# Patient Record
Sex: Male | Born: 1955 | Race: White | Hispanic: No | Marital: Married | Smoking: Former smoker
Health system: Southern US, Community
[De-identification: ages and names within clinical notes are randomized; demographics above are authoritative.]

## PROBLEM LIST (undated history)

## (undated) DIAGNOSIS — I10 Essential (primary) hypertension: Secondary | ICD-10-CM

## (undated) DIAGNOSIS — E785 Hyperlipidemia, unspecified: Secondary | ICD-10-CM

## (undated) DIAGNOSIS — E291 Testicular hypofunction: Secondary | ICD-10-CM

## (undated) DIAGNOSIS — Z87442 Personal history of urinary calculi: Secondary | ICD-10-CM

## (undated) DIAGNOSIS — R7303 Prediabetes: Secondary | ICD-10-CM

## (undated) DIAGNOSIS — C3490 Malignant neoplasm of unspecified part of unspecified bronchus or lung: Secondary | ICD-10-CM

## (undated) DIAGNOSIS — E559 Vitamin D deficiency, unspecified: Secondary | ICD-10-CM

## (undated) HISTORY — DX: Vitamin D deficiency, unspecified: E55.9

## (undated) HISTORY — DX: Prediabetes: R73.03

## (undated) HISTORY — DX: Hyperlipidemia, unspecified: E78.5

## (undated) HISTORY — PX: APPENDECTOMY: SHX54

## (undated) HISTORY — DX: Essential (primary) hypertension: I10

## (undated) HISTORY — DX: Testicular hypofunction: E29.1

---

## 1998-01-03 ENCOUNTER — Ambulatory Visit (HOSPITAL_COMMUNITY): Admission: RE | Admit: 1998-01-03 | Discharge: 1998-01-03 | Payer: Self-pay | Admitting: Internal Medicine

## 1999-10-10 ENCOUNTER — Ambulatory Visit (HOSPITAL_COMMUNITY): Admission: RE | Admit: 1999-10-10 | Discharge: 1999-10-10 | Payer: Self-pay | Admitting: Internal Medicine

## 1999-10-10 ENCOUNTER — Encounter: Payer: Self-pay | Admitting: Internal Medicine

## 2000-10-12 ENCOUNTER — Encounter: Payer: Self-pay | Admitting: Internal Medicine

## 2000-10-12 ENCOUNTER — Ambulatory Visit (HOSPITAL_COMMUNITY): Admission: RE | Admit: 2000-10-12 | Discharge: 2000-10-12 | Payer: Self-pay | Admitting: Family Medicine

## 2001-10-25 ENCOUNTER — Ambulatory Visit (HOSPITAL_COMMUNITY): Admission: RE | Admit: 2001-10-25 | Discharge: 2001-10-25 | Payer: Self-pay | Admitting: Internal Medicine

## 2001-10-25 ENCOUNTER — Encounter: Payer: Self-pay | Admitting: Internal Medicine

## 2002-03-28 ENCOUNTER — Encounter (INDEPENDENT_AMBULATORY_CARE_PROVIDER_SITE_OTHER): Payer: Self-pay | Admitting: *Deleted

## 2002-03-28 ENCOUNTER — Ambulatory Visit (HOSPITAL_COMMUNITY): Admission: RE | Admit: 2002-03-28 | Discharge: 2002-03-28 | Payer: Self-pay | Admitting: Gastroenterology

## 2002-12-20 ENCOUNTER — Encounter: Payer: Self-pay | Admitting: Internal Medicine

## 2002-12-20 ENCOUNTER — Ambulatory Visit (HOSPITAL_COMMUNITY): Admission: AD | Admit: 2002-12-20 | Discharge: 2002-12-20 | Payer: Self-pay | Admitting: Internal Medicine

## 2003-12-26 ENCOUNTER — Ambulatory Visit (HOSPITAL_COMMUNITY): Admission: RE | Admit: 2003-12-26 | Discharge: 2003-12-26 | Payer: Self-pay | Admitting: Internal Medicine

## 2004-12-29 ENCOUNTER — Ambulatory Visit (HOSPITAL_COMMUNITY): Admission: RE | Admit: 2004-12-29 | Discharge: 2004-12-29 | Payer: Self-pay | Admitting: Internal Medicine

## 2006-03-10 ENCOUNTER — Ambulatory Visit (HOSPITAL_COMMUNITY): Admission: RE | Admit: 2006-03-10 | Discharge: 2006-03-10 | Payer: Self-pay | Admitting: Internal Medicine

## 2007-05-31 ENCOUNTER — Ambulatory Visit: Admission: RE | Admit: 2007-05-31 | Discharge: 2007-05-31 | Payer: Self-pay | Admitting: Internal Medicine

## 2007-07-06 ENCOUNTER — Ambulatory Visit: Payer: Self-pay | Admitting: Gastroenterology

## 2007-07-11 LAB — HM COLONOSCOPY

## 2007-07-13 ENCOUNTER — Ambulatory Visit: Payer: Self-pay | Admitting: Gastroenterology

## 2007-08-02 ENCOUNTER — Ambulatory Visit: Payer: Self-pay | Admitting: Gastroenterology

## 2007-08-02 ENCOUNTER — Encounter: Payer: Self-pay | Admitting: Gastroenterology

## 2007-08-02 DIAGNOSIS — D122 Benign neoplasm of ascending colon: Secondary | ICD-10-CM | POA: Insufficient documentation

## 2007-08-10 ENCOUNTER — Ambulatory Visit: Payer: Self-pay | Admitting: Gastroenterology

## 2007-08-10 LAB — CONVERTED CEMR LAB
Fecal Occult Blood: NEGATIVE
OCCULT 4: NEGATIVE

## 2007-08-31 DIAGNOSIS — F17200 Nicotine dependence, unspecified, uncomplicated: Secondary | ICD-10-CM | POA: Insufficient documentation

## 2007-08-31 DIAGNOSIS — Z87898 Personal history of other specified conditions: Secondary | ICD-10-CM | POA: Insufficient documentation

## 2007-08-31 DIAGNOSIS — E782 Mixed hyperlipidemia: Secondary | ICD-10-CM | POA: Insufficient documentation

## 2007-08-31 DIAGNOSIS — N200 Calculus of kidney: Secondary | ICD-10-CM | POA: Insufficient documentation

## 2007-08-31 DIAGNOSIS — F1010 Alcohol abuse, uncomplicated: Secondary | ICD-10-CM | POA: Insufficient documentation

## 2007-08-31 DIAGNOSIS — Z87448 Personal history of other diseases of urinary system: Secondary | ICD-10-CM | POA: Insufficient documentation

## 2007-08-31 DIAGNOSIS — Z872 Personal history of diseases of the skin and subcutaneous tissue: Secondary | ICD-10-CM | POA: Insufficient documentation

## 2008-07-10 ENCOUNTER — Ambulatory Visit: Admission: RE | Admit: 2008-07-10 | Discharge: 2008-07-10 | Payer: Self-pay | Admitting: Internal Medicine

## 2009-09-09 ENCOUNTER — Ambulatory Visit (HOSPITAL_COMMUNITY): Admission: RE | Admit: 2009-09-09 | Discharge: 2009-09-09 | Payer: Self-pay | Admitting: Internal Medicine

## 2010-09-09 ENCOUNTER — Ambulatory Visit (HOSPITAL_COMMUNITY)
Admission: RE | Admit: 2010-09-09 | Discharge: 2010-09-09 | Payer: Self-pay | Source: Home / Self Care | Attending: Internal Medicine | Admitting: Internal Medicine

## 2010-12-23 NOTE — Assessment & Plan Note (Signed)
[facility]                         GASTROENTEROLOGY OFFICE NOTE   Ruben Mccormick, Ruben Mccormick                           MRN:          [id number]  DATE:07/06/2007                            DOB:          1956-06-02    REASON FOR CONSULTATION:  Heme-positive stool.   HISTORY:  Ruben Mccormick is a 55 year old white male who is referred through  the courtesy of Dr. Lucky Cowboy for evaluation.  Routine testing  demonstrated a heme-positive stool.  Ruben Mccormick has no GI complaints,  including change in bowel habits, abdominal pain, melena or  hematochezia.  He is on no gastric irritants including non-steroidals.  He does have a history of an adenomatous polyp which was removed in  2003.   PAST MEDICAL/SURGICAL HISTORY:  1. Pertinent for kidney stones.  2. He is status post appendectomy.  3. Status post herniorrhaphy.   FAMILY HISTORY:  Pertinent for his father with diabetes.   CURRENT MEDICATIONS:  His only medication is penicillin recently for a  toothache.   ALLERGIES:  No known drug allergies.   SOCIAL HISTORY:  He smokes one pack a day.  He drinks two or three times  a week.  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   REVIEW OF SYSTEMS:  Was reviewed and is negative.   PHYSICAL EXAMINATION:  HEENT: EOMI.  PERRLA.  Sclerae are anicteric.  Conjunctivae are pink.  NECK:  Supple without thyromegaly, adenopathy or carotid bruits.  CHEST:  Clear to auscultation and percussion without adventitious  sounds.  CARDIAC:  Regular rhythm; normal S1 S2.  There are no murmurs, gallops  or rubs.  ABDOMEN:  Bowel sounds are normoactive.  Abdomen is soft, nontender and  nondistended.  There are no abdominal masses, tenderness, splenic  enlargement or hepatomegaly.  EXTREMITIES:  Full range of motion.  No cyanosis, clubbing or edema.  RECTAL:  Deferred.   LABORATORY DATA:  On May 31, 2007, hemoglobin was 17, hematocrit  48.9.   IMPRESSION:  1. Heme-positive  stool.  Colonic sources including polyps and neoplasm      and hemorrhoids need to be ruled out.  A proximal gastrointestinal      bleeding source secondary to active peptic ulcer disease and      arteriovenous malformations are other considerations.  2. History of adenomatous colon polyp.   RECOMMENDATION:  Colonoscopy.  If negative, I will obtain follow-up  Hemoccults.     Barbette Hair. Arlyce Dice, MD,FACG  Electronically Signed    RDK/MedQ  DD: 07/06/2007  DT: 07/06/2007  Job #: [id number]   cc:   Lucky Cowboy, M.D.

## 2011-09-14 ENCOUNTER — Ambulatory Visit (HOSPITAL_COMMUNITY)
Admission: RE | Admit: 2011-09-14 | Discharge: 2011-09-14 | Disposition: A | Discharge status: Home / Self Care | Payer: Managed Care, Other (non HMO) | Source: Ambulatory Visit | Attending: Internal Medicine | Admitting: Internal Medicine

## 2011-09-14 ENCOUNTER — Other Ambulatory Visit (HOSPITAL_COMMUNITY): Payer: Self-pay | Admitting: Internal Medicine

## 2011-09-14 DIAGNOSIS — I10 Essential (primary) hypertension: Secondary | ICD-10-CM

## 2011-09-14 DIAGNOSIS — Z0000 Encounter for general adult medical examination without abnormal findings: Secondary | ICD-10-CM | POA: Insufficient documentation

## 2012-07-06 ENCOUNTER — Encounter: Payer: Self-pay | Admitting: Gastroenterology

## 2012-09-15 ENCOUNTER — Other Ambulatory Visit (HOSPITAL_COMMUNITY): Payer: Self-pay | Admitting: Internal Medicine

## 2012-09-15 ENCOUNTER — Ambulatory Visit (HOSPITAL_COMMUNITY)
Admission: RE | Admit: 2012-09-15 | Discharge: 2012-09-15 | Disposition: A | Discharge status: Home / Self Care | Payer: Medicare HMO | Source: Ambulatory Visit | Attending: Internal Medicine | Admitting: Internal Medicine

## 2012-09-15 DIAGNOSIS — I10 Essential (primary) hypertension: Secondary | ICD-10-CM | POA: Insufficient documentation

## 2012-09-15 DIAGNOSIS — J8410 Pulmonary fibrosis, unspecified: Secondary | ICD-10-CM | POA: Insufficient documentation

## 2013-03-07 ENCOUNTER — Encounter: Payer: Self-pay | Admitting: Gastroenterology

## 2013-07-20 ENCOUNTER — Encounter: Payer: Self-pay | Admitting: Internal Medicine

## 2013-07-24 ENCOUNTER — Encounter: Payer: Self-pay | Admitting: Emergency Medicine

## 2013-07-24 ENCOUNTER — Ambulatory Visit (INDEPENDENT_AMBULATORY_CARE_PROVIDER_SITE_OTHER): Payer: Managed Care, Other (non HMO) | Admitting: Emergency Medicine

## 2013-07-24 VITALS — BP 124/80 | HR 78 | Temp 98.6°F | Resp 18 | Wt 168.0 lb

## 2013-07-24 DIAGNOSIS — F17200 Nicotine dependence, unspecified, uncomplicated: Secondary | ICD-10-CM

## 2013-07-24 DIAGNOSIS — E782 Mixed hyperlipidemia: Secondary | ICD-10-CM

## 2013-07-24 DIAGNOSIS — M79609 Pain in unspecified limb: Secondary | ICD-10-CM

## 2013-07-24 DIAGNOSIS — E559 Vitamin D deficiency, unspecified: Secondary | ICD-10-CM

## 2013-07-24 DIAGNOSIS — E291 Testicular hypofunction: Secondary | ICD-10-CM

## 2013-07-24 DIAGNOSIS — Z79899 Other long term (current) drug therapy: Secondary | ICD-10-CM

## 2013-07-24 LAB — CBC WITH DIFFERENTIAL/PLATELET
Eosinophils Absolute: 0.3 10*3/uL (ref 0.0–0.7)
Eosinophils Relative: 3 % (ref 0–5)
Lymphocytes Relative: 24 % (ref 12–46)
Lymphs Abs: 2.5 10*3/uL (ref 0.7–4.0)
MCV: 101.3 fL — ABNORMAL HIGH (ref 78.0–100.0)
Monocytes Relative: 10 % (ref 3–12)
Platelets: 224 10*3/uL (ref 150–400)
RDW: 13.3 % (ref 11.5–15.5)

## 2013-07-24 NOTE — Patient Instructions (Addendum)
Myalgia, Adult Myalgia is the medical term for muscle pain. It is a symptom of many things. Nearly everyone at some time in their life has this. The most common cause for muscle pain is overuse or straining and more so when you are not in shape. Injuries and muscle bruises cause myalgias. Muscle pain without a history of injury can also be caused by a virus. It frequently comes along with the flu. Myalgia not caused by muscle strain can be present in a large number of infectious diseases. Some autoimmune diseases like lupus and fibromyalgia can cause muscle pain. Myalgia may be mild, or severe. SYMPTOMS  The symptoms of myalgia are simply muscle pain. Most of the time this is short lived and the pain goes away without treatment. DIAGNOSIS  Myalgia is diagnosed by your caregiver by taking your history. This means you tell him when the problems began, what they are, and what has been happening. If this has not been a long term problem, your caregiver may want to watch for a while to see what will happen. If it has been long term, they may want to do additional testing. TREATMENT  The treatment depends on what the underlying cause of the muscle pain is. Often anti-inflammatory medications will help. HOME CARE INSTRUCTIONS  If the pain in your muscles came from overuse, slow down your activities until the problems go away.  Myalgia from overuse of a muscle can be treated with alternating hot and cold packs on the muscle affected or with cold for the first couple days. If either heat or cold seems to make things worse, stop their use.  Apply ice to the sore area for 15-20 minutes, 03-04 times per day, while awake for the first 2 days of muscle soreness, or as directed. Put the ice in a plastic bag and place a towel between the bag of ice and your skin.  Only take over-the-counter or prescription medicines for pain, discomfort, or fever as directed by your caregiver.  Regular gentle exercise may help if  you are not active.  Stretching before strenuous exercise can help lower the risk of myalgia. It is normal when beginning an exercise regimen to feel some muscle pain after exercising. Muscles that have not been used frequently will be sore at first. If the pain is extreme, this may mean injury to a muscle. SEEK MEDICAL CARE IF:  You have an increase in muscle pain that is not relieved with medication.  You begin to run a temperature.  You develop nausea and vomiting.  You develop a stiff and painful neck.  You develop a rash.  You develop muscle pain after a tick bite.  You have continued muscle pain while working out even after you are in good condition. SEEK IMMEDIATE MEDICAL CARE IF: Any of your problems are getting worse and medications are not helping. MAKE SURE YOU:   Understand these instructions.  Will watch your condition.  Will get help right away if you are not doing well or get worse. Document Released: 06/18/2006 Document Revised: 10/19/2011 Document Reviewed: 09/07/2006 [facility] Patient Information 2014 [place], [place]. Smoking Cessation, Tips for Success YOU CAN QUIT SMOKING If you are ready to quit smoking, congratulations! You have chosen to help yourself be healthier. Cigarettes bring nicotine, tar, carbon monoxide, and other irritants into your body. Your lungs, heart, and blood vessels will be able to work better without these poisons. There are many different ways to quit smoking. Nicotine gum, nicotine patches, a  nicotine inhaler, or nicotine nasal spray can help with physical craving. Hypnosis, support groups, and medicines help break the habit of smoking. Here are some tips to help you quit for good.  Throw away all cigarettes.  Clean and remove all ashtrays from your home, work, and car.  On a card, write down your reasons for quitting. Carry the card with you and read it when you get the urge to smoke.  Cleanse your body of nicotine. Drink enough  water and fluids to keep your urine clear or pale yellow. Do this after quitting to flush the nicotine from your body.  Learn to predict your moods. Do not let a bad situation be your excuse to have a cigarette. Some situations in your life might tempt you into wanting a cigarette.  Never have "just one" cigarette. It leads to wanting another and another. Remind yourself of your decision to quit.  Change habits associated with smoking. If you smoked while driving or when feeling stressed, try other activities to replace smoking. Stand up when drinking your coffee. Brush your teeth after eating. Sit in a different chair when you read the paper. Avoid alcohol while trying to quit, and try to drink fewer caffeinated beverages. Alcohol and caffeine may urge you to smoke.  Avoid foods and drinks that can trigger a desire to smoke, such as sugary or spicy foods and alcohol.  Ask people who smoke not to smoke around you.  Have something planned to do right after eating or having a cup of coffee. Take a walk or exercise to perk you up. This will help to keep you from overeating.  Try a relaxation exercise to calm you down and decrease your stress. Remember, you may be tense and nervous for the first 2 weeks after you quit, but this will pass.  Find new activities to keep your hands busy. Play with a pen, coin, or rubber band. Doodle or draw things on paper.  Brush your teeth right after eating. This will help cut down on the craving for the taste of tobacco after meals. You can try mouthwash, too.  Use oral substitutes, such as lemon drops, carrots, a cinnamon stick, or chewing gum, in place of cigarettes. Keep them handy so they are available when you have the urge to smoke.  When you have the urge to smoke, try deep breathing.  Designate your home as a nonsmoking area.  If you are a heavy smoker, ask your caregiver about a prescription for nicotine chewing gum. It can ease your withdrawal from  nicotine.  Reward yourself. Set aside the cigarette money you save and buy yourself something nice.  Look for support from others. Join a support group or smoking cessation program. Ask someone at home or at work to help you with your plan to quit smoking.  Always ask yourself, "Do I need this cigarette or is this just a reflex?" Tell yourself, "Today, I choose not to smoke," or "I do not want to smoke." You are reminding yourself of your decision to quit, even if you do smoke a cigarette. HOW WILL I FEEL WHEN I QUIT SMOKING?  The benefits of not smoking start within days of quitting.  You may have symptoms of withdrawal because your body is used to nicotine (the addictive substance in cigarettes). You may crave cigarettes, be irritable, feel very hungry, cough often, get headaches, or have difficulty concentrating.  The withdrawal symptoms are only temporary. They are strongest when you first quit  but will go away within 10 to 14 days.  When withdrawal symptoms occur, stay in control. Think about your reasons for quitting. Remind yourself that these are signs that your body is healing and getting used to being without cigarettes.  Remember that withdrawal symptoms are easier to treat than the major diseases that smoking can cause.  Even after the withdrawal is over, expect periodic urges to smoke. However, these cravings are generally short-lived and will go away whether you smoke or not. Do not smoke!  If you relapse and smoke again, do not lose hope. Most smokers quit 3 times before they are successful.  If you relapse, do not give up! Plan ahead and think about what you will do the next time you get the urge to smoke. LIFE AS A NONSMOKER: MAKE IT FOR A MONTH, MAKE IT FOR LIFE Day 1: Hang this page where you will see it every day. Day 2: Get rid of all ashtrays, matches, and lighters. Day 3: Drink water. Breathe deeply between sips. Day 4: Avoid places with smoke-filled air, such as  bars, clubs, or the smoking section of restaurants. Day 5: Keep track of how much money you save by not smoking. Day 6: Avoid boredom. Keep a good book with you or go to the movies. Day 7: Reward yourself! One week without smoking! Day 8: Make a dental appointment to get your teeth cleaned. Day 9: Decide how you will turn down a cigarette before it is offered to you. Day 10: Review your reasons for quitting. Day 11: Distract yourself. Stay active to keep your mind off smoking and to relieve tension. Take a walk, exercise, read a book, do a crossword puzzle, or try a new hobby. Day 12: Exercise. Get off the bus before your stop or use stairs instead of escalators. Day 13: Call on friends for support and encouragement. Day 14: Reward yourself! Two weeks without smoking! Day 15: Practice deep breathing exercises. Day 16: Bet a friend that you can stay a nonsmoker. Day 17: Ask to sit in nonsmoking sections of restaurants. Day 18: Hang up "No Smoking" signs. Day 19: Think of yourself as a nonsmoker. Day 20: Each morning, tell yourself you will not smoke. Day 21: Reward yourself! Three weeks without smoking! Day 22: Think of smoking in negative ways. Remember how it stains your teeth, gives you bad breath, and leaves you short of breath. Day 23: Eat a nutritious breakfast. Day 24:Do not relive your days as a smoker. Day 25: Hold a pencil in your hand when talking on the telephone. Day 26: Tell all your friends you do not smoke. Day 27: Think about how much better food tastes. Day 28: Remember, one cigarette is one too many. Day 29: Take up a hobby that will keep your hands busy. Day 30: Congratulations! One month without smoking! Give yourself a big reward. Your caregiver can direct you to community resources or hospitals for support, which may include:  Group support.  Education.  Hypnosis.  Subliminal therapy. Document Released: 04/24/2004 Document Revised: 10/19/2011 Document  Reviewed: 01/12/2013 [facility] Patient Information 2014 [place], [place].

## 2013-07-25 LAB — IRON AND TIBC
%SAT: 25 % (ref 20–55)
Iron: 91 ug/dL (ref 42–165)
UIBC: 267 ug/dL (ref 125–400)

## 2013-07-25 LAB — HEPATIC FUNCTION PANEL
ALT: 40 U/L (ref 0–53)
AST: 65 U/L — ABNORMAL HIGH (ref 0–37)
Alkaline Phosphatase: 71 U/L (ref 39–117)
Total Bilirubin: 0.5 mg/dL (ref 0.3–1.2)
Total Protein: 5.9 g/dL — ABNORMAL LOW (ref 6.0–8.3)

## 2013-07-25 LAB — LIPID PANEL
HDL: 74 mg/dL (ref >39)
LDL Cholesterol: 34 mg/dL (ref 0–99)
VLDL: 32 mg/dL (ref 0–40)

## 2013-07-25 LAB — BASIC METABOLIC PANEL WITH GFR
Creat: 0.92 mg/dL (ref 0.50–1.35)
GFR, Est Non African American: >89 mL/min
Sodium: 140 mEq/L (ref 135–145)

## 2013-07-25 LAB — TESTOSTERONE: Testosterone: 1221 ng/dL — ABNORMAL HIGH (ref 300–890)

## 2013-07-25 NOTE — Progress Notes (Signed)
   Subjective:    Patient ID: Ruben Mccormick, male    DOB: 02/23/1956, 57 y.o.   MRN: [id number]  HPI Comments: 57 yo male for cholesterol/ hypogonadism f/u. He has been eating healthier and decreasing ETOH consumption. He would like to d/c Pravastatin if labs WNL. He notes he occasionally has anterior leg pain off thighs but usually shins. He denies any trauma. He notes pain resolves on its own with some stretching. He keeps active for exercise. ABN LAB TG 355 rest were WNL at last OV.  He is still smoking cigarettes, he has decreased from 3 to 1 ppd. He notes he did not try recommendations given in past.        Hyperlipidemia Associated symptoms include myalgias.     Current Outpatient Prescriptions on File Prior to Visit  Medication Sig Dispense Refill  . aspirin 325 MG tablet Take 325 mg by mouth daily.      . Cholecalciferol (VITAMIN D-3) 5000 UNITS TABS Take 8,000 Units by mouth daily.      . pravastatin (PRAVACHOL) 40 MG tablet Take 20 mg by mouth daily.      Marland Kitchen testosterone cypionate (DEPOTESTOTERONE CYPIONATE) 100 MG/ML injection Inject 200 mg into the muscle every 14 (fourteen) days. For IM use only       No current facility-administered medications on file prior to visit.   ALLERGIES Bee venom; Ppd; and Voltaren  Past Medical History  Diagnosis Date  . Hypertension   . Hyperlipidemia   . Pre-diabetes   . Hypogonadism male   . Vitamin D deficiency     Review of Systems  Musculoskeletal: Positive for myalgias.  All other systems reviewed and are negative.    BP 124/80  Pulse 78  Temp(Src) 98.6 F (37 C) (Temporal)  Resp 18  Wt 168 lb (76.204 kg)     Objective:   Physical Exam  Nursing note and vitals reviewed. Constitutional: He is oriented to person, place, and time. He appears well-developed and well-nourished.  HENT:  Head: Normocephalic and atraumatic.  Right Ear: External ear normal.  Left Ear: External ear normal.  Nose: Nose normal.  Eyes:  Conjunctivae and EOM are normal.  Neck: Normal range of motion. Neck supple. No JVD present. No thyromegaly present.  Cardiovascular: Normal rate, regular rhythm, normal heart sounds and intact distal pulses.   Pulmonary/Chest: Effort normal and breath sounds normal.  Abdominal: Soft. Bowel sounds are normal. He exhibits no distension and no mass. There is no tenderness. There is no rebound and no guarding.  Musculoskeletal: Normal range of motion. He exhibits no edema and no tenderness.  Lymphadenopathy:    He has no cervical adenopathy.  Neurological: He is alert and oriented to person, place, and time. He has normal reflexes. No cranial nerve deficit. Coordination normal.  Skin: Skin is warm and dry.  Psychiatric: He has a normal mood and affect. His behavior is normal. Judgment and thought content normal.          Assessment & Plan:  1. Cholesterol/ hypogonadism/ Vit D deficiency- Check labs 2. Myalgias with Cholesterol RX- check labs, will conside d/c RX if labs still WNL and try dietary control 3. Tobacco dependence- counseling given, w/c if wants rx

## 2013-08-03 ENCOUNTER — Other Ambulatory Visit: Payer: Self-pay | Admitting: Internal Medicine

## 2013-09-21 ENCOUNTER — Ambulatory Visit (INDEPENDENT_AMBULATORY_CARE_PROVIDER_SITE_OTHER): Payer: Medicare HMO | Admitting: Internal Medicine

## 2013-09-21 ENCOUNTER — Encounter: Payer: Self-pay | Admitting: Internal Medicine

## 2013-09-21 VITALS — BP 132/72 | HR 84 | Temp 98.1°F | Resp 18 | Ht 67.5 in | Wt 164.2 lb

## 2013-09-21 DIAGNOSIS — Z113 Encounter for screening for infections with a predominantly sexual mode of transmission: Secondary | ICD-10-CM

## 2013-09-21 DIAGNOSIS — Z0000 Encounter for general adult medical examination without abnormal findings: Secondary | ICD-10-CM

## 2013-09-21 DIAGNOSIS — E559 Vitamin D deficiency, unspecified: Secondary | ICD-10-CM

## 2013-09-21 DIAGNOSIS — E785 Hyperlipidemia, unspecified: Secondary | ICD-10-CM

## 2013-09-21 DIAGNOSIS — R0989 Other specified symptoms and signs involving the circulatory and respiratory systems: Secondary | ICD-10-CM | POA: Insufficient documentation

## 2013-09-21 DIAGNOSIS — E349 Endocrine disorder, unspecified: Secondary | ICD-10-CM | POA: Insufficient documentation

## 2013-09-21 DIAGNOSIS — Z1212 Encounter for screening for malignant neoplasm of rectum: Secondary | ICD-10-CM

## 2013-09-21 DIAGNOSIS — R7303 Prediabetes: Secondary | ICD-10-CM | POA: Insufficient documentation

## 2013-09-21 DIAGNOSIS — I10 Essential (primary) hypertension: Secondary | ICD-10-CM

## 2013-09-21 DIAGNOSIS — R7402 Elevation of levels of lactic acid dehydrogenase (LDH): Secondary | ICD-10-CM

## 2013-09-21 DIAGNOSIS — Z79899 Other long term (current) drug therapy: Secondary | ICD-10-CM

## 2013-09-21 DIAGNOSIS — R740 Nonspecific elevation of levels of transaminase and lactic acid dehydrogenase [LDH]: Secondary | ICD-10-CM

## 2013-09-21 DIAGNOSIS — Z125 Encounter for screening for malignant neoplasm of prostate: Secondary | ICD-10-CM

## 2013-09-21 LAB — CBC WITH DIFFERENTIAL/PLATELET
Basophils Absolute: 0 10*3/uL (ref 0.0–0.1)
Basophils Relative: 0 % (ref 0–1)
EOS ABS: 0.3 10*3/uL (ref 0.0–0.7)
Eosinophils Relative: 3 % (ref 0–5)
HEMATOCRIT: 47.4 % (ref 39.0–52.0)
Hemoglobin: 16 g/dL (ref 13.0–17.0)
LYMPHS PCT: 27 % (ref 12–46)
Lymphs Abs: 2.1 10*3/uL (ref 0.7–4.0)
MCH: 33.1 pg (ref 26.0–34.0)
MCHC: 33.8 g/dL (ref 30.0–36.0)
MCV: 97.9 fL (ref 78.0–100.0)
MONOS PCT: 8 % (ref 3–12)
Monocytes Absolute: 0.6 10*3/uL (ref 0.1–1.0)
NEUTROS ABS: 4.7 10*3/uL (ref 1.7–7.7)
NEUTROS PCT: 62 % (ref 43–77)
PLATELETS: 221 10*3/uL (ref 150–400)
RBC: 4.84 MIL/uL (ref 4.22–5.81)
RDW: 13.3 % (ref 11.5–15.5)
WBC: 7.7 10*3/uL (ref 4.0–10.5)

## 2013-09-21 MED ORDER — PREDNISONE 20 MG PO TABS: 20.0000 mg | ORAL_TABLET | ORAL | Status: DC

## 2013-09-21 NOTE — Progress Notes (Signed)
Patient ID: Ruben Mccormick, male   DOB: 02/14/1956, 58 y.o.   MRN: [id number]  Annual Screening Comprehensive Examination  This very nice 58 y.o.  MWM presents for complete physical.  Patient has been followed for labile HTN, Diabetes  Prediabetes, Hyperlipidemia, and Vitamin D Deficiency.   Labile HTN predates since 2005 and for which he has been monitored expectantly with BP's remaining in normal range. Patient's BP has been controlled at home.Today's BP: 132/72 mmHg. Patient denies any cardiac symptoms as chest pain, palpitations, shortness of breath, dizziness or ankle swelling.   Patient's hyperlipidemia is controlled with diet and in the last 6 months he d/c'd his Pravastatin due to severe fatigue , asthenia, and myalgias.  Last cholesterol last visit was 161, triglycerides 355, HDL 75 and LDL 15 when on treatment.     Patient has prediabetes with A1c of 5.9% in Aug 2011 with last A1c 5.6% in June 2014. Patient denies reactive hypoglycemic symptoms, visual blurring, diabetic polys, or paresthesias. He does have Testosterone Deficiency with Level of 188.33 in Aug 2012 and has been on replacement Therapy since.   Finally, patient has history of Vitamin D Deficiency of 27 in 2009 and with last vitamin D 63 in June 2014.    Medication List      He aspirin 325 MG tablet  Take 325 mg by mouth daily.     predniSONE 20 MG tablet  Commonly known as:  DELTASONE  Take 1 tablet (20 mg total) by mouth See admin instructions. 1 tab 3 x day for 3 days, then 1 tab 2 x day for 3 days, then 1 tab 1 x day for 5 days     testosterone cypionate 200 MG/ML injection  Commonly known as:  DEPOTESTOTERONE CYPIONATE  INJECT 2 ML IN THE MUSCLE EVERY 2 WEEKS     Vitamin D-3 5000 UNITS Tabs  Take 5,000 Units by mouth daily.        Allergies  Allergen Reactions  . Bee Venom Anaphylaxis  . 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 Voltaren [Diclofenac Sodium] Rash    Past Medical History  Diagnosis  Date  . Hypertension   . Hyperlipidemia   . Pre-diabetes   . Hypogonadism male   . Vitamin D deficiency     No past surgical history on file.  Family History  Problem Relation Age of Onset  . Alzheimer's disease Mother   . Parkinson's disease Mother   . Diabetes Father   . Cancer Paternal Grandfather     colon    History   Social History  . Marital Status: Married    Spouse Name: N/A    Number of Children: N/A  . Years of Education: N/A   Occupational History  . Not on file.   Social History Main Topics  . Smoking status: Current Every Day Smoker -- 1.50 packs/day  . Smokeless tobacco: Never Used  . Alcohol Use: 7.0 oz/week    14 drink(s) per week  . Drug Use: No  . Sexual Activity: Not on file   Other Topics Concern  . Not on file   Social History Narrative  . No narrative on file    ROS Constitutional: Denies fever, chills, weight loss/gain, headaches, insomnia, fatigue, night sweats, and change in appetite. Eyes: Denies redness, blurred vision, diplopia, discharge, itchy, watery eyes.  ENT: Denies discharge, congestion, post nasal drip, epistaxis, sore throat, earache, hearing loss, dental pain, Tinnitus, Vertigo, Sinus pain, snoring.  Cardio: Denies chest  pain, palpitations, irregular heartbeat, syncope, dyspnea, diaphoresis, orthopnea, PND, claudication, edema Respiratory: denies cough, dyspnea, DOE, pleurisy, hoarseness, laryngitis, wheezing.  Gastrointestinal: Denies dysphagia, heartburn, reflux, water brash, pain, cramps, nausea, vomiting, bloating, diarrhea, constipation, hematemesis, melena, hematochezia, jaundice, hemorrhoids Genitourinary: Denies dysuria, frequency, urgency, nocturia, hesitancy, discharge, hematuria, flank pain Musculoskeletal: Denies myalgia, stiffness, Jt. Swelling, and strain/sprain. Does c/o Bilat shoulder pains exacerbated by physical activity and not benefited by NSAID's in the past. Skin: Denies puritis, rash, hives, warts,  acne, eczema, changing in skin lesion Neuro: No weakness, tremor, incoordination, spasms, paresthesia, pain Psychiatric: Denies confusion, memory loss, sensory loss Endocrine: Denies change in weight, skin, hair change, nocturia, and paresthesia, diabetic polys, visual blurring, hyper / hypo glycemic episodes.  Heme/Lymph: No excessive bleeding, bruising, or elarged lymph nodes.  BP: 132/72  Pulse: 84  Temp: 98.1 F (36.7 C)  Resp: 18    Estimated body mass index is 25.32 kg/(m^2) as calculated from the following:   Height as of this encounter: 5' 7.5" (1.715 m).   Weight as of this encounter: 164 lb 3.2 oz (74.481 kg).  Physical Exam General Appearance: Well nourished, in no apparent distress. Eyes: PERRLA, EOMs, conjunctiva no swelling or erythema, normal fundi and vessels. Sinuses: No frontal/maxillary tenderness ENT/Mouth: EACs patent / TMs  nl. Nares clear without erythema, swelling, mucoid exudates. Oral hygiene is good. No erythema, swelling, or exudate. Tongue normal, non-obstructing. Tonsils not swollen or erythematous. Hearing normal.  Neck: Supple, thyroid normal. No bruits, nodes or JVD. Respiratory: Respiratory effort normal.  BS equal and clear bilateral without rales, rhonci, wheezing or stridor. Cardio: Heart sounds are normal with regular rate and rhythm and no murmurs, rubs or gallops. Peripheral pulses are normal and equal bilaterally without edema. No aortic or femoral bruits. Chest: symmetric with normal excursions and percussion.  Abdomen: Flat, soft, with bowl sounds. Nontender, no guarding, rebound, hernias, masses, or organomegaly.  Lymphatics: Non tender without lymphadenopathy.  Genitourinary: No hernias.Testes nl. DRE - prostate nl for age - smooth & firm w/o nodules. Musculoskeletal: Full ROM all peripheral extremities, joint stability, 5/5 strength, and normal gait. Skin: Warm and dry without rashes, lesions, cyanosis, clubbing or  ecchymosis.  Neuro:  Cranial nerves intact, reflexes equal bilaterally. Normal muscle tone, no cerebellar symptoms. Sensation intact.  Pysch: Awake and oriented X 3, normal affect, insight and judgment appropriate.   Assessment and Plan  1. Annual Screening Examination 2. Hypertension  3. Hyperlipidemia 4. Pre Diabetes 5. Vitamin D Deficiency 6. DJD, shoulders  Continue prudent diet as discussed, weight control, BP monitoring, regular exercise, and medications as discussed.  Discussed med effects and SE's. Routine screening labs and tests as requested with regular follow-up as recommended.

## 2013-09-21 NOTE — Patient Instructions (Signed)
Testosterone injection What is this medicine? TESTOSTERONE (tes TOS ter one) is the main male hormone. It supports normal male development such as muscle growth, facial hair, and deep voice. It is used in males to treat low testosterone levels. This medicine may be used for other purposes; ask your health care provider or pharmacist if you have questions. COMMON BRAND NAME(S): Andro-L.A., Aveed, Delatestryl, Depo-Testosterone, Virilon What should I tell my health care provider before I take this medicine? They need to know if you have any of these conditions: -breast cancer -diabetes -heart disease -kidney disease -liver disease -lung disease -prostate cancer, enlargement -an unusual or allergic reaction to testosterone, other medicines, foods, dyes, or preservatives -pregnant or trying to get pregnant -breast-feeding How should I use this medicine? This medicine is for injection into a muscle. It is usually given by a health care professional in a hospital or clinic setting. Contact your pediatrician regarding the use of this medicine in children. While this medicine may be prescribed for children as young as 29 years of age for selected conditions, precautions do apply. Overdosage: If you think you have taken too much of this medicine contact a poison control center or emergency room at once. NOTE: This medicine is only for you. Do not share this medicine with others. What if I miss a dose? Try not to miss a dose. Your doctor or health care professional will tell you when your next injection is due. Notify the office if you are unable to keep an appointment. What may interact with this medicine? -medicines for diabetes -medicines that treat or prevent blood clots like warfarin -oxyphenbutazone -propranolol -steroid medicines like prednisone or cortisone This list may not describe all possible interactions. Give your health care provider a list of all the medicines, herbs,  non-prescription drugs, or dietary supplements you use. Also tell them if you smoke, drink alcohol, or use illegal drugs. Some items may interact with your medicine. What should I watch for while using this medicine? Visit your doctor or health care professional for regular checks on your progress. They will need to check the level of testosterone in your blood. This medicine may affect blood sugar levels. If you have diabetes, check with your doctor or health care professional before you change your diet or the dose of your diabetic medicine. This drug is banned from use in athletes by most athletic organizations. What side effects may I notice from receiving this medicine? Side effects that you should report to your doctor or health care professional as soon as possible: -allergic reactions like skin rash, itching or hives, swelling of the face, lips, or tongue -breast enlargement -breathing problems -changes in mood, especially anger, depression, or rage -dark urine -general ill feeling or flu-like symptoms -light-colored stools -loss of appetite, nausea -nausea, vomiting -right upper belly pain -stomach pain -swelling of ankles -too frequent or persistent erections -trouble passing urine or change in the amount of urine -unusually weak or tired -yellowing of the eyes or skin Additional side effects that can occur in women include: -deep or hoarse voice -facial hair growth -irregular menstrual periods Side effects that usually do not require medical attention (report to your doctor or health care professional if they continue or are bothersome): -acne -change in sex drive or performance -hair loss -headache This list may not describe all possible side effects. Call your doctor for medical advice about side effects. You may report side effects to FDA at 1-800-FDA-1088. Where should I keep my medicine?  Keep out of the reach of children. This medicine can be abused. Keep your  medicine in a safe place to protect it from theft. Do not share this medicine with anyone. Selling or giving away this medicine is dangerous and against the law. Store at room temperature between 20 and 25 degrees C (68 and 77 degrees F). Do not freeze. Protect from light. Follow the directions for the product you are prescribed. Throw away any unused medicine after the expiration date. NOTE: This sheet is a summary. It may not cover all possible information. If you have questions about this medicine, talk to your doctor, pharmacist, or health care provider.  2014, Elsevier/Gold Standard. (2007-10-07 16:13:46)  Hypertension As your heart beats, it forces blood through your arteries. This force is your blood pressure. If the pressure is too high, it is called hypertension (HTN) or high blood pressure. HTN is dangerous because you may have it and not know it. High blood pressure may mean that your heart has to work harder to pump blood. Your arteries may be narrow or stiff. The extra work puts you at risk for heart disease, stroke, and other problems.  Blood pressure consists of two numbers, a higher number over a lower, 110/72, for example. It is stated as "110 over 72." The ideal is below 120 for the top number (systolic) and under 80 for the bottom (diastolic). Write down your blood pressure today. You should pay close attention to your blood pressure if you have certain conditions such as:  Heart failure.  Prior heart attack.  Diabetes  Chronic kidney disease.  Prior stroke.  Multiple risk factors for heart disease. To see if you have HTN, your blood pressure should be measured while you are seated with your arm held at the level of the heart. It should be measured at least twice. A one-time elevated blood pressure reading (especially in the Emergency Department) does not mean that you need treatment. There may be conditions in which the blood pressure is different between your right and left  arms. It is important to see your caregiver soon for a recheck. Most people have essential hypertension which means that there is not a specific cause. This type of high blood pressure may be lowered by changing lifestyle factors such as:  Stress.  Smoking.  Lack of exercise.  Excessive weight.  Drug/tobacco/alcohol use.  Eating less salt. Most people do not have symptoms from high blood pressure until it has caused damage to the body. Effective treatment can often prevent, delay or reduce that damage. TREATMENT  When a cause has been identified, treatment for high blood pressure is directed at the cause. There are a large number of medications to treat HTN. These fall into several categories, and your caregiver will help you select the medicines that are best for you. Medications may have side effects. You should review side effects with your caregiver. If your blood pressure stays high after you have made lifestyle changes or started on medicines,   Your medication(s) may need to be changed.  Other problems may need to be addressed.  Be certain you understand your prescriptions, and know how and when to take your medicine.  Be sure to follow up with your caregiver within the time frame advised (usually within two weeks) to have your blood pressure rechecked and to review your medications.  If you are taking more than one medicine to lower your blood pressure, make sure you know how and at what times  they should be taken. Taking two medicines at the same time can result in blood pressure that is too low. SEEK IMMEDIATE MEDICAL CARE IF:  You develop a severe headache, blurred or changing vision, or confusion.  You have unusual weakness or numbness, or a faint feeling.  You have severe chest or abdominal pain, vomiting, or breathing problems. MAKE SURE YOU:   Understand these instructions.  Will watch your condition.  Will get help right away if you are not doing well or get  worse.   Diabetes and Exercise Exercising regularly is important. It is not just about losing weight. It has many health benefits, such as:  Improving your overall fitness, flexibility, and endurance.  Increasing your bone density.  Helping with weight control.  Decreasing your body fat.  Increasing your muscle strength.  Reducing stress and tension.  Improving your overall health. People with diabetes who exercise gain additional benefits because exercise:  Reduces appetite.  Improves the body's use of blood sugar (glucose).  Helps lower or control blood glucose.  Decreases blood pressure.  Helps control blood lipids (such as cholesterol and triglycerides).  Improves the body's use of the hormone insulin by:  Increasing the body's insulin sensitivity.  Reducing the body's insulin needs.  Decreases the risk for heart disease because exercising:  Lowers cholesterol and triglycerides levels.  Increases the levels of good cholesterol (such as high-density lipoproteins [HDL]) in the body.  Lowers blood glucose levels. YOUR ACTIVITY PLAN  Choose an activity that you enjoy and set realistic goals. Your health care provider or diabetes educator can help you make an activity plan that works for you. You can break activities into 2 or 3 sessions throughout the day. Doing so is as good as one long session. Exercise ideas include:  Taking the dog for a walk.  Taking the stairs instead of the elevator.  Dancing to your favorite song.  Doing your favorite exercise with a friend. RECOMMENDATIONS FOR EXERCISING WITH TYPE 1 OR TYPE 2 DIABETES   Check your blood glucose before exercising. If blood glucose levels are greater than 240 mg/dL, check for urine ketones. Do not exercise if ketones are present.  Avoid injecting insulin into areas of the body that are going to be exercised. For example, avoid injecting insulin into:  The arms when playing tennis.  The legs when  jogging.  Keep a record of:  Food intake before and after you exercise.  Expected peak times of insulin action.  Blood glucose levels before and after you exercise.  The type and amount of exercise you have done.  Review your records with your health care provider. Your health care provider will help you to develop guidelines for adjusting food intake and insulin amounts before and after exercising.  If you take insulin or oral hypoglycemic agents, watch for signs and symptoms of hypoglycemia. They include:  Dizziness.  Shaking.  Sweating.  Chills.  Confusion.  Drink plenty of water while you exercise to prevent dehydration or heat stroke. Body water is lost during exercise and must be replaced.  Talk to your health care provider before starting an exercise program to make sure it is safe for you. Remember, almost any type of activity is better than none.    Cholesterol Cholesterol is a white, waxy, fat-like protein needed by your body in small amounts. The liver makes all the cholesterol you need. It is carried from the liver by the blood through the blood vessels. Deposits (plaque) may build  up on blood vessel walls. This makes the arteries narrower and stiffer. Plaque increases the risk for heart attack and stroke. You cannot feel your cholesterol level even if it is very high. The only way to know is by a blood test to check your lipid (fats) levels. Once you know your cholesterol levels, you should keep a record of the test results. Work with your caregiver to to keep your levels in the desired range. WHAT THE RESULTS MEAN:  Total cholesterol is a rough measure of all the cholesterol in your blood.  LDL is the so-called bad cholesterol. This is the type that deposits cholesterol in the walls of the arteries. You want this level to be low.  HDL is the good cholesterol because it cleans the arteries and carries the LDL away. You want this level to be high.  Triglycerides  are fat that the body can either burn for energy or store. High levels are closely linked to heart disease. DESIRED LEVELS:  Total cholesterol below 200.  LDL below 100 for people at risk, below 70 for very high risk.  HDL above 50 is good, above 60 is best.  Triglycerides below 150. HOW TO LOWER YOUR CHOLESTEROL:  Diet.  Choose fish or white meat chicken and [place], roasted or baked. Limit fatty cuts of red meat, fried foods, and processed meats, such as sausage and lunch meat.  Eat lots of fresh fruits and vegetables. Choose whole grains, beans, pasta, potatoes and cereals.  Use only small amounts of olive, corn or canola oils. Avoid butter, mayonnaise, shortening or palm kernel oils. Avoid foods with trans-fats.  Use skim/nonfat milk and low-fat/nonfat yogurt and cheeses. Avoid whole milk, cream, ice cream, egg yolks and cheeses. Healthy desserts include angel food cake, ginger snaps, animal crackers, hard candy, popsicles, and low-fat/nonfat frozen yogurt. Avoid pastries, cakes, pies and cookies.  Exercise.  A regular program helps decrease LDL and raises HDL.  Helps with weight control.  Do things that increase your activity level like gardening, walking, or taking the stairs.  Medication.  May be prescribed by your caregiver to help lowering cholesterol and the risk for heart disease.  You may need medicine even if your levels are normal if you have several risk factors. HOME CARE INSTRUCTIONS   Follow your diet and exercise programs as suggested by your caregiver.  Take medications as directed.  Have blood work done when your caregiver feels it is necessary. MAKE SURE YOU:   Understand these instructions.  Will watch your condition.  Will get help right away if you are not doing well or get worse.      Vitamin D Deficiency Vitamin D is an important vitamin that your body needs. Having too little of it in your body is called a deficiency. A very bad  deficiency can make your bones soft and can cause a condition called rickets.  Vitamin D is important to your body for different reasons, such as:   It helps your body absorb 2 minerals called calcium and phosphorus.  It helps make your bones healthy.  It may prevent some diseases, such as diabetes and multiple sclerosis.  It helps your muscles and heart. You can get vitamin D in several ways. It is a natural part of some foods. The vitamin is also added to some dairy products and cereals. Some people take vitamin D supplements. Also, your body makes vitamin D when you are in the sun. It changes the sun's rays into a  form of the vitamin that your body can use. CAUSES   Not eating enough foods that contain vitamin D.  Not getting enough sunlight.  Having certain digestive system diseases that make it hard to absorb vitamin D. These diseases include Crohn's disease, chronic pancreatitis, and cystic fibrosis.  Having a surgery in which part of the stomach or small intestine is removed.  Being obese. Fat cells pull vitamin D out of your blood. That means that obese people may not have enough vitamin D left in their blood and in other body tissues.  Having chronic kidney or liver disease. RISK FACTORS Risk factors are things that make you more likely to develop a vitamin D deficiency. They include:  Being older.  Not being able to get outside very much.  Living in a nursing home.  Having had broken bones.  Having weak or thin bones (osteoporosis).  Having a disease or condition that changes how your body absorbs vitamin D.  Having dark skin.  Some medicines such as seizure medicines or steroids.  Being overweight or obese. SYMPTOMS Mild cases of vitamin D deficiency may not have any symptoms. If you have a very bad case, symptoms may include:  Bone pain.  Muscle pain.  Falling often.  Broken bones caused by a minor injury, due to osteoporosis. DIAGNOSIS A blood test  is the best way to tell if you have a vitamin D deficiency. TREATMENT Vitamin D deficiency can be treated in different ways. Treatment for vitamin D deficiency depends on what is causing it. Options include:  Taking vitamin D supplements.  Taking a calcium supplement. Your caregiver will suggest what dose is best for you. HOME CARE INSTRUCTIONS  Take any supplements that your caregiver prescribes. Follow the directions carefully. Take only the suggested amount.  Have your blood tested 2 months after you start taking supplements.  Eat foods that contain vitamin D. Healthy choices include:  Fortified dairy products, cereals, or juices. Fortified means vitamin D has been added to the food. Check the label on the package to be sure.  Fatty fish like salmon or trout.  Eggs.  Oysters.  Do not use a tanning bed.  Keep your weight at a healthy level. Lose weight if you need to.  Keep all follow-up appointments. Your caregiver will need to perform blood tests to make sure your vitamin D deficiency is going away. SEEK MEDICAL CARE IF:  You have any questions about your treatment.  You continue to have symptoms of vitamin D deficiency.  You have nausea or vomiting.  You are constipated.  You feel confused.  You have severe abdominal or back pain. MAKE SURE YOU:  Understand these instructions.  Will watch your condition.  Will get help right away if you are not doing well or get worse.

## 2013-09-22 LAB — HEPATIC FUNCTION PANEL
ALT: 29 U/L (ref 0–53)
AST: 34 U/L (ref 0–37)
Albumin: 4.3 g/dL (ref 3.5–5.2)
Alkaline Phosphatase: 73 U/L (ref 39–117)
BILIRUBIN DIRECT: 0.1 mg/dL (ref 0.0–0.3)
Indirect Bilirubin: 0.2 mg/dL (ref 0.2–1.2)
Total Bilirubin: 0.3 mg/dL (ref 0.2–1.2)
Total Protein: 6.3 g/dL (ref 6.0–8.3)

## 2013-09-22 LAB — RPR

## 2013-09-22 LAB — MAGNESIUM: MAGNESIUM: 2 mg/dL (ref 1.5–2.5)

## 2013-09-22 LAB — BASIC METABOLIC PANEL WITH GFR
BUN: 15 mg/dL (ref 6–23)
CO2: 27 meq/L (ref 19–32)
CREATININE: 0.95 mg/dL (ref 0.50–1.35)
Calcium: 9.2 mg/dL (ref 8.4–10.5)
Chloride: 103 mEq/L (ref 96–112)
GFR, EST NON AFRICAN AMERICAN: 87 mL/min
Glucose, Bld: 95 mg/dL (ref 70–99)
POTASSIUM: 4.5 meq/L (ref 3.5–5.3)
SODIUM: 139 meq/L (ref 135–145)

## 2013-09-22 LAB — URINALYSIS, MICROSCOPIC ONLY
BACTERIA UA: NONE SEEN
CASTS: NONE SEEN
CRYSTALS: NONE SEEN
Squamous Epithelial / LPF: NONE SEEN

## 2013-09-22 LAB — VITAMIN B12: Vitamin B-12: 355 pg/mL (ref 211–911)

## 2013-09-22 LAB — HEPATITIS A ANTIBODY, TOTAL: Hep A Total Ab: NONREACTIVE

## 2013-09-22 LAB — TESTOSTERONE: Testosterone: 204 ng/dL — ABNORMAL LOW (ref 300–890)

## 2013-09-22 LAB — HEPATITIS B CORE ANTIBODY, TOTAL: HEP B C TOTAL AB: REACTIVE — AB

## 2013-09-22 LAB — VITAMIN D 25 HYDROXY (VIT D DEFICIENCY, FRACTURES): Vit D, 25-Hydroxy: 88 ng/mL (ref 30–89)

## 2013-09-22 LAB — TSH: TSH: 1.091 u[IU]/mL (ref 0.350–4.500)

## 2013-09-22 LAB — HIV ANTIBODY (ROUTINE TESTING W REFLEX): HIV: NONREACTIVE

## 2013-09-22 LAB — LIPID PANEL
Cholesterol: 183 mg/dL (ref 0–200)
HDL: 90 mg/dL (ref >39)
LDL CALC: 46 mg/dL (ref 0–99)
Total CHOL/HDL Ratio: 2 Ratio
Triglycerides: 233 mg/dL — ABNORMAL HIGH (ref <150)
VLDL: 47 mg/dL — AB (ref 0–40)

## 2013-09-22 LAB — HEPATITIS B SURFACE ANTIBODY,QUALITATIVE: HEP B S AB: POSITIVE — AB

## 2013-09-22 LAB — HEPATITIS C ANTIBODY: HCV Ab: NEGATIVE

## 2013-09-22 LAB — MICROALBUMIN / CREATININE URINE RATIO
CREATININE, URINE: 167.9 mg/dL
MICROALB UR: 2.23 mg/dL — AB (ref 0.00–1.89)
MICROALB/CREAT RATIO: 13.3 mg/g (ref 0.0–30.0)

## 2013-09-22 LAB — PSA: PSA: 0.73 ng/mL (ref <=4.00)

## 2013-09-22 LAB — HEMOGLOBIN A1C
Hgb A1c MFr Bld: 5.6 % (ref <5.7)
Mean Plasma Glucose: 114 mg/dL (ref <117)

## 2013-09-22 LAB — INSULIN, FASTING: INSULIN FASTING, SERUM: 12 u[IU]/mL (ref 3–28)

## 2013-09-28 LAB — HEPATITIS B E ANTIBODY: Hepatitis Be Antibody: NEGATIVE

## 2013-10-04 ENCOUNTER — Other Ambulatory Visit: Payer: Self-pay | Admitting: Internal Medicine

## 2013-10-04 ENCOUNTER — Ambulatory Visit (HOSPITAL_COMMUNITY)
Admission: RE | Admit: 2013-10-04 | Discharge: 2013-10-04 | Disposition: A | Discharge status: Home / Self Care | Payer: Managed Care, Other (non HMO) | Source: Ambulatory Visit | Attending: Internal Medicine | Admitting: Internal Medicine

## 2013-10-04 DIAGNOSIS — R0989 Other specified symptoms and signs involving the circulatory and respiratory systems: Secondary | ICD-10-CM

## 2013-10-04 DIAGNOSIS — I10 Essential (primary) hypertension: Secondary | ICD-10-CM | POA: Insufficient documentation

## 2013-10-04 DIAGNOSIS — F17200 Nicotine dependence, unspecified, uncomplicated: Secondary | ICD-10-CM | POA: Insufficient documentation

## 2013-10-06 ENCOUNTER — Other Ambulatory Visit (INDEPENDENT_AMBULATORY_CARE_PROVIDER_SITE_OTHER): Payer: Managed Care, Other (non HMO) | Admitting: *Deleted

## 2013-10-06 DIAGNOSIS — Z1212 Encounter for screening for malignant neoplasm of rectum: Secondary | ICD-10-CM

## 2013-10-06 LAB — POC HEMOCCULT BLD/STL (HOME/3-CARD/SCREEN)
FECAL OCCULT BLD: NEGATIVE
FECAL OCCULT BLD: NEGATIVE
FECAL OCCULT BLD: NEGATIVE

## 2013-11-06 ENCOUNTER — Other Ambulatory Visit: Payer: Self-pay | Admitting: Internal Medicine

## 2013-11-06 ENCOUNTER — Other Ambulatory Visit: Payer: Self-pay | Admitting: Emergency Medicine

## 2013-11-06 MED ORDER — TESTOSTERONE CYPIONATE 200 MG/ML IM SOLN: INTRAMUSCULAR | Status: DC

## 2013-12-25 ENCOUNTER — Ambulatory Visit (INDEPENDENT_AMBULATORY_CARE_PROVIDER_SITE_OTHER): Payer: Managed Care, Other (non HMO) | Admitting: Physician Assistant

## 2013-12-25 ENCOUNTER — Encounter: Payer: Self-pay | Admitting: Physician Assistant

## 2013-12-25 VITALS — BP 122/72 | HR 80 | Temp 98.4°F | Resp 16 | Wt 163.0 lb

## 2013-12-25 DIAGNOSIS — R0989 Other specified symptoms and signs involving the circulatory and respiratory systems: Secondary | ICD-10-CM

## 2013-12-25 DIAGNOSIS — Z79899 Other long term (current) drug therapy: Secondary | ICD-10-CM

## 2013-12-25 DIAGNOSIS — R7309 Other abnormal glucose: Secondary | ICD-10-CM

## 2013-12-25 DIAGNOSIS — E559 Vitamin D deficiency, unspecified: Secondary | ICD-10-CM

## 2013-12-25 DIAGNOSIS — I10 Essential (primary) hypertension: Secondary | ICD-10-CM

## 2013-12-25 DIAGNOSIS — E291 Testicular hypofunction: Secondary | ICD-10-CM

## 2013-12-25 DIAGNOSIS — E782 Mixed hyperlipidemia: Secondary | ICD-10-CM

## 2013-12-25 DIAGNOSIS — E785 Hyperlipidemia, unspecified: Secondary | ICD-10-CM

## 2013-12-25 DIAGNOSIS — R7303 Prediabetes: Secondary | ICD-10-CM

## 2013-12-25 LAB — CBC WITH DIFFERENTIAL/PLATELET
BASOS PCT: 1 % (ref 0–1)
Basophils Absolute: 0.1 10*3/uL (ref 0.0–0.1)
Eosinophils Absolute: 0.3 10*3/uL (ref 0.0–0.7)
Eosinophils Relative: 3 % (ref 0–5)
HEMATOCRIT: 46.8 % (ref 39.0–52.0)
Hemoglobin: 15.8 g/dL (ref 13.0–17.0)
Lymphocytes Relative: 22 % (ref 12–46)
Lymphs Abs: 1.9 10*3/uL (ref 0.7–4.0)
MCH: 33 pg (ref 26.0–34.0)
MCHC: 33.8 g/dL (ref 30.0–36.0)
MCV: 97.7 fL (ref 78.0–100.0)
MONO ABS: 0.8 10*3/uL (ref 0.1–1.0)
Monocytes Relative: 9 % (ref 3–12)
NEUTROS ABS: 5.7 10*3/uL (ref 1.7–7.7)
Neutrophils Relative %: 65 % (ref 43–77)
PLATELETS: 205 10*3/uL (ref 150–400)
RBC: 4.79 MIL/uL (ref 4.22–5.81)
RDW: 13.6 % (ref 11.5–15.5)
WBC: 8.8 10*3/uL (ref 4.0–10.5)

## 2013-12-25 MED ORDER — ERYTHROMYCIN 2 % EX GEL: Freq: Two times a day (BID) | CUTANEOUS | Status: DC

## 2013-12-25 NOTE — Progress Notes (Signed)
HPI 58 y.o. male  presents for 3 month follow up with hypertension, hyperlipidemia, prediabetes and vitamin D. His blood pressure has been controlled at home, today their BP is BP: 122/72 mmHg He does workout, very active outside with yard work, walks.  He denies chest pain, shortness of breath, dizziness.  He is not on cholesterol medication, stopped due to better cholesterol and states that it caused his "bones to ache". His cholesterol is at goal. The cholesterol last visit was:   Lab Results  Component Value Date   CHOL 183 09/21/2013   HDL 90 09/21/2013   LDLCALC 46 09/21/2013   TRIG 233* 09/21/2013   CHOLHDL 2.0 09/21/2013    Last A1C in the office was:  Lab Results  Component Value Date   HGBA1C 5.6 09/21/2013   Patient is on Vitamin D supplement.   He is has a history of testosterone deficiency and is on testosterone replacement, was 10 days ago and does it every 2 weeks. He states that the testosterone helps with his energy, libido, muscle mass.  Lab Results  Component Value Date   TESTOSTERONE 204* 09/21/2013  Continues to smoke but he is interested in quitting, he tried chantix years ago and states it gave him bad dreams.   Current Medications:  Current Outpatient Prescriptions on File Prior to Visit  Medication Sig Dispense Refill  . aspirin 325 MG tablet Take 325 mg by mouth daily.      . Cholecalciferol (VITAMIN D-3) 5000 UNITS TABS Take 5,000 Units by mouth daily.       Marland Kitchen testosterone cypionate (DEPOTESTOTERONE CYPIONATE) 200 MG/ML injection INJECT 2 ML IN THE MUSCLE EVERY 2 WEEKS  10 mL  0  . valACYclovir (VALTREX) 500 MG tablet TAKE HALF TABLET BY MOUTH TWICE DAILY AS NEEDED FOR FEVER BLISTER.  30 tablet  6   No current facility-administered medications on file prior to visit.   Medical History:  Past Medical History  Diagnosis Date  . Hypertension   . Hyperlipidemia   . Pre-diabetes   . Hypogonadism male   . Vitamin D deficiency    Allergies:  Allergies   Allergen Reactions  . Bee Venom Anaphylaxis  . 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 Voltaren [Diclofenac Sodium] Rash     Review of Systems: [X]  = complains of  [ ]  = denies  General: Fatigue [ ]  Fever [ ]  Chills [ ]  Weakness [ ]   Insomnia [ ]  Eyes: Redness [ ]  Blurred vision [ ]  Diplopia [ ]   ENT: Congestion [ ]  Sinus Pain [ ]  Post Nasal Drip [ ]  Sore Throat [ ]  Earache [ ]   Cardiac: Chest pain/pressure [ ]  SOB [ ]  Orthopnea [ ]   Palpitations [ ]   Paroxysmal nocturnal dyspnea[ ]  Claudication [ ]  Edema [ ]   Pulmonary: Cough [ ]  Wheezing[ ]   SOB [ ]   Snoring [ ]   GI: Nausea [ ]  Vomiting[ ]  Dysphagia[ ]  Heartburn[ ]  Abdominal pain [ ]  Constipation [ ] ; Diarrhea [ ] ; BRBPR [ ]  Melena[ ]  GU: Hematuria[ ]  Dysuria [ ]  Nocturia[ ]  Urgency [ ]   Hesitancy [ ]  Discharge [ ]  Neuro: Headaches[ ]  Vertigo[ ]  Paresthesias[ ]  Spasm [ ]  Speech changes [ ]  Incoordination [ ]   Ortho: Arthritis [ ]  Joint pain [ ]  Muscle pain [ ]  Joint swelling [ ]  Back Pain [ ]  Skin:  Rash [ ]   Pruritis [ ]  Change in skin lesion [ ]   Psych: Depression[ ]  Anxiety[ ]   Confusion [ ]  Memory loss [ ]   Heme/Lypmh: Bleeding [ ]  Bruising [ ]  Enlarged lymph nodes [ ]   Endocrine: Visual blurring [ ]  Paresthesia [ ]  Polyuria [ ]  Polydypsea [ ]    Heat/cold intolerance [ ]  Hypoglycemia [ ]   Family history- Review and unchanged Social history- Review and unchanged Physical Exam: BP 122/72  Pulse 80  Temp(Src) 98.4 F (36.9 C)  Resp 16  Wt 163 lb (73.936 kg) Wt Readings from Last 3 Encounters:  12/25/13 163 lb (73.936 kg)  09/21/13 164 lb 3.2 oz (74.481 kg)  07/24/13 168 lb (76.204 kg)   General Appearance: Well nourished, in no apparent distress. Eyes: PERRLA, EOMs, conjunctiva no swelling or erythema Sinuses: No Frontal/maxillary tenderness ENT/Mouth: Ext aud canals clear, TMs without erythema, bulging. No erythema, swelling, or exudate on post pharynx.  Tonsils not swollen or erythematous. Hearing normal.   Neck: Supple, thyroid normal.  Respiratory: Respiratory effort normal, BS equal bilaterally without rales, rhonchi, wheezing or stridor.  Cardio: RRR with no MRGs. Brisk peripheral pulses without edema.  Abdomen: Soft, + BS.  Non tender, no guarding, rebound, hernias, masses. Lymphatics: Non tender without lymphadenopathy.  Musculoskeletal: Full ROM, 5/5 strength, normal gait.  Skin: Warm, dry without rashes, lesions, ecchymosis. Erythematous with vascularized with bulbus nose.   Neuro: Cranial nerves intact. Normal muscle tone, no cerebellar symptoms. Sensation intact.  Psych: Awake and oriented X 3, normal affect, Insight and Judgment appropriate.   Assessment and Plan:  Hypertension: Continue medication, monitor blood pressure at home. Continue DASH diet. Cholesterol: Continue diet and exercise. Check cholesterol.  Pre-diabetes-Continue diet and exercise. Check A1C Vitamin D Def- check level and continue medications.  Hypogonadism- continue replacement therapy, check testosterone levels as needed.  Smoking cessation- patches given and info on changtix.  Rosacea- eryrtho gel sent in and decrease drinking Several skin tags and placed on arm/one on right AB that need to be frozen/removed- will schedule    Continue diet and meds as discussed. Further disposition pending results of labs.  Vicie Mutters 4:00 PM

## 2013-12-25 NOTE — Patient Instructions (Signed)
We are giving you chantix for smoking cessation. You can do it! And we are here to help! You may have heard some scary side effects about chantix, the three most common I hear about are nausea, crazy dreams and depression.  However, I like for my patients to try to stay on 1/2 a tablet twice a day rather than one tablet twice a day as normally prescribed. This helps decrease the chances of side effects and helps save money by making a one month prescription last two months  Please start the prescription this way:  Start 1/2 tablet by mouth once daily after food with a full glass of water for 3 days Then do 1/2 tablet by mouth twice daily for 4 days.  At this point we have several options: 1) continue on 1/2 tablet twice a day- which I encourage you to do. You can stay on this dose the rest of the time on the medication or if you still feel the need to smoke you can do one of the two options below. 2) do one tablet in the morning and 1/2 in the evening which helps decrease dreams. 3) do one tablet twice a day.   What if I miss a dose? If you miss a dose, take it as soon as you can. If it is almost time for your next dose, take only that dose. Do not take double or extra doses.  What should I watch for while using this medicine? Visit your doctor or health care professional for regular check ups. Ask for ongoing advice and encouragement from your doctor or healthcare professional, friends, and family to help you quit. If you smoke while on this medication, quit again  Your mouth may get dry. Chewing sugarless gum or hard candy, and drinking plenty of water may help. Contact your doctor if the problem does not go away or is severe.  You may get drowsy or dizzy. Do not drive, use machinery, or do anything that needs mental alertness until you know how this medicine affects you. Do not stand or sit up quickly, especially if you are an older patient.   The use of this medicine may increase the chance  of suicidal thoughts or actions. Pay special attention to how you are responding while on this medicine. Any worsening of mood, or thoughts of suicide or dying should be reported to your health care professional right away.  ADVANTAGES OF QUITTING SMOKING  Within 20 minutes, blood pressure decreases. Your pulse is at normal level.  After 8 hours, carbon monoxide levels in the blood return to normal. Your oxygen level increases.  After 24 hours, the chance of having a heart attack starts to decrease. Your breath, hair, and body stop smelling like smoke.  After 48 hours, damaged nerve endings begin to recover. Your sense of taste and smell improve.  After 72 hours, the body is virtually free of nicotine. Your bronchial tubes relax and breathing becomes easier.  After 2 to 12 weeks, lungs can hold more air. Exercise becomes easier and circulation improves.  After 1 year, the risk of coronary heart disease is cut in half.  After 5 years, the risk of stroke falls to the same as a nonsmoker.  After 10 years, the risk of lung cancer is cut in half and the risk of other cancers decreases significantly.  After 15 years, the risk of coronary heart disease drops, usually to the level of a nonsmoker.  You will have extra money   to spend on things other than cigarettes.  Nicotine skin patches What is this medicine? NICOTINE ([place]) helps people stop smoking. The patches replace the nicotine found in cigarettes and help to decrease withdrawal effects. They are most effective when used in combination with a stop-smoking program. This medicine may be used for other purposes; ask your health care provider or pharmacist if you have questions. COMMON BRAND NAME(S): Habitrol, Nicoderm CQ, Nicotrol What should I tell my health care provider before I take this medicine? They need to know if you have any of these conditions: -diabetes -heart disease, angina, irregular heartbeat or previous heart  attack -lung disease, including asthma -overactive thyroid -pheochromocytoma -skin problems -stomach problems or ulcers -an unusual or allergic reaction to nicotine, adhesives, other medicines, foods, dyes, or preservatives -pregnant or trying to get pregnant -breast-feeding How should I use this medicine? This medicine is for use on the skin. Follow the directions that come with the patches. Find an area of skin on your upper arm, chest, or back that is clean, dry, greaseless, undamaged and hairless. Wash hands with plain soap and water. Do not use anything that contains aloe, lanolin or glycerin as these may prevent the patch from sticking. Dry thoroughly. Remove the patch from the sealed pouch. Do not try to cut or trim the patch. Using your palm, press the patch firmly in place for 10 seconds to make sure that there is good contact with your skin. After applying the patch, wash your hands. Change the patch every day, keeping to a regular schedule. When you apply a new patch, use a new area of skin. Wait at least 1 week before using the same area again. Talk to your pediatrician regarding the use of this medicine in children. Special care may be needed. Overdosage: If you think you have taken too much of this medicine contact a poison control center or emergency room at once. NOTE: This medicine is only for you. Do not share this medicine with others. What if I miss a dose? If you forget to replace a patch, use it as soon as you can. Only use one patch at a time and do not leave on the skin for longer than directed. If a patch falls off, you can replace it, but keep to your schedule and remove the patch at the right time. What may interact with this medicine? -medicines for asthma -medicines for blood pressure -medicines for mental depression This list may not describe all possible interactions. Give your health care provider a list of all the medicines, herbs, non-prescription drugs, or  dietary supplements you use. Also tell them if you smoke, drink alcohol, or use illegal drugs. Some items may interact with your medicine. What should I watch for while using this medicine? Do not smoke, chew nicotine gum, or use snuff while you are using this medicine. This reduces the chance of a nicotine overdose. You can keep the patch in place during swimming, bathing, and showering. If your patch falls off during these activities, replace it. When you first apply the patch, your skin may itch or burn. This should soon go away. When you remove a patch, the skin may look red, but this should only last for a day. Call your doctor or health care professional if you get a permanent skin rash. If you are a diabetic and you quit smoking, the effects of insulin may be increased and you may need to reduce your insulin dose. Check with your  doctor or health care professional about how you should adjust your insulin dose. If you are going to have a magnetic resonance imaging (MRI) procedure, tell your MRI technician if you have this patch on your body. It must be removed before a MRI. What side effects may I notice from receiving this medicine? Side effects that you should report to your doctor or health care professional as soon as possible: -allergic reactions like skin rash, itching or hives, swelling of the face, lips, or tongue -breathing problems -changes in hearing -changes in vision -chest pain -cold sweats -confusion -fast, irregular heartbeat -feeling faint or lightheaded, falls -headache -increased saliva -nausea, vomiting -skin redness that lasts more than 4 days -stomach pain -weakness Side effects that usually do not require medical attention (report to your doctor or health care professional if they continue or are bothersome): -diarrhea -dry mouth -hiccups -irritability -nervousness or restlessness -trouble sleeping or vivid dreams This list may not describe all possible  side effects. Call your doctor for medical advice about side effects. You may report side effects to FDA at 1-800-FDA-1088. Where should I keep my medicine? Keep out of the reach of children. Store at room temperature between 20 and 25 degrees C (68 and 77 degrees F). Protect from heat and light. Store in International aid/development worker until ready to use. Throw away unused medicine after the expiration date. When you remove a patch, fold with sticky sides together; put in an empty opened pouch and throw away. NOTE: This sheet is a summary. It may not cover all possible information. If you have questions about this medicine, talk to your doctor, pharmacist, or health care provider.  2014, Elsevier/Gold Standard. (2010-09-30 13:06:00)  Rosacea Rosacea is a long-term (chronic) condition that affects the skin of the face (cheeks, nose, brow, and chin) and sometimes the eyes. Rosacea causes the blood vessels near the surface of the skin to enlarge, resulting in redness. This condition usually begins after age 81. It occurs most often in light-skinned women. Without treatment, rosacea tends to get worse over time. There is no cure for rosacea, but treatment can help control your symptoms. CAUSES  The cause is unknown. It is thought that some people may inherit a tendency to develop rosacea. Certain triggers can make your rosacea worse, including:  Hot baths.  Exercise.  Sunlight.  Very hot or cold temperatures.  Hot or spicy foods and drinks.  Drinking alcohol.  Stress.  Taking blood pressure medicine.  Long-term use of topical steroids on the face. SYMPTOMS   Redness of the face.  Red bumps or pimples on the face.  Red, enlarged nose (rhinophyma).  Blushing easily.  Red lines on the skin.  Irritated or burning feeling in the eyes.  Swollen eyelids. DIAGNOSIS  Your caregiver can usually tell what is wrong by asking about your symptoms and performing a physical exam. TREATMENT    Avoiding triggers is an important part of treatment. You will also need to see a skin specialist (dermatologist) who can develop a treatment plan for you. The goals of treatment are to control your condition and to improve the appearance of your skin. It may take several weeks or months of treatment before you notice an improvement in your skin. Even after your skin improves, you will likely need to continue treatment to prevent your rosacea from coming back. Treatment methods may include:  Using sunscreen or sunblock daily to protect the skin.  Antibiotic medicine, such as metronidazole, applied directly to the skin.  Antibiotics taken by mouth. This is usually prescribed if you have eye problems from your rosacea.  Laser surgery to improve the appearance of the skin. This surgery can reduce the appearance of red lines on the skin and can remove excess tissue from the nose to reduce its size. HOME CARE INSTRUCTIONS  Avoid things that seem to trigger your flare-ups.  If you are given antibiotics, take them as directed. Finish them even if you start to feel better.  Use a gentle facial cleanser that does not contain alcohol.  You may use a mild facial moisturizer.  Use a sunscreen or sunblock with SPF 30 or greater.  Wear a green-tinted foundation powder to conceal redness, if needed. Choose cosmetics that are noncomedogenic. This means they do not block your pores.  If your eyelids are affected, apply warm compresses to the eyelids. Do this up to 4 times a day or as directed by your caregiver. SEEK MEDICAL CARE IF:  Your skin problems get worse.  You feel depressed.  You lose your appetite.  You have trouble concentrating.  You have problems with your eyes, such as redness or itching. MAKE SURE YOU:  Understand these instructions.  Will watch your condition.  Will get help right away if you are not doing well or get worse. Document Released: 09/03/2004 Document Revised:  01/26/2012 Document Reviewed: 07/07/2011 [facility] Patient Information 2014 [place], [place].

## 2013-12-26 LAB — TSH: TSH: 1.659 u[IU]/mL (ref 0.350–4.500)

## 2013-12-26 LAB — BASIC METABOLIC PANEL WITH GFR
BUN: 8 mg/dL (ref 6–23)
CO2: 31 mEq/L (ref 19–32)
Calcium: 9.4 mg/dL (ref 8.4–10.5)
Chloride: 99 mEq/L (ref 96–112)
Creat: 0.97 mg/dL (ref 0.50–1.35)
GFR, Est African American: >89 mL/min
GFR, Est Non African American: 84 mL/min
Glucose, Bld: 77 mg/dL (ref 70–99)
Potassium: 4.2 mEq/L (ref 3.5–5.3)
Sodium: 139 mEq/L (ref 135–145)

## 2013-12-26 LAB — LIPID PANEL
Cholesterol: 159 mg/dL (ref 0–200)
HDL: 80 mg/dL (ref >39)
LDL Cholesterol: 46 mg/dL (ref 0–99)
Total CHOL/HDL Ratio: 2 Ratio
Triglycerides: 163 mg/dL — ABNORMAL HIGH (ref <150)
VLDL: 33 mg/dL (ref 0–40)

## 2013-12-26 LAB — HEPATIC FUNCTION PANEL
ALBUMIN: 3.9 g/dL (ref 3.5–5.2)
ALT: 40 U/L (ref 0–53)
AST: 88 U/L — ABNORMAL HIGH (ref 0–37)
Alkaline Phosphatase: 62 U/L (ref 39–117)
Bilirubin, Direct: 0.1 mg/dL (ref 0.0–0.3)
Indirect Bilirubin: 0.3 mg/dL (ref 0.2–1.2)
TOTAL PROTEIN: 6.1 g/dL (ref 6.0–8.3)
Total Bilirubin: 0.4 mg/dL (ref 0.2–1.2)

## 2013-12-26 LAB — MAGNESIUM: Magnesium: 1.9 mg/dL (ref 1.5–2.5)

## 2013-12-26 LAB — VITAMIN D 25 HYDROXY (VIT D DEFICIENCY, FRACTURES): Vit D, 25-Hydroxy: 80 ng/mL (ref 30–89)

## 2013-12-26 LAB — HEMOGLOBIN A1C
Hgb A1c MFr Bld: 5.8 % — ABNORMAL HIGH (ref <5.7)
Mean Plasma Glucose: 120 mg/dL — ABNORMAL HIGH (ref <117)

## 2014-04-02 ENCOUNTER — Encounter: Payer: Self-pay | Admitting: Internal Medicine

## 2014-04-02 ENCOUNTER — Ambulatory Visit (INDEPENDENT_AMBULATORY_CARE_PROVIDER_SITE_OTHER): Payer: Managed Care, Other (non HMO) | Admitting: Internal Medicine

## 2014-04-02 VITALS — BP 120/80 | HR 68 | Temp 98.8°F | Resp 16 | Ht 67.25 in | Wt 160.0 lb

## 2014-04-02 DIAGNOSIS — N200 Calculus of kidney: Secondary | ICD-10-CM

## 2014-04-02 DIAGNOSIS — E782 Mixed hyperlipidemia: Secondary | ICD-10-CM

## 2014-04-02 DIAGNOSIS — E559 Vitamin D deficiency, unspecified: Secondary | ICD-10-CM

## 2014-04-02 DIAGNOSIS — R7309 Other abnormal glucose: Secondary | ICD-10-CM

## 2014-04-02 DIAGNOSIS — R0989 Other specified symptoms and signs involving the circulatory and respiratory systems: Secondary | ICD-10-CM

## 2014-04-02 DIAGNOSIS — I10 Essential (primary) hypertension: Secondary | ICD-10-CM

## 2014-04-02 DIAGNOSIS — Z79899 Other long term (current) drug therapy: Secondary | ICD-10-CM

## 2014-04-02 DIAGNOSIS — E291 Testicular hypofunction: Secondary | ICD-10-CM

## 2014-04-02 LAB — CBC WITH DIFFERENTIAL/PLATELET
Basophils Absolute: 0.1 10*3/uL (ref 0.0–0.1)
Basophils Relative: 1 % (ref 0–1)
Eosinophils Absolute: 0.4 10*3/uL (ref 0.0–0.7)
Eosinophils Relative: 5 % (ref 0–5)
HCT: 45.3 % (ref 39.0–52.0)
HEMOGLOBIN: 15.6 g/dL (ref 13.0–17.0)
Lymphocytes Relative: 28 % (ref 12–46)
Lymphs Abs: 2.4 10*3/uL (ref 0.7–4.0)
MCH: 33.6 pg (ref 26.0–34.0)
MCHC: 34.4 g/dL (ref 30.0–36.0)
MCV: 97.6 fL (ref 78.0–100.0)
MONOS PCT: 12 % (ref 3–12)
Monocytes Absolute: 1 10*3/uL (ref 0.1–1.0)
NEUTROS ABS: 4.7 10*3/uL (ref 1.7–7.7)
Neutrophils Relative %: 54 % (ref 43–77)
Platelets: 244 10*3/uL (ref 150–400)
RBC: 4.64 MIL/uL (ref 4.22–5.81)
RDW: 14 % (ref 11.5–15.5)
WBC: 8.7 10*3/uL (ref 4.0–10.5)

## 2014-04-02 NOTE — Patient Instructions (Addendum)

## 2014-04-02 NOTE — Progress Notes (Signed)
Patient ID: Ruben Mccormick, male   DOB: 1956-01-31, 58 y.o.   MRN: [id number]   This very nice 58 y.o.MWM presents for 3 month follow up with Hypertension, Hyperlipidemia, Pre-Diabetes and Vitamin D Deficiency.    Patient is monitored expectantly for HTN since 2005 & BP has been controlled with today's BP: 120/80 mmHg. Patient denies any cardiac type chest pain, palpitations, dyspnea/orthopnea/PND, dizziness, claudication, or dependent edema.  Hyperlipidemia is controlled with diet & meds. Patient denies myalgias or other med SE's. Last Lipids were at goal with Total Chol 159; HDL Chol 80; LDL  46; Trig 163* on  12/25/2013.   Also, the patient has history of PreDiabetes (2011) and patient denies any symptoms of reactive hypoglycemia, diabetic polys, paresthesias or visual blurring.  Last A1c was  5.8% on  12/25/2013.   Further, Patient has history of Vitamin D Deficiency of 27 in 2009 and patient supplements vitamin D without any suspected side-effects. Last vitamin D was 80 on 12/25/2013.   Medication List   aspirin 325 MG tablet  Take 325 mg by mouth daily.     erythromycin with ethanol 2 % gel  Commonly known as:  EMGEL  Apply topically 2 (two) times daily.     testosterone cypionate 200 MG/ML injection  INJECT 2 ML IM EVERY 2 WEEKS     valACYclovir 500 MG tablet  TAKE HALF TABLET BY MOUTH TWICE DAILY AS NEEDED      Vitamin D-3 5000 UNITS Tabs  Take 5,000 Units by mouth daily.     Allergies  Allergen Reactions  . Bee Venom Anaphylaxis  . 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 Voltaren [Diclofenac Sodium] Rash   PMHx:   Past Medical History  Diagnosis Date  . Hypertension   . Hyperlipidemia   . Pre-diabetes   . Hypogonadism male   . Vitamin D deficiency    FHx:    Reviewed / unchanged SHx:    Reviewed / unchanged  Systems Review:  Constitutional: Denies fever, chills, wt changes, headaches, insomnia, fatigue, night sweats, change in appetite. Eyes: Denies redness,  blurred vision, diplopia, discharge, itchy, watery eyes.  ENT: Denies discharge, congestion, post nasal drip, epistaxis, sore throat, earache, hearing loss, dental pain, tinnitus, vertigo, sinus pain, snoring.  CV: Denies chest pain, palpitations, irregular heartbeat, syncope, dyspnea, diaphoresis, orthopnea, PND, claudication or edema. Respiratory: denies cough, dyspnea, DOE, pleurisy, hoarseness, laryngitis, wheezing.  Gastrointestinal: Denies dysphagia, odynophagia, heartburn, reflux, water brash, abdominal pain or cramps, nausea, vomiting, bloating, diarrhea, constipation, hematemesis, melena, hematochezia  or hemorrhoids. Genitourinary: Denies dysuria, frequency, urgency, nocturia, hesitancy, discharge, hematuria or flank pain. Musculoskeletal: Denies arthralgias, myalgias, stiffness, jt. swelling, pain, limping or strain/sprain.  Skin: Denies pruritus, rash, hives, warts, acne, eczema or change in skin lesion(s). Neuro: No weakness, tremor, incoordination, spasms, paresthesia or pain. Psychiatric: Denies confusion, memory loss or sensory loss. Endo: Denies change in weight, skin or hair change.  Heme/Lymph: No excessive bleeding, bruising or enlarged lymph nodes.  Exam:  BP 120/80  Pulse 68  Temp(Src) 98.8 F (37.1 C) (Temporal)  Resp 16  Ht 5' 7.25" (1.708 m)  Wt 160 lb (72.576 kg)  BMI 24.88 kg/m2  Appears well nourished and in no distress. Eyes: PERRLA, EOMs, conjunctiva no swelling or erythema. Sinuses: No frontal/maxillary tenderness ENT/Mouth: EAC's clear, TM's nl w/o erythema, bulging. Nares clear w/o erythema, swelling, exudates. Oropharynx clear without erythema or exudates. Oral hygiene is good. Tongue normal, non obstructing. Hearing intact.  Neck: Supple. Thyroid nl. Musician  2+/2+ without bruits, nodes or JVD. Chest: Respirations nl with BS clear & equal w/o rales, rhonchi, wheezing or stridor.  Cor: Heart sounds normal w/ regular rate and rhythm without sig. murmurs,  gallops, clicks, or rubs. Peripheral pulses normal and equal  without edema.  Abdomen: Soft & bowel sounds normal. Non-tender w/o guarding, rebound, hernias, masses, or organomegaly.  Lymphatics: Unremarkable.  Musculoskeletal: Full ROM all peripheral extremities, joint stability, 5/5 strength, and normal gait.  Skin: Warm, dry without exposed rashes, lesions or ecchymosis apparent.  Neuro: Cranial nerves intact, reflexes equal bilaterally. Sensory-motor testing grossly intact. Tendon reflexes grossly intact.  Pysch: Alert & oriented x 3.  Insight and judgement nl & appropriate. No ideations.   Assessment and Plan:  1. Hypertension, Hx - Continue monitor blood pressure at home. Continue diet/exercise same.  2. Hyperlipidemia - Continue diet, exercise,& lifestyle modifications. Continue monitor periodic cholesterol/liver & renal functions   3. Pre-Diabetes - Continue diet, exercise, lifestyle modifications. Monitor appropriate labs.  4. Vitamin D Deficiency - Continue supplementation.  5.Testosterone Deficiency - Continue Supplementation  Recommended regular exercise, BP monitoring, weight control, and discussed med and SE's. Recommended labs to assess and monitor clinical status. Further disposition pending results of labs.

## 2014-04-03 LAB — HEMOGLOBIN A1C
Hgb A1c MFr Bld: 5.7 % — ABNORMAL HIGH (ref <5.7)
MEAN PLASMA GLUCOSE: 117 mg/dL — AB (ref <117)

## 2014-04-03 LAB — INSULIN, FASTING: Insulin fasting, serum: <1 u[IU]/mL — ABNORMAL LOW (ref 2.0–19.6)

## 2014-04-03 LAB — MAGNESIUM: MAGNESIUM: 1.8 mg/dL (ref 1.5–2.5)

## 2014-04-03 LAB — BASIC METABOLIC PANEL WITH GFR
BUN: 10 mg/dL (ref 6–23)
CO2: 30 mEq/L (ref 19–32)
Calcium: 8.9 mg/dL (ref 8.4–10.5)
Chloride: 103 mEq/L (ref 96–112)
Creat: 0.97 mg/dL (ref 0.50–1.35)
GFR, Est Non African American: 84 mL/min
GLUCOSE: 80 mg/dL (ref 70–99)
POTASSIUM: 4.3 meq/L (ref 3.5–5.3)
SODIUM: 141 meq/L (ref 135–145)

## 2014-04-03 LAB — LIPID PANEL
CHOLESTEROL: 169 mg/dL (ref 0–200)
HDL: 86 mg/dL (ref >39)
LDL Cholesterol: 56 mg/dL (ref 0–99)
TRIGLYCERIDES: 135 mg/dL (ref <150)
Total CHOL/HDL Ratio: 2 Ratio
VLDL: 27 mg/dL (ref 0–40)

## 2014-04-03 LAB — TSH: TSH: 2.023 u[IU]/mL (ref 0.350–4.500)

## 2014-04-03 LAB — VITAMIN D 25 HYDROXY (VIT D DEFICIENCY, FRACTURES): VIT D 25 HYDROXY: 88 ng/mL (ref 30–89)

## 2014-04-03 LAB — TESTOSTERONE: TESTOSTERONE: 1214 ng/dL — AB (ref 300–890)

## 2014-06-03 ENCOUNTER — Other Ambulatory Visit: Payer: Self-pay | Admitting: Emergency Medicine

## 2014-07-16 ENCOUNTER — Other Ambulatory Visit: Payer: Self-pay | Admitting: *Deleted

## 2014-07-16 MED ORDER — TRIAMCINOLONE 0.1 % CREAM:EUCERIN CREAM 1:1: 1.0000 "application " | TOPICAL_CREAM | Freq: Two times a day (BID) | CUTANEOUS | Status: DC

## 2014-07-16 MED ORDER — TRIAMCINOLONE 0.1 % CREAM:EUCERIN CREAM 1:1: 1.0000 "application " | TOPICAL_CREAM | Freq: Two times a day (BID) | CUTANEOUS | Status: AC

## 2014-09-24 ENCOUNTER — Encounter: Payer: Self-pay | Admitting: Internal Medicine

## 2014-10-05 ENCOUNTER — Other Ambulatory Visit: Payer: Self-pay | Admitting: Physician Assistant

## 2014-11-12 ENCOUNTER — Ambulatory Visit (INDEPENDENT_AMBULATORY_CARE_PROVIDER_SITE_OTHER): Payer: Commercial Managed Care - PPO | Admitting: Internal Medicine

## 2014-11-12 ENCOUNTER — Encounter: Payer: Self-pay | Admitting: Internal Medicine

## 2014-11-12 VITALS — BP 160/90 | HR 84 | Temp 98.1°F | Resp 16 | Ht 67.5 in | Wt 156.0 lb

## 2014-11-12 DIAGNOSIS — Z79899 Other long term (current) drug therapy: Secondary | ICD-10-CM

## 2014-11-12 DIAGNOSIS — E559 Vitamin D deficiency, unspecified: Secondary | ICD-10-CM

## 2014-11-12 DIAGNOSIS — Z125 Encounter for screening for malignant neoplasm of prostate: Secondary | ICD-10-CM

## 2014-11-12 DIAGNOSIS — E291 Testicular hypofunction: Secondary | ICD-10-CM

## 2014-11-12 DIAGNOSIS — R7309 Other abnormal glucose: Secondary | ICD-10-CM

## 2014-11-12 DIAGNOSIS — E782 Mixed hyperlipidemia: Secondary | ICD-10-CM

## 2014-11-12 DIAGNOSIS — R5383 Other fatigue: Secondary | ICD-10-CM

## 2014-11-12 DIAGNOSIS — I10 Essential (primary) hypertension: Secondary | ICD-10-CM

## 2014-11-12 DIAGNOSIS — Z1212 Encounter for screening for malignant neoplasm of rectum: Secondary | ICD-10-CM

## 2014-11-12 DIAGNOSIS — R0989 Other specified symptoms and signs involving the circulatory and respiratory systems: Secondary | ICD-10-CM

## 2014-11-12 DIAGNOSIS — E349 Endocrine disorder, unspecified: Secondary | ICD-10-CM

## 2014-11-12 DIAGNOSIS — R7303 Prediabetes: Secondary | ICD-10-CM

## 2014-11-12 LAB — LIPID PANEL
CHOLESTEROL: 150 mg/dL (ref 0–200)
HDL: 75 mg/dL (ref >=40)
LDL Cholesterol: 52 mg/dL (ref 0–99)
TRIGLYCERIDES: 117 mg/dL (ref <150)
Total CHOL/HDL Ratio: 2 Ratio
VLDL: 23 mg/dL (ref 0–40)

## 2014-11-12 LAB — BASIC METABOLIC PANEL WITH GFR
BUN: 13 mg/dL (ref 6–23)
CO2: 28 meq/L (ref 19–32)
Calcium: 8.4 mg/dL (ref 8.4–10.5)
Chloride: 102 mEq/L (ref 96–112)
Creat: 0.78 mg/dL (ref 0.50–1.35)
Glucose, Bld: 171 mg/dL — ABNORMAL HIGH (ref 70–99)
Potassium: 4.1 mEq/L (ref 3.5–5.3)
SODIUM: 140 meq/L (ref 135–145)

## 2014-11-12 LAB — CBC WITH DIFFERENTIAL/PLATELET
Basophils Absolute: 0.1 10*3/uL (ref 0.0–0.1)
Basophils Relative: 1 % (ref 0–1)
EOS PCT: 6 % — AB (ref 0–5)
Eosinophils Absolute: 0.5 10*3/uL (ref 0.0–0.7)
HCT: 44 % (ref 39.0–52.0)
Hemoglobin: 14.6 g/dL (ref 13.0–17.0)
Lymphocytes Relative: 27 % (ref 12–46)
Lymphs Abs: 2.3 10*3/uL (ref 0.7–4.0)
MCH: 31.7 pg (ref 26.0–34.0)
MCHC: 33.2 g/dL (ref 30.0–36.0)
MCV: 95.7 fL (ref 78.0–100.0)
MPV: 11.4 fL (ref 8.6–12.4)
Monocytes Absolute: 0.7 10*3/uL (ref 0.1–1.0)
Monocytes Relative: 8 % (ref 3–12)
NEUTROS ABS: 5 10*3/uL (ref 1.7–7.7)
Neutrophils Relative %: 58 % (ref 43–77)
Platelets: 189 10*3/uL (ref 150–400)
RBC: 4.6 MIL/uL (ref 4.22–5.81)
RDW: 14.1 % (ref 11.5–15.5)
WBC: 8.7 10*3/uL (ref 4.0–10.5)

## 2014-11-12 LAB — HEPATIC FUNCTION PANEL
ALK PHOS: 62 U/L (ref 39–117)
ALT: 21 U/L (ref 0–53)
AST: 26 U/L (ref 0–37)
Albumin: 3.7 g/dL (ref 3.5–5.2)
BILIRUBIN TOTAL: 0.4 mg/dL (ref 0.2–1.2)
Bilirubin, Direct: <0.1 mg/dL (ref 0.0–0.3)
Total Protein: 5.6 g/dL — ABNORMAL LOW (ref 6.0–8.3)

## 2014-11-12 LAB — IRON AND TIBC
%SAT: 10 % — ABNORMAL LOW (ref 20–55)
Iron: 39 ug/dL — ABNORMAL LOW (ref 42–165)
TIBC: 372 ug/dL (ref 215–435)
UIBC: 333 ug/dL (ref 125–400)

## 2014-11-12 LAB — HEMOGLOBIN A1C
Hgb A1c MFr Bld: 5.8 % — ABNORMAL HIGH (ref <5.7)
Mean Plasma Glucose: 120 mg/dL — ABNORMAL HIGH (ref <117)

## 2014-11-12 LAB — MAGNESIUM: MAGNESIUM: 1.7 mg/dL (ref 1.5–2.5)

## 2014-11-12 NOTE — Patient Instructions (Signed)

## 2014-11-12 NOTE — Progress Notes (Signed)
Patient ID: Ruben Mccormick, male   DOB: February 25, 1956, 59 y.o.   MRN: [id number] Annual Comprehensive Examination  This very nice 59 y.o. MWM presents for complete physical.  Patient has been followed for HTN, Prediabetes, Hyperlipidemia, and Vitamin D Deficiency.   HTN predates since 2005 and is being monitored expectantly. Patient's BP has been controlled at home.Today's BP is elevated at 160/90. Patient denies any cardiac symptoms as chest pain, palpitations, shortness of breath, dizziness or ankle swelling.   Patient's hyperlipidemia is controlled with diet and medications. Patient denies myalgias or other medication SE's. Last lipids were at goal - Total Chol 169; HDL 86; LDL  56; Trig 135 on 04/02/2014.   Patient has prediabetes since  Aug 2011 with A1c 5.9% and 6.0% in Dec 2011 and patient denies reactive hypoglycemic symptoms, visual blurring, diabetic polys or paresthesias. Last A1c was  5.7% on 04/02/2014.   Finally, patient has history of Vitamin D Deficiency of 27 in 2009 and last vitamin D was 88 on 04/02/2014.   Medication Sig  . aspirin 325 MG tablet Take 325 mg by mouth daily.  Marland Kitchen VITAMIN D 5000 UNITS TABS Take 5,000 Units by mouth daily.   Marland Kitchen DEPO-TESTOTERONE 200 MG/ML inj INJECT 2 MLS INTO THE MUSCLE EVERY 2 WEEKS  . triamcinolone cream (KENALOG) 0.1 %   . valACYclovir  500 MG tablet TAKE HALF TABLET BY MOUTH TWICE DAILY AS NEEDED FOR FEVER BLISTER.   Allergies  Allergen Reactions  . Bee Venom Anaphylaxis  . 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 Voltaren [Diclofenac Sodium] Rash   Past Medical History  Diagnosis Date  . Hypertension   . Hyperlipidemia   . Pre-diabetes   . Hypogonadism male   . Vitamin D deficiency    Health Maintenance  Topic Date Due  . TETANUS/TDAP  08/22/1972  . ZOSTAVAX  08/22/2013  . INFLUENZA VACCINE  03/11/2015  . COLONOSCOPY  07/10/2017  . HIV Screening  Completed   Immunization History  Administered Date(s) Administered  . DTaP  08/11/2003  . Pneumococcal Polysaccharide-23 08/10/1997   History reviewed. No pertinent past surgical history. Family History  Problem Relation Age of Onset  . Alzheimer's disease Mother   . Parkinson's disease Mother   . Diabetes Father   . Cancer Paternal Grandfather     colon    History   Social History  . Marital Status: Married    Spouse Name: N/A  . Number of Children: 3 children & 2 currently in college  . Years of Education: N/A   Occupational History  . In Sales    Social History Main Topics  . Smoking status: Current Every Day Smoker - continues to smoke 1 PPD despite recommendations to quit    Types: Cigarettes  . Smokeless tobacco: Never Used  . Alcohol Use: Admits 4-6 oz /daily    "drinking Heavy  . Drug Use: No  . Sexual Activity: Active    ROS Constitutional: Denies fever, chills, weight loss/gain, headaches, insomnia, fatigue, night sweats or change in appetite. Eyes: Denies redness, blurred vision, diplopia, discharge, itchy or watery eyes.  ENT: Denies discharge, congestion, post nasal drip, epistaxis, sore throat, earache, hearing loss, dental pain, Tinnitus, Vertigo, Sinus pain or snoring.  Cardio: Denies chest pain, palpitations, irregular heartbeat, syncope, dyspnea, diaphoresis, orthopnea, PND, claudication or edema Respiratory: denies cough, dyspnea, DOE, pleurisy, hoarseness, laryngitis or wheezing.  Gastrointestinal: Denies dysphagia, heartburn, reflux, water brash, pain, cramps, nausea, vomiting, bloating, diarrhea, constipation, hematemesis, melena, hematochezia, jaundice  or hemorrhoids Genitourinary: Denies dysuria, frequency, urgency, nocturia, hesitancy, discharge, hematuria or flank pain Musculoskeletal: Denies arthralgia, myalgia, stiffness, Jt. Swelling, pain, limp or strain/sprain. Denies Falls. Skin: Denies puritis, rash, hives, warts, acne, eczema or change in skin lesion Neuro: No weakness, tremor, incoordination, spasms, paresthesia  or pain Psychiatric: Denies confusion, memory loss or sensory loss. Denies Depression. Endocrine: Denies change in weight, skin, hair change, nocturia, and paresthesia, diabetic polys, visual blurring or hyper / hypo glycemic episodes.  Heme/Lymph: No excessive bleeding, bruising or enlarged lymph nodes.  Physical Exam  BP 160/90   Pulse 84  Temp 98.1 F   Resp 16  Ht 5' 7.5"   Wt 156 lb     BMI 24.06   General Appearance: Well nourished, in no apparent distress. Eyes: PERRLA, EOMs, conjunctiva no swelling or erythema, normal fundi and vessels. Sinuses: No frontal/maxillary tenderness ENT/Mouth: EACs patent / TMs  nl. Nares clear without erythema, swelling, mucoid exudates. Oral hygiene is good. No erythema, swelling, or exudate. Tongue normal, non-obstructing. Tonsils not swollen or erythematous. Hearing normal.  Neck: Supple, thyroid normal. No bruits, nodes or JVD. Respiratory: Respiratory effort normal.  BS equal and clear bilateral without rales, rhonci, wheezing or stridor. Cardio: Heart sounds are normal with regular rate and rhythm and no murmurs, rubs or gallops. Peripheral pulses are normal and equal bilaterally without edema. No aortic or femoral bruits. Chest: symmetric with normal excursions and percussion.  Abdomen: Flat, soft, with bowl sounds. Nontender, no guarding, rebound, hernias, masses, or organomegaly.  Lymphatics: Non tender without lymphadenopathy.  Genitourinary: No hernias.Testes nl. DRE - prostate nl for age - smooth & firm w/o nodules. Musculoskeletal: Full ROM all peripheral extremities, joint stability, 5/5 strength, and normal gait. Skin: Warm and dry without rashes, lesions, cyanosis, clubbing or  ecchymosis. (+) plethoric facies.  Neuro: Cranial nerves intact, reflexes equal bilaterally. Normal muscle tone, no cerebellar symptoms. Sensation intact.  Pysch: Awake and oriented X 3 with normal affect, insight and judgment appropriate.   Assessment and  Plan  1. Labile hypertension  -advised to monitor BP's more frequently and call if elevated.  - Microalbumin / creatinine urine ratio - EKG 12-Lead - [place], RETROPERITNL ABD,  LTD  2. Mixed hyperlipidemia  - Lipid panel  3. Prediabetes  - Hemoglobin A1c - Insulin, random  4. Vitamin D deficiency  - Vit D  25 hydroxy   5. Testosterone deficiency  - Testosterone  6. Screening for rectal cancer  - POC Hemoccult Bld/Stl (3-Cd Home Screen); Future  7. Prostate cancer screening  - PSA  8. Other fatigue  - Vitamin B12 - Iron and TIBC - TSH  9. Suspect Alcohol habituation   10. Medication management  - Urine Microscopic - CBC with Differential/Platelet - BASIC METABOLIC PANEL WITH GFR - Hepatic function panel - Magnesium   Continue prudent diet as discussed, weight control, BP monitoring, regular exercise, and medications as discussed.  Patient was strongly advised to restrict and d/c alcohol. Discussed med effects and SE's. Routine screening labs and tests as requested with regular follow-up as recommended. Over 40 minutes of exam, counseling &  chart review was performed

## 2014-11-13 ENCOUNTER — Telehealth: Payer: Self-pay

## 2014-11-13 LAB — PSA: PSA: 1.49 ng/mL (ref <=4.00)

## 2014-11-13 LAB — URINALYSIS, MICROSCOPIC ONLY
Bacteria, UA: NONE SEEN
Casts: NONE SEEN
Crystals: NONE SEEN
Squamous Epithelial / LPF: NONE SEEN

## 2014-11-13 LAB — MICROALBUMIN / CREATININE URINE RATIO
Creatinine, Urine: 135.5 mg/dL
MICROALB UR: 3.6 mg/dL — AB (ref <2.0)
Microalb Creat Ratio: 26.6 mg/g (ref 0.0–30.0)

## 2014-11-13 LAB — VITAMIN B12: VITAMIN B 12: 479 pg/mL (ref 211–911)

## 2014-11-13 LAB — VITAMIN D 25 HYDROXY (VIT D DEFICIENCY, FRACTURES): VIT D 25 HYDROXY: 57 ng/mL (ref 30–100)

## 2014-11-13 LAB — INSULIN, RANDOM: INSULIN: 23.1 u[IU]/mL — AB (ref 2.0–19.6)

## 2014-11-13 LAB — TESTOSTERONE: TESTOSTERONE: 366 ng/dL (ref 300–890)

## 2014-11-13 LAB — TSH: TSH: 0.969 u[IU]/mL (ref 0.350–4.500)

## 2014-11-13 NOTE — Telephone Encounter (Signed)
Dr. Melford Aase put order in for patient to get chest xray at [facility]. Patient can walk in for CXR. Patient aware.

## 2014-11-19 ENCOUNTER — Other Ambulatory Visit: Payer: Self-pay | Admitting: Internal Medicine

## 2014-11-19 ENCOUNTER — Ambulatory Visit (HOSPITAL_COMMUNITY)
Admission: RE | Admit: 2014-11-19 | Discharge: 2014-11-19 | Disposition: A | Discharge status: Home / Self Care | Payer: Commercial Managed Care - PPO | Source: Ambulatory Visit | Attending: Internal Medicine | Admitting: Internal Medicine

## 2014-11-19 DIAGNOSIS — J439 Emphysema, unspecified: Secondary | ICD-10-CM | POA: Diagnosis present

## 2014-11-19 DIAGNOSIS — R0989 Other specified symptoms and signs involving the circulatory and respiratory systems: Secondary | ICD-10-CM

## 2014-11-19 DIAGNOSIS — Z87891 Personal history of nicotine dependence: Secondary | ICD-10-CM | POA: Diagnosis not present

## 2015-02-05 ENCOUNTER — Other Ambulatory Visit (INDEPENDENT_AMBULATORY_CARE_PROVIDER_SITE_OTHER): Payer: Commercial Managed Care - PPO

## 2015-02-05 DIAGNOSIS — Z1212 Encounter for screening for malignant neoplasm of rectum: Secondary | ICD-10-CM

## 2015-02-05 LAB — POC HEMOCCULT BLD/STL (HOME/3-CARD/SCREEN)
Card #3 Fecal Occult Blood, POC: NEGATIVE
FECAL OCCULT BLD: NEGATIVE
Fecal Occult Blood, POC: NEGATIVE

## 2015-04-19 ENCOUNTER — Other Ambulatory Visit: Payer: Self-pay | Admitting: Internal Medicine

## 2015-04-19 NOTE — Telephone Encounter (Signed)
Called Rx into [facility].

## 2015-05-17 ENCOUNTER — Ambulatory Visit (INDEPENDENT_AMBULATORY_CARE_PROVIDER_SITE_OTHER): Payer: Commercial Managed Care - PPO | Admitting: Internal Medicine

## 2015-05-17 ENCOUNTER — Encounter: Payer: Self-pay | Admitting: Internal Medicine

## 2015-05-17 VITALS — BP 140/88 | HR 76 | Temp 98.0°F | Resp 18 | Ht 67.5 in | Wt 159.0 lb

## 2015-05-17 DIAGNOSIS — Z23 Encounter for immunization: Secondary | ICD-10-CM | POA: Diagnosis not present

## 2015-05-17 DIAGNOSIS — E559 Vitamin D deficiency, unspecified: Secondary | ICD-10-CM | POA: Diagnosis not present

## 2015-05-17 DIAGNOSIS — R7303 Prediabetes: Secondary | ICD-10-CM | POA: Diagnosis not present

## 2015-05-17 DIAGNOSIS — E782 Mixed hyperlipidemia: Secondary | ICD-10-CM

## 2015-05-17 DIAGNOSIS — I10 Essential (primary) hypertension: Secondary | ICD-10-CM | POA: Diagnosis not present

## 2015-05-17 DIAGNOSIS — R0989 Other specified symptoms and signs involving the circulatory and respiratory systems: Secondary | ICD-10-CM

## 2015-05-17 DIAGNOSIS — Z79899 Other long term (current) drug therapy: Secondary | ICD-10-CM

## 2015-05-17 LAB — CBC WITH DIFFERENTIAL/PLATELET
Basophils Absolute: 0.1 10*3/uL (ref 0.0–0.1)
Basophils Relative: 1 % (ref 0–1)
EOS ABS: 0.6 10*3/uL (ref 0.0–0.7)
EOS PCT: 9 % — AB (ref 0–5)
HCT: 47.5 % (ref 39.0–52.0)
Hemoglobin: 16.1 g/dL (ref 13.0–17.0)
Lymphocytes Relative: 24 % (ref 12–46)
Lymphs Abs: 1.7 10*3/uL (ref 0.7–4.0)
MCH: 32.5 pg (ref 26.0–34.0)
MCHC: 33.9 g/dL (ref 30.0–36.0)
MCV: 96 fL (ref 78.0–100.0)
MPV: 11.1 fL (ref 8.6–12.4)
Monocytes Absolute: 0.8 10*3/uL (ref 0.1–1.0)
Monocytes Relative: 11 % (ref 3–12)
Neutro Abs: 3.9 10*3/uL (ref 1.7–7.7)
Neutrophils Relative %: 55 % (ref 43–77)
PLATELETS: 233 10*3/uL (ref 150–400)
RBC: 4.95 MIL/uL (ref 4.22–5.81)
RDW: 15.4 % (ref 11.5–15.5)
WBC: 7.1 10*3/uL (ref 4.0–10.5)

## 2015-05-17 LAB — LIPID PANEL
Cholesterol: 146 mg/dL (ref 125–200)
HDL: 79 mg/dL (ref >=40)
LDL Cholesterol: 45 mg/dL (ref <130)
Total CHOL/HDL Ratio: 1.8 Ratio (ref <=5.0)
Triglycerides: 109 mg/dL (ref <150)
VLDL: 22 mg/dL (ref <30)

## 2015-05-17 LAB — BASIC METABOLIC PANEL WITH GFR
BUN: 9 mg/dL (ref 7–25)
CHLORIDE: 101 mmol/L (ref 98–110)
CO2: 31 mmol/L (ref 20–31)
Calcium: 8.5 mg/dL — ABNORMAL LOW (ref 8.6–10.3)
Creat: 0.87 mg/dL (ref 0.70–1.25)
GFR, Est African American: >89 mL/min (ref >=60)
GFR, Est Non African American: >89 mL/min (ref >=60)
GLUCOSE: 80 mg/dL (ref 65–99)
Potassium: 4.4 mmol/L (ref 3.5–5.3)
Sodium: 141 mmol/L (ref 135–146)

## 2015-05-17 LAB — MAGNESIUM: MAGNESIUM: 1.8 mg/dL (ref 1.5–2.5)

## 2015-05-17 LAB — HEMOGLOBIN A1C
HEMOGLOBIN A1C: 5.9 % — AB (ref <5.7)
MEAN PLASMA GLUCOSE: 123 mg/dL — AB (ref <117)

## 2015-05-17 LAB — HEPATIC FUNCTION PANEL
ALBUMIN: 3.8 g/dL (ref 3.6–5.1)
ALT: 22 U/L (ref 9–46)
AST: 24 U/L (ref 10–35)
Alkaline Phosphatase: 67 U/L (ref 40–115)
BILIRUBIN TOTAL: 0.5 mg/dL (ref 0.2–1.2)
Bilirubin, Direct: 0.1 mg/dL (ref <=0.2)
Indirect Bilirubin: 0.4 mg/dL (ref 0.2–1.2)
TOTAL PROTEIN: 5.7 g/dL — AB (ref 6.1–8.1)

## 2015-05-17 MED ORDER — BUPROPION HCL ER (XL) 150 MG PO TB24: 150.0000 mg | ORAL_TABLET | ORAL | Status: DC

## 2015-05-17 NOTE — Progress Notes (Signed)
Patient ID: Ruben Mccormick, male   DOB: Mar 02, 1956, 59 y.o.   MRN: [id number]  Assessment and Plan:  Hypertension:  -Continue medication,  -monitor blood pressure at home.  -Continue DASH diet.   -Reminder to go to the ER if any CP, SOB, nausea, dizziness, severe HA, changes vision/speech, left arm numbness and tingling, and jaw pain.  Cholesterol: -Continue diet and exercise.  -Check cholesterol.   Pre-diabetes: -Continue diet and exercise.  -Check A1C  Vitamin D Def: -check level -continue medications.   Need for Td -given today  Continue diet and meds as discussed. Further disposition pending results of labs.  HPI 59 y.o. male  presents for 3 month follow up with hypertension, hyperlipidemia, prediabetes and vitamin D.   His blood pressure has been controlled at home, today their BP is BP: 140/88 mmHg.   He does not workout. He denies chest pain, shortness of breath, dizziness.  He does a lot of yard work and walking.     He is not on cholesterol medication and denies myalgias. His cholesterol is at goal. The cholesterol last visit was:   Lab Results  Component Value Date   CHOL 150 11/12/2014   HDL 75 11/12/2014   LDLCALC 52 11/12/2014   TRIG 117 11/12/2014   CHOLHDL 2.0 11/12/2014     He has been working on diet and exercise for prediabetes, and denies foot ulcerations, hyperglycemia, hypoglycemia , increased appetite, nausea, paresthesia of the feet, polydipsia, polyuria, visual disturbances, vomiting and weight loss. Last A1C in the office was:  Lab Results  Component Value Date   HGBA1C 5.8* 11/12/2014    Patient is on Vitamin D supplement.  Lab Results  Component Value Date   VD25OH 57 11/12/2014      Current Medications:  Current Outpatient Prescriptions on File Prior to Visit  Medication Sig Dispense Refill  . aspirin 325 MG tablet Take 325 mg by mouth daily.    . B-D 3CC LUER-LOK SYR 21GX1" 21G X 1" 3 ML MISC USE AS DIRECTED FOR TESTOSTERONE 10 each 0   . Cholecalciferol (VITAMIN D-3) 5000 UNITS TABS Take 5,000 Units by mouth daily.     Marland Kitchen testosterone cypionate (DEPOTESTOSTERONE CYPIONATE) 200 MG/ML injection INJECT 2 ML IN THE MUSCLE EVERY 2 WEEKS 10 mL 1  . Triamcinolone Acetonide (TRIAMCINOLONE 0.1 % CREAM : EUCERIN) CREA Apply 1 application topically 2 (two) times daily. 1 each 11  . triamcinolone cream (KENALOG) 0.1 %   11  . valACYclovir (VALTREX) 500 MG tablet TAKE HALF TABLET BY MOUTH TWICE DAILY AS NEEDED FOR FEVER BLISTER. 30 tablet 6   No current facility-administered medications on file prior to visit.    Medical History:  Past Medical History  Diagnosis Date  . Hypertension   . Hyperlipidemia   . Pre-diabetes   . Hypogonadism male   . Vitamin D deficiency     Allergies:  Allergies  Allergen Reactions  . Bee Venom Anaphylaxis  . 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 Voltaren [Diclofenac Sodium] Rash     Review of Systems:  Review of Systems  Constitutional: Negative for fever, chills and malaise/fatigue.  HENT: Negative for congestion, ear pain and sore throat.   Respiratory: Positive for shortness of breath. Negative for cough and wheezing.   Cardiovascular: Negative for chest pain, palpitations and leg swelling.  Gastrointestinal: Positive for blood in stool. Negative for heartburn, diarrhea, constipation and melena.  Genitourinary: Negative.   Neurological: Negative for dizziness, sensory change, loss  of consciousness and headaches.  Psychiatric/Behavioral: Negative for depression. The patient is not nervous/anxious and does not have insomnia.     Family history- Review and unchanged  Social history- Review and unchanged  Physical Exam: BP 140/88 mmHg  Pulse 76  Temp(Src) 98 F (36.7 C) (Temporal)  Resp 18  Ht 5' 7.5" (1.715 m)  Wt 159 lb (72.122 kg)  BMI 24.52 kg/m2 Wt Readings from Last 3 Encounters:  05/17/15 159 lb (72.122 kg)  11/12/14 156 lb (70.761 kg)  04/02/14 160 lb (72.576  kg)    General Appearance: Well nourished well developed, in no apparent distress. Eyes: PERRLA, EOMs, conjunctiva no swelling or erythema ENT/Mouth: Ear canals normal without obstruction, swelling, erythma, discharge.  TMs normal bilaterally.  Oropharynx moist, clear, without exudate, or postoropharyngeal swelling. Neck: Supple, thyroid normal,no cervical adenopathy  Respiratory: Respiratory effort normal, Breath sounds clear A&P without rhonchi, wheeze, or rale.  No retractions, no accessory usage. Cardio: RRR with no MRGs. Brisk peripheral pulses without edema.  Abdomen: Soft, + BS,  Non tender, no guarding, rebound, hernias, masses. Musculoskeletal: Full ROM, 5/5 strength, Normal gait Skin: Cheeks ruddy and red with mild rhinophyma,  Warm, dry without rashes, lesions, ecchymosis.  Neuro: Awake and oriented X 3, Cranial nerves intact. Normal muscle tone, no cerebellar symptoms. Psych: Normal affect, Insight and Judgment appropriate.    Starlyn Skeans, PA-C 9:06 AM [facility]

## 2015-05-17 NOTE — Addendum Note (Signed)
Addended by: Sang Blount A on: 05/17/2015 09:40 AM   Modules accepted: Orders

## 2015-05-18 LAB — INSULIN, RANDOM: INSULIN: 2.4 u[IU]/mL (ref 2.0–19.6)

## 2015-05-18 LAB — TSH: TSH: 1.451 u[IU]/mL (ref 0.350–4.500)

## 2015-06-21 ENCOUNTER — Other Ambulatory Visit: Payer: Self-pay | Admitting: Internal Medicine

## 2015-08-19 ENCOUNTER — Ambulatory Visit: Payer: Self-pay | Admitting: Internal Medicine

## 2015-09-08 ENCOUNTER — Other Ambulatory Visit: Payer: Self-pay | Admitting: Physician Assistant

## 2015-10-10 ENCOUNTER — Encounter: Payer: Self-pay | Admitting: Internal Medicine

## 2015-12-05 ENCOUNTER — Ambulatory Visit (INDEPENDENT_AMBULATORY_CARE_PROVIDER_SITE_OTHER): Payer: Commercial Managed Care - PPO | Admitting: Internal Medicine

## 2015-12-05 ENCOUNTER — Encounter: Payer: Self-pay | Admitting: Internal Medicine

## 2015-12-05 ENCOUNTER — Other Ambulatory Visit: Payer: Self-pay

## 2015-12-05 ENCOUNTER — Ambulatory Visit (HOSPITAL_COMMUNITY)
Admission: RE | Admit: 2015-12-05 | Discharge: 2015-12-05 | Disposition: A | Discharge status: Home / Self Care | Payer: Commercial Managed Care - PPO | Source: Ambulatory Visit | Attending: Internal Medicine | Admitting: Internal Medicine

## 2015-12-05 VITALS — BP 140/80 | HR 95 | Temp 97.6°F | Resp 16 | Ht 67.0 in | Wt 168.0 lb

## 2015-12-05 DIAGNOSIS — Z125 Encounter for screening for malignant neoplasm of prostate: Secondary | ICD-10-CM

## 2015-12-05 DIAGNOSIS — Z136 Encounter for screening for cardiovascular disorders: Secondary | ICD-10-CM

## 2015-12-05 DIAGNOSIS — I10 Essential (primary) hypertension: Secondary | ICD-10-CM | POA: Insufficient documentation

## 2015-12-05 DIAGNOSIS — R7303 Prediabetes: Secondary | ICD-10-CM

## 2015-12-05 DIAGNOSIS — Z0001 Encounter for general adult medical examination with abnormal findings: Secondary | ICD-10-CM

## 2015-12-05 DIAGNOSIS — F17200 Nicotine dependence, unspecified, uncomplicated: Secondary | ICD-10-CM | POA: Diagnosis not present

## 2015-12-05 DIAGNOSIS — Z79899 Other long term (current) drug therapy: Secondary | ICD-10-CM

## 2015-12-05 DIAGNOSIS — Z1212 Encounter for screening for malignant neoplasm of rectum: Secondary | ICD-10-CM

## 2015-12-05 DIAGNOSIS — R0600 Dyspnea, unspecified: Secondary | ICD-10-CM

## 2015-12-05 DIAGNOSIS — E559 Vitamin D deficiency, unspecified: Secondary | ICD-10-CM

## 2015-12-05 DIAGNOSIS — E349 Endocrine disorder, unspecified: Secondary | ICD-10-CM

## 2015-12-05 DIAGNOSIS — J449 Chronic obstructive pulmonary disease, unspecified: Secondary | ICD-10-CM

## 2015-12-05 DIAGNOSIS — Z0000 Encounter for general adult medical examination without abnormal findings: Secondary | ICD-10-CM | POA: Diagnosis not present

## 2015-12-05 DIAGNOSIS — R490 Dysphonia: Secondary | ICD-10-CM

## 2015-12-05 DIAGNOSIS — E782 Mixed hyperlipidemia: Secondary | ICD-10-CM

## 2015-12-05 DIAGNOSIS — R5383 Other fatigue: Secondary | ICD-10-CM

## 2015-12-05 DIAGNOSIS — R0989 Other specified symptoms and signs involving the circulatory and respiratory systems: Secondary | ICD-10-CM

## 2015-12-05 LAB — LIPID PANEL
Cholesterol: 148 mg/dL (ref 125–200)
HDL: 73 mg/dL (ref >=40)
LDL Cholesterol: 50 mg/dL (ref <130)
TRIGLYCERIDES: 126 mg/dL (ref <150)
Total CHOL/HDL Ratio: 2 Ratio (ref <=5.0)
VLDL: 25 mg/dL (ref <30)

## 2015-12-05 LAB — CBC WITH DIFFERENTIAL/PLATELET
BASOS PCT: 1 %
Basophils Absolute: 72 cells/uL (ref 0–200)
Eosinophils Absolute: 288 cells/uL (ref 15–500)
Eosinophils Relative: 4 %
HEMATOCRIT: 45.4 % (ref 38.5–50.0)
Hemoglobin: 14.7 g/dL (ref 13.2–17.1)
Lymphocytes Relative: 23 %
Lymphs Abs: 1656 cells/uL (ref 850–3900)
MCH: 30.9 pg (ref 27.0–33.0)
MCHC: 32.4 g/dL (ref 32.0–36.0)
MCV: 95.6 fL (ref 80.0–100.0)
MONO ABS: 720 {cells}/uL (ref 200–950)
MPV: 11.5 fL (ref 7.5–12.5)
Monocytes Relative: 10 %
NEUTROS PCT: 62 %
Neutro Abs: 4464 cells/uL (ref 1500–7800)
Platelets: 295 10*3/uL (ref 140–400)
RBC: 4.75 MIL/uL (ref 4.20–5.80)
RDW: 14.1 % (ref 11.0–15.0)
WBC: 7.2 10*3/uL (ref 3.8–10.8)

## 2015-12-05 LAB — BASIC METABOLIC PANEL WITH GFR
BUN: 12 mg/dL (ref 7–25)
CO2: 29 mmol/L (ref 20–31)
CREATININE: 1.03 mg/dL (ref 0.70–1.25)
Calcium: 9 mg/dL (ref 8.6–10.3)
Chloride: 100 mmol/L (ref 98–110)
GFR, Est Non African American: 77 mL/min (ref >=60)
GLUCOSE: 97 mg/dL (ref 65–99)
POTASSIUM: 4.5 mmol/L (ref 3.5–5.3)
Sodium: 140 mmol/L (ref 135–146)

## 2015-12-05 LAB — IRON AND TIBC
%SAT: 11 % — AB (ref 15–60)
Iron: 45 ug/dL — ABNORMAL LOW (ref 50–180)
TIBC: 420 ug/dL (ref 250–425)
UIBC: 375 ug/dL (ref 125–400)

## 2015-12-05 LAB — HEPATIC FUNCTION PANEL
ALBUMIN: 3.8 g/dL (ref 3.6–5.1)
ALK PHOS: 95 U/L (ref 40–115)
ALT: 34 U/L (ref 9–46)
AST: 37 U/L — AB (ref 10–35)
Bilirubin, Direct: 0.1 mg/dL (ref <=0.2)
Indirect Bilirubin: 0.4 mg/dL (ref 0.2–1.2)
TOTAL PROTEIN: 5.8 g/dL — AB (ref 6.1–8.1)
Total Bilirubin: 0.5 mg/dL (ref 0.2–1.2)

## 2015-12-05 LAB — MAGNESIUM: Magnesium: 2 mg/dL (ref 1.5–2.5)

## 2015-12-05 NOTE — Patient Instructions (Signed)

## 2015-12-05 NOTE — Progress Notes (Signed)
Patient ID: Ruben Mccormick, male   DOB: 1956/01/13, 60 y.o.   MRN: [id number]  Annual  Screening/Preventative Visit And Comprehensive Evaluation & Examination     This very nice 60 y.o. MWM presents for a Wellness/Preventative Visit & comprehensive evaluation and management of multiple medical co-morbidities.  Patient has been followed for HTN, Prediabetes, Hyperlipidemia, Testosterone and Vitamin D Deficiency. Patient also has COPD by CRX and has a 68 -49 yr smoking hx/o 1 & 1/2 ppd and relates having quit in Feb 2017. Further he reports vacillating hoarseness over several months time. He also endorses exertional dyspnea.      HTN predates since 2005. Patient's BP has been controlled at home.Today's BP: 140/80 mmHg. Patient denies any cardiac symptoms as chest pain, palpitations, shortness of breath, dizziness or ankle swelling.     Patient's hyperlipidemia is controlled with diet. Patient denies myalgias or other medication SE's. Last lipids were Cholesterol 146; HDL 79; LDL 45; Triglycerides 109 on 05/17/2015.      Patient has prediabetes since 2011 with A1c 6.0% and patient denies reactive hypoglycemic symptoms, visual blurring, diabetic polys or paresthesias. Last A1c was 5.9% on 05/17/2015.     Patient has Low T and has been on Depo-Testosterone injections with last injection over 2 weeks ago. He also relates difficulty with tumescence. Finally, patient has history of Vitamin D Deficiency of "14" in 2008 and last vitamin D was 57 in April 2016.   Medication Sig  . aspirin 325 MG tablet Take 325 mg by mouth daily.  Marland Kitchen buPROPion XL 150 MG  Take 1 tablet (150 mg total) by mouth every morning.  Marland Kitchen VITAMIN D 5000 UNITS Take 5,000 Units by mouth daily.   Marland Kitchen testosterone cypionate 200 MG/ML inj INJECT 2 ML INTO THE MUSCLE EVERY 2 WEEKS  . triamcinolone crm 0.1 % Apply 1 application topically 2 (two) times daily.  . valACYclovir 500 MG tablet TAKE 1 TABTWICE DAILY AS NEEDED FOR FEVER BLISTER   Allergies   Allergen Reactions  . Bee Venom Anaphylaxis  . 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 Voltaren [Diclofenac Sodium] Rash   Past Medical History  Diagnosis Date  . Hypertension   . Hyperlipidemia   . Pre-diabetes   . Hypogonadism male   . Vitamin D deficiency    Health Maintenance  Topic Date Due  . ZOSTAVAX  05/10/2016 (Originally 08/22/2013)  . INFLUENZA VACCINE  05/11/2016 (Originally 03/10/2016)  . COLONOSCOPY  07/10/2017  . TETANUS/TDAP  05/16/2025  . Hepatitis C Screening  Completed  . HIV Screening  Completed   Immunization History  Administered Date(s) Administered  . DT 05/17/2015  . DTaP 08/11/2003  . Pneumococcal Polysaccharide-23 08/10/1997   No past surgical history on file. Family History  Problem Relation Age of Onset  . Alzheimer's disease Mother   . Parkinson's disease Mother   . Diabetes Father   . Cancer Paternal Grandfather     colon   Social History   Social History  . Marital Status: Married    Spouse Name: N/A  . Number of Children: N/A  . Years of Education: N/A   Occupational History  . Sales for a telephone [employer]    Social History Main Topics  . Smoking status: Current Every Day Smoker -- 1.00 packs/day    Types: Cigarettes  . Smokeless tobacco: Never Used  . Alcohol Use: 7.0 oz/week    14 drink(s) per week  . Drug Use: No  . Sexual Activity: Active  ROS Constitutional: Denies fever, chills, weight loss/gain, headaches, insomnia,  night sweats or change in appetite. Does c/o fatigue. Eyes: Denies redness, blurred vision, diplopia, discharge, itchy or watery eyes.  ENT: Denies discharge, congestion, post nasal drip, epistaxis, sore throat, earache, hearing loss, dental pain, Tinnitus, Vertigo, Sinus pain or snoring.  Cardio: Denies chest pain, palpitations, irregular heartbeat, syncope, dyspnea, diaphoresis, orthopnea, PND, claudication or edema Respiratory: denies cough, dyspnea, DOE, pleurisy, hoarseness, laryngitis or  wheezing.  Gastrointestinal: Denies dysphagia, heartburn, reflux, water brash, pain, cramps, nausea, vomiting, bloating, diarrhea, constipation, hematemesis, melena, hematochezia, jaundice or hemorrhoids Genitourinary: Denies dysuria, frequency, urgency, nocturia, hesitancy, discharge, hematuria or flank pain Musculoskeletal: Denies arthralgia, myalgia, stiffness, Jt. Swelling, pain, limp or strain/sprain. Denies Falls. Skin: Denies puritis, rash, hives, warts, acne, eczema or change in skin lesion Neuro: No weakness, tremor, incoordination, spasms, paresthesia or pain Psychiatric: Denies confusion, memory loss or sensory loss. Denies Depression. Endocrine: Denies change in weight, skin, hair change, nocturia, and paresthesia, diabetic polys, visual blurring or hyper / hypo glycemic episodes.  Heme/Lymph: No excessive bleeding, bruising or enlarged lymph nodes.  Physical Exam  BP 140/80 mmHg  Pulse 95  Temp(Src) 97.6 F (36.4 C) (Temporal)  Resp 16  Ht '5\' 7"'$  (1.702 m)  Wt 168 lb (76.204 kg)  BMI 26.31 kg/m2  SpO2 96%  General Appearance: Very flushed facies & in no apparent distress.  Eyes: PERRLA, EOMs, conjunctiva no swelling or erythema, normal fundi and vessels. Sinuses: No frontal/maxillary tenderness ENT/Mouth: EACs patent / TMs  nl. Nares clear without erythema, swelling, mucoid exudates. Oral hygiene is good. No erythema, swelling, or exudate. Tongue normal, non-obstructing. Tonsils not swollen or erythematous. Hearing normal.  Neck: Supple, thyroid normal. No bruits, nodes or JVD. Respiratory: Respiratory effort normal.  BS equal and clear bilateral without rales, rhonci, wheezing or stridor. Cardio: Heart sounds are normal with regular rate and rhythm and no murmurs, rubs or gallops. Peripheral pulses are normal and equal bilaterally without edema. No aortic or femoral bruits. Chest: symmetric with normal excursions and percussion.  Abdomen: Soft, with Nl bowel sounds.  Nontender, no guarding, rebound, hernias, masses, or organomegaly.  Lymphatics: Non tender without lymphadenopathy.  Genitourinary: No hernias.Testes nl. DRE - prostate nl for age - smooth & firm w/o nodules. Musculoskeletal: Full ROM all peripheral extremities, joint stability, 5/5 strength, and normal gait. Skin: Warm and dry without rashes, lesions, cyanosis, clubbing or  ecchymosis.  Neuro: Cranial nerves intact, reflexes equal bilaterally. Normal muscle tone, no cerebellar symptoms. Sensation intact.  Pysch: Alert and oriented X 3 with normal affect, insight and judgment appropriate.   Assessment and Plan  1. Annual Preventative/Screening Exam   - Microalbumin / creatinine urine ratio - EKG 12-Lead - [place], RETROPERITNL ABD,  LTD - POC Hemoccult Bld/Stl ( - Urinalysis, Routine w reflex microscopic - Iron and TIBC - PSA - Testosterone - CBC with Differential/Platelet - BASIC METABOLIC PANEL WITH GFR - Hepatic function panel - Magnesium - Lipid panel - TSH - Hemoglobin A1c - Insulin, random - VITAMIN D 25 Hydroxy   2. Labile hypertension  - Microalbumin / creatinine urine ratio - EKG 12-Lead - [place], RETROPERITNL ABD,  LTD - TSH  3. Mixed hyperlipidemia  - Lipid panel - TSH  4. Prediabetes  - Hemoglobin A1c - Insulin, random  5. Vitamin D deficiency  - VITAMIN D 25 Hydroxy   6. Testosterone deficiency  - Testosterone  7. Screening for rectal cancer  - POC Hemoccult Bld/Stl   8. Prostate  cancer screening  - PSA  9. Other fatigue  - Iron and TIBC - Testosterone - CBC with Differential/Platelet - TSH - Vitamin B12  10. Dyspnea  - DG Chest 2 View; Future  11. COPD  with chronic bronchitis ([place])  - DG Chest 2 View; Future  12. Hoarseness  - Ambulatory referral to ENT  13. Medication management  - Urinalysis, Routine w reflex microscopic - CBC with Differential/Platelet - BASIC METABOLIC PANEL WITH GFR - Hepatic function panel -  Magnesium  14. Screening for AAA (aortic abdominal aneurysm)   15. Screening for ischemic heart disease   Continue prudent diet as discussed, weight control, BP monitoring, regular exercise, and medications as discussed.  Discussed med effects and SE's. Routine screening labs and tests as requested with regular follow-up as recommended. Over 40 minutes of exam, counseling, chart review and high complex critical decision making was performed

## 2015-12-06 LAB — HEMOGLOBIN A1C
Hgb A1c MFr Bld: 6.1 % — ABNORMAL HIGH (ref <5.7)
MEAN PLASMA GLUCOSE: 128 mg/dL

## 2015-12-06 LAB — URINALYSIS, ROUTINE W REFLEX MICROSCOPIC
Bilirubin Urine: NEGATIVE
Glucose, UA: NEGATIVE
Hgb urine dipstick: NEGATIVE
KETONES UR: NEGATIVE
Leukocytes, UA: NEGATIVE
Nitrite: NEGATIVE
Specific Gravity, Urine: 1.023 (ref 1.001–1.035)
pH: 6 (ref 5.0–8.0)

## 2015-12-06 LAB — MICROALBUMIN / CREATININE URINE RATIO
CREATININE, URINE: 185 mg/dL (ref 20–370)
Microalb Creat Ratio: 113 mcg/mg creat — ABNORMAL HIGH (ref <30)
Microalb, Ur: 20.9 mg/dL

## 2015-12-06 LAB — PSA: PSA: 2.87 ng/mL (ref <=4.00)

## 2015-12-06 LAB — URINALYSIS, MICROSCOPIC ONLY
Bacteria, UA: NONE SEEN [HPF]
CASTS: NONE SEEN [LPF]
Crystals: NONE SEEN [HPF]
RBC / HPF: NONE SEEN RBC/HPF (ref <=2)
Squamous Epithelial / LPF: NONE SEEN [HPF] (ref <=5)
WBC, UA: NONE SEEN WBC/HPF (ref <=5)
YEAST: NONE SEEN [HPF]

## 2015-12-06 LAB — VITAMIN D 25 HYDROXY (VIT D DEFICIENCY, FRACTURES): Vit D, 25-Hydroxy: 52 ng/mL (ref 30–100)

## 2015-12-06 LAB — TESTOSTERONE: Testosterone: 656 ng/dL (ref 250–827)

## 2015-12-06 LAB — TSH: TSH: 1.79 m[IU]/L (ref 0.40–4.50)

## 2015-12-06 LAB — VITAMIN B12: Vitamin B-12: 1755 pg/mL — ABNORMAL HIGH (ref 200–1100)

## 2015-12-06 LAB — INSULIN, RANDOM: INSULIN: 18 u[IU]/mL (ref 2.0–19.6)

## 2016-01-10 ENCOUNTER — Ambulatory Visit (INDEPENDENT_AMBULATORY_CARE_PROVIDER_SITE_OTHER): Payer: Commercial Managed Care - PPO | Admitting: Internal Medicine

## 2016-01-10 ENCOUNTER — Encounter: Payer: Self-pay | Admitting: Internal Medicine

## 2016-01-10 VITALS — BP 128/90 | HR 72 | Temp 97.7°F | Resp 16 | Ht 67.0 in | Wt 168.6 lb

## 2016-01-10 DIAGNOSIS — R609 Edema, unspecified: Secondary | ICD-10-CM | POA: Diagnosis not present

## 2016-01-10 DIAGNOSIS — J309 Allergic rhinitis, unspecified: Secondary | ICD-10-CM | POA: Diagnosis not present

## 2016-01-10 DIAGNOSIS — I10 Essential (primary) hypertension: Secondary | ICD-10-CM | POA: Diagnosis not present

## 2016-01-10 DIAGNOSIS — L723 Sebaceous cyst: Secondary | ICD-10-CM

## 2016-01-10 DIAGNOSIS — R0989 Other specified symptoms and signs involving the circulatory and respiratory systems: Secondary | ICD-10-CM

## 2016-01-10 DIAGNOSIS — Z7189 Other specified counseling: Secondary | ICD-10-CM

## 2016-01-10 MED ORDER — FUROSEMIDE 40 MG PO TABS: ORAL_TABLET | ORAL | Status: DC

## 2016-01-10 MED ORDER — HYDROCODONE-ACETAMINOPHEN 5-325 MG PO TABS: ORAL_TABLET | ORAL | Status: AC

## 2016-01-10 NOTE — Progress Notes (Signed)
Subjective:    Patient ID: Ruben Mccormick, male    DOB: 1956/07/25, 60 y.o.   MRN: [id number]  HPIThis very nice 60 yo MWM w/hx/o labile HTN presented with elevated BP & c/o ankle edema for about 1-2 weeks. Denies change in diet , salt intake - etc, dyspnea or respiratory sx's. Also has c/o of a lump of his L upper back which is sore and has increased in size.   Medication Sig  . aspirin 325 MG tablet Take 325 mg by mouth daily.  Marland Kitchen VITAMIN D 5000 UNITS  Take 5,000 Units by mouth daily.   Marland Kitchen ibuprofen  800 MG tablet TK 1 T PO Q 6-8 H PRF MILD PAIN  . PREVIDENT 5000 BOOSTER PLUS    . testosterone cypionate  200 MG/ML inj INJECT 2 ML INTO THE MUSCLE EVERY 2 WEEKS  . triamcinolone cream  0.1 %   . valACYclovir  500 MG tablet TAKE 1 TABLET BY MOUTH TWICE DAILY AS NEEDED FOR FEVER BLISTER  . buPROPion-XL 150 MG 24 hr tablet Take 1 tablet (150 mg total) by mouth every morning.   Allergies  Allergen Reactions  . Bee Venom Anaphylaxis  . 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 Voltaren [Diclofenac Sodium] Rash   Past Medical History  Diagnosis Date  . Hypertension   . Hyperlipidemia   . Pre-diabetes   . Hypogonadism male   . Vitamin D deficiency    No past surgical history on file.  Review of Systems  10 point systems review negative except as above.    Objective:   Physical Exam  BP 128/90 mmHg  Pulse 72  Temp(Src) 97.7 F (36.5 C)  Resp 16  Ht '5\' 7"'$  (1.702 m)  Wt 168 lb 9.6 oz (76.476 kg)  BMI 26.40 kg/m2   In no distress.   HEENT - Eac's patent. TM's Nl. EOM's full. PERRLA. NasoOroPharynx clear. Neck - supple. Nl Thyroid. Carotids 2+ & No bruits, nodes, JVD Chest - Clear equal BS w/o Rales, rhonchi, wheezes. Cor - Nl HS. RRR w/o sig MGR. PP 1(+). 1-2 (+) ankle edema. MS- FROM w/o deformities. Muscle power, tone and bulk Nl. Gait Nl. Neuro - No obvious Cr N abnormalities. Sensory, motor and Cerebellar functions appear Nl w/o focal abnormalities. Shin - firm mobile  subcut tender mass ~ 2.5 x 3 cm over L scapula.  Procedure (CPT: 34742) - After informed consent and aseptic prep and local anesthesia  with 3.5 ml Marcaine 0.5% w/epi, the area was sharply dissected free in toto with a #10 scalpel delivering a firm fibrotic 2.5 x 3.0 cm sebaceous cyst. The broad area was approximated with # 10 interrupted vertical mattress sutures of Nylon 3-0 and then opposing wound edges were everted and aligned with # 10 interrupted sutures of Nylon 3-0 with total wound length of 5 cm or 2".  Sterile dsg was applied and covered with 4 x 6 " Tegaderm. Patient was instructed in wound care.     Assessment & Plan:   1. Labile hypertension   2. Edema, unspecified type  - furosemide (LASIX) 40 MG tablet; Take 1 tablet 1 or 2 x / day if needed for ankle swelling  Dispense: 60 tablet; Refill: 0 - monitor daily weights  - ROV 1 week to recheck BP & edema  3. Sebaceous cyst  - Excised   - HYDROcodone-acetaminophen (NORCO) 5-325 MG tablet; Take 1 tablet every 3 to 4 hours as needed for pain.  Dispense: 30  tablet; Refill: 0  4. History of participation in smoking cessation counseling  - buPROPion (WELLBUTRIN XL) 150 MG 24 hr tablet; ; Refill: 1  5. Allergic rhinitis, unspecified allergic rhinitis type  - fluticasone (FLONASE) 50 MCG/ACT nasal spray; INSTILL 2 SPRAYS INTO THE NOSTRILS QD AT NIGHT; Refill: 5

## 2016-01-13 ENCOUNTER — Other Ambulatory Visit: Payer: Self-pay | Admitting: *Deleted

## 2016-01-13 DIAGNOSIS — Z1212 Encounter for screening for malignant neoplasm of rectum: Secondary | ICD-10-CM

## 2016-01-13 DIAGNOSIS — Z0001 Encounter for general adult medical examination with abnormal findings: Secondary | ICD-10-CM

## 2016-01-13 LAB — POC HEMOCCULT BLD/STL (HOME/3-CARD/SCREEN)
Card #2 Fecal Occult Blod, POC: NEGATIVE
Card #3 Fecal Occult Blood, POC: NEGATIVE
FECAL OCCULT BLD: NEGATIVE

## 2016-01-17 ENCOUNTER — Encounter: Payer: Self-pay | Admitting: Internal Medicine

## 2016-01-17 ENCOUNTER — Ambulatory Visit (INDEPENDENT_AMBULATORY_CARE_PROVIDER_SITE_OTHER): Payer: Self-pay | Admitting: Internal Medicine

## 2016-01-17 VITALS — BP 122/80 | HR 84 | Temp 97.9°F | Resp 16 | Ht 67.0 in | Wt 164.8 lb

## 2016-01-17 DIAGNOSIS — L723 Sebaceous cyst: Secondary | ICD-10-CM

## 2016-01-17 NOTE — Progress Notes (Signed)
  Subjective:    Patient ID: Ruben Mccormick, male    DOB: Sep 13, 1955, 60 y.o.   MRN: [id number]  HPI  Patient returns 1 week s/p excision of a large sebaceous cyst of the Left upper posterior chest for partial suture removal.   Medication Sig  . aspirin 325 MG tablet Take 325 mg by mouth daily.  . B-D 3CC LUER-LOK SYR 21GX1" 21G X 1" 3 ML MISC USE AS DIRECTED FOR TESTOSTERONE  . buPROPion (WELLBUTRIN XL) 150 MG 24 hr tablet   . Cholecalciferol (VITAMIN D-3) 5000 UNITS TABS Take 5,000 Units by mouth daily.   . fluticasone (FLONASE) 50 MCG/ACT nasal spray INSTILL 2 SPRAYS INTO THE NOSTRILS QD AT NIGHT  . furosemide (LASIX) 40 MG tablet Take 1 tablet 1 or 2 x / day if needed for ankle swelling  . ibuprofen (ADVIL,MOTRIN) 800 MG tablet TK 1 T PO Q 6-8 H PRF MILD PAIN  . PREVIDENT 5000 BOOSTER PLUS 1.1 % PSTE   . testosterone cypionate (DEPOTESTOSTERONE CYPIONATE) 200 MG/ML injection INJECT 2 ML INTO THE MUSCLE EVERY 2 WEEKS  . Triamcinolone Acetonide (TRIAMCINOLONE 0.1 % CREAM : EUCERIN) CREA Apply 1 application topically 2 (two) times daily.  Marland Kitchen triamcinolone cream (KENALOG) 0.1 %   . valACYclovir (VALTREX) 500 MG tablet TAKE 1 TABLET BY MOUTH TWICE DAILY AS NEEDED FOR FEVER BLISTER   Past Medical History  Diagnosis Date  . Hypertension   . Hyperlipidemia   . Pre-diabetes   . Hypogonadism male   . Vitamin D deficiency    Review of Systems  10 point systems review negative except as above.    Objective:   Physical Exam BP 122/80 mmHg  Pulse 84  Temp(Src) 97.9 F (36.6 C)  Resp 16  Ht '5\' 7"'$  (1.702 m)  Wt 164 lb 12.8 oz (74.753 kg)  BMI 25.81 kg/m2  Wound site appears clean & healing w/ good wound competance & 1/2 of sutures (#10 ) removed.     Assessment & Plan:   1) s/p excision of large sebaceous cyst of back  - ROV 1 week to allow maximal wound healing due to size of wound closed and tension on wound edges.

## 2016-01-27 ENCOUNTER — Ambulatory Visit: Payer: Commercial Managed Care - PPO | Admitting: Internal Medicine

## 2016-01-27 ENCOUNTER — Encounter: Payer: Self-pay | Admitting: Internal Medicine

## 2016-01-27 VITALS — BP 122/86 | HR 88 | Temp 97.3°F | Resp 16 | Ht 67.0 in | Wt 160.8 lb

## 2016-01-27 DIAGNOSIS — L723 Sebaceous cyst: Secondary | ICD-10-CM

## 2016-01-27 NOTE — Progress Notes (Signed)
Patient ID: Ruben Mccormick, male   DOB: [birth year], 59 y.o.   MRN: [id number]  No infection Sutures removed  ROV- prn

## 2016-02-20 ENCOUNTER — Other Ambulatory Visit: Payer: Self-pay | Admitting: Otolaryngology

## 2016-03-06 ENCOUNTER — Encounter: Payer: Self-pay | Admitting: Internal Medicine

## 2016-03-06 ENCOUNTER — Ambulatory Visit (INDEPENDENT_AMBULATORY_CARE_PROVIDER_SITE_OTHER): Payer: Commercial Managed Care - PPO | Admitting: Internal Medicine

## 2016-03-06 VITALS — BP 128/80 | HR 98 | Temp 98.2°F | Resp 18 | Ht 67.0 in | Wt 167.0 lb

## 2016-03-06 DIAGNOSIS — K529 Noninfective gastroenteritis and colitis, unspecified: Secondary | ICD-10-CM | POA: Diagnosis not present

## 2016-03-06 DIAGNOSIS — R609 Edema, unspecified: Secondary | ICD-10-CM

## 2016-03-06 DIAGNOSIS — Z79899 Other long term (current) drug therapy: Secondary | ICD-10-CM | POA: Diagnosis not present

## 2016-03-06 DIAGNOSIS — R7303 Prediabetes: Secondary | ICD-10-CM

## 2016-03-06 DIAGNOSIS — I10 Essential (primary) hypertension: Secondary | ICD-10-CM

## 2016-03-06 DIAGNOSIS — R002 Palpitations: Secondary | ICD-10-CM

## 2016-03-06 DIAGNOSIS — E782 Mixed hyperlipidemia: Secondary | ICD-10-CM

## 2016-03-06 DIAGNOSIS — R0989 Other specified symptoms and signs involving the circulatory and respiratory systems: Secondary | ICD-10-CM

## 2016-03-06 LAB — CBC WITH DIFFERENTIAL/PLATELET
BASOS ABS: 74 {cells}/uL (ref 0–200)
Basophils Relative: 1 %
EOS PCT: 4 %
Eosinophils Absolute: 296 cells/uL (ref 15–500)
HCT: 45.5 % (ref 38.5–50.0)
HEMOGLOBIN: 14.1 g/dL (ref 13.2–17.1)
LYMPHS ABS: 1258 {cells}/uL (ref 850–3900)
Lymphocytes Relative: 17 %
MCH: 29.6 pg (ref 27.0–33.0)
MCHC: 31 g/dL — AB (ref 32.0–36.0)
MCV: 95.6 fL (ref 80.0–100.0)
MONOS PCT: 7 %
MPV: 11.1 fL (ref 7.5–12.5)
Monocytes Absolute: 518 cells/uL (ref 200–950)
NEUTROS PCT: 71 %
Neutro Abs: 5254 cells/uL (ref 1500–7800)
PLATELETS: 337 10*3/uL (ref 140–400)
RBC: 4.76 MIL/uL (ref 4.20–5.80)
RDW: 15.5 % — AB (ref 11.0–15.0)
WBC: 7.4 10*3/uL (ref 3.8–10.8)

## 2016-03-06 LAB — BASIC METABOLIC PANEL WITH GFR
BUN: 10 mg/dL (ref 7–25)
CALCIUM: 8.9 mg/dL (ref 8.6–10.3)
CO2: 28 mmol/L (ref 20–31)
CREATININE: 1 mg/dL (ref 0.70–1.25)
Chloride: 99 mmol/L (ref 98–110)
GFR, EST NON AFRICAN AMERICAN: 80 mL/min (ref >=60)
Glucose, Bld: 169 mg/dL — ABNORMAL HIGH (ref 65–99)
Potassium: 4.5 mmol/L (ref 3.5–5.3)
SODIUM: 136 mmol/L (ref 135–146)

## 2016-03-06 LAB — HEMOGLOBIN A1C
HEMOGLOBIN A1C: 6.1 % — AB (ref <5.7)
MEAN PLASMA GLUCOSE: 128 mg/dL

## 2016-03-06 LAB — HEPATIC FUNCTION PANEL
ALT: 28 U/L (ref 9–46)
AST: 29 U/L (ref 10–35)
Albumin: 3.4 g/dL — ABNORMAL LOW (ref 3.6–5.1)
Alkaline Phosphatase: 227 U/L — ABNORMAL HIGH (ref 40–115)
BILIRUBIN DIRECT: 0.2 mg/dL (ref <=0.2)
BILIRUBIN INDIRECT: 0.5 mg/dL (ref 0.2–1.2)
BILIRUBIN TOTAL: 0.7 mg/dL (ref 0.2–1.2)
Total Protein: 6 g/dL — ABNORMAL LOW (ref 6.1–8.1)

## 2016-03-06 MED ORDER — ELUXADOLINE 100 MG PO TABS: 1.0000 | ORAL_TABLET | Freq: Two times a day (BID) | ORAL | 0 refills | Status: DC

## 2016-03-06 MED ORDER — HYDROCHLOROTHIAZIDE 25 MG PO TABS: 25.0000 mg | ORAL_TABLET | Freq: Every day | ORAL | 0 refills | Status: DC

## 2016-03-06 NOTE — Progress Notes (Signed)
Patient ID: Ruben Mccormick, male   DOB: June 03, 1956, 60 y.o.   MRN: [id number]  Assessment and Plan:  Hypertension:  -Continue medication,  -monitor blood pressure at home.  -Continue DASH diet.   -Reminder to go to the ER if any CP, SOB, nausea, dizziness, severe HA, changes vision/speech, left arm numbness and tingling, and jaw pain.  Cholesterol: -Continue diet and exercise.  -Check cholesterol.   Pre-diabetes: -Continue diet and exercise.  -Check A1C  Vitamin D Def: -check level -continue medications.   Chronic Diarrhea -failing on cholestryramine -try viberzi -fecal fat -stool culture  Palpitations -EKG at most recent visit okay -offered holter monitor and cardiology visit which patient declined  Peripheral edema -stop lasix -try HCTZ 25 mg daily.  Continue diet and meds as discussed. Further disposition pending results of labs.  HPI 60 y.o. male  presents for 3 month follow up with hypertension, hyperlipidemia, prediabetes and vitamin D.   His blood pressure has been controlled at home, today their BP is BP: 128/80.   He does workout. He denies chest pain, shortness of breath, dizziness.   He is on cholesterol medication and denies myalgias. His cholesterol is at goal. The cholesterol last visit was:   Lab Results  Component Value Date   CHOL 148 12/05/2015   HDL 73 12/05/2015   LDLCALC 50 12/05/2015   TRIG 126 12/05/2015   CHOLHDL 2.0 12/05/2015     He has been working on diet and exercise for prediabetes, and denies foot ulcerations, hyperglycemia, hypoglycemia , increased appetite, nausea, paresthesia of the feet, polydipsia, polyuria, visual disturbances, vomiting and weight loss. Last A1C in the office was:  Lab Results  Component Value Date   HGBA1C 6.1 (H) 12/05/2015    Patient is on Vitamin D supplement.  Lab Results  Component Value Date   VD25OH 71 12/05/2015     He reports that he has been having a lot of swelling lately in both his legs and  also around his eyes in the morning.  He reports that this is getting worse.  He reports that he cannot tolerate the lasix and work.    He reports that he is having some palpitations which makes his neck feel like it is pounding.  It is something that comes and goes. It is infrequent.  No CP.  NO shortness of breath.       Current Medications:  Current Outpatient Prescriptions on File Prior to Visit  Medication Sig Dispense Refill  . aspirin 325 MG tablet Take 325 mg by mouth daily.    . B-D 3CC LUER-LOK SYR 21GX1" 21G X 1" 3 ML MISC USE AS DIRECTED FOR TESTOSTERONE 10 each 0  . Cholecalciferol (VITAMIN D-3) 5000 UNITS TABS Take 5,000 Units by mouth daily.     . fluticasone (FLONASE) 50 MCG/ACT nasal spray INSTILL 2 SPRAYS INTO THE NOSTRILS QD AT NIGHT  5  . furosemide (LASIX) 40 MG tablet Take 1 tablet 1 or 2 x / day if needed for ankle swelling 60 tablet 0  . ibuprofen (ADVIL,MOTRIN) 800 MG tablet TK 1 T PO Q 6-8 H PRF MILD PAIN  0  . PREVIDENT 5000 BOOSTER PLUS 1.1 % PSTE   3  . testosterone cypionate (DEPOTESTOSTERONE CYPIONATE) 200 MG/ML injection INJECT 2 ML INTO THE MUSCLE EVERY 2 WEEKS 10 mL 5  . Triamcinolone Acetonide (TRIAMCINOLONE 0.1 % CREAM : EUCERIN) CREA Apply 1 application topically 2 (two) times daily. 1 each 11  . triamcinolone cream (KENALOG)  0.1 %   11  . valACYclovir (VALTREX) 500 MG tablet TAKE 1 TABLET BY MOUTH TWICE DAILY AS NEEDED FOR FEVER BLISTER 90 tablet 1   No current facility-administered medications on file prior to visit.     Medical History:  Past Medical History:  Diagnosis Date  . Hyperlipidemia   . Hypertension   . Hypogonadism male   . Pre-diabetes   . Vitamin D deficiency     Allergies:  Allergies  Allergen Reactions  . Bee Venom Anaphylaxis  . 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 Voltaren [Diclofenac Sodium] Rash     Review of Systems:  ROS  Family history- Review and unchanged  Social history- Review and  unchanged  Physical Exam: BP 128/80   Pulse 98   Temp 98.2 F (36.8 C) (Temporal)   Resp 18   Ht '5\' 7"'$  (1.702 m)   Wt 167 lb (75.8 kg)   BMI 26.16 kg/m  Wt Readings from Last 3 Encounters:  03/06/16 167 lb (75.8 kg)  01/27/16 160 lb 12.8 oz (72.9 kg)  01/17/16 164 lb 12.8 oz (74.8 kg)    General Appearance: Well nourished well developed, in no apparent distress. Eyes: PERRLA, EOMs, conjunctiva no swelling or erythema ENT/Mouth: Ear canals normal without obstruction, swelling, erythma, discharge.  TMs normal bilaterally.  Oropharynx moist, clear, without exudate, or postoropharyngeal swelling. Neck: Supple, thyroid normal,no cervical adenopathy  Respiratory: Respiratory effort normal, Breath sounds clear A&P without rhonchi, wheeze, or rale.  No retractions, no accessory usage. Cardio: RRR with no MRGs. Brisk peripheral pulses without edema.  Abdomen: Soft, + BS,  Non tender, no guarding, rebound, hernias, masses. Musculoskeletal: Full ROM, 5/5 strength, Normal gait Skin: Warm, dry without rashes, lesions, ecchymosis.  Neuro: Awake and oriented X 3, Cranial nerves intact. Normal muscle tone, no cerebellar symptoms. Psych: Normal affect, Insight and Judgment appropriate.    Starlyn Skeans, PA-C 9:18 AM [facility]

## 2016-03-06 NOTE — Patient Instructions (Signed)
Eluxadoline oral tablets What is this medicine? ELUXADOLINE (ee LUX ah dol ine) is an intestinal disorder drug. It is used to treat irritable bowel syndrome with diarrhea. This medicine may be used for other purposes; ask your health care provider or pharmacist if you have questions. What should I tell my health care provider before I take this medicine? They need to know if you have any of these conditions: -drink more than 3 alcohol-containing drinks per day -gallbladder disease -history of drug or alcohol abuse problems -history of pancreatitis or pancreatic disease -liver disease -stomach or intestine problems -an unusual or allergic reaction to eluxadoline, other medicines, foods, dyes, or preservatives -pregnant or trying to get pregnant -breast-feeding How should I use this medicine? Take this medicine by mouth with a glass of water. Follow the directions on the prescription label. Take this medicine with food. Take your medicine at regular intervals. Do not take it more often than directed. Do not stop taking except on your doctor's advice. Talk to your pediatrician regarding the use of this medicine in children. Special care may be needed. Overdosage: If you think you have taken too much of this medicine contact a poison control center or emergency room at once. NOTE: This medicine is only for you. Do not share this medicine with others. What if I miss a dose? If you miss a dose, take it as soon as you can. If it is almost time for your next dose, take only that dose. Do not take double or extra doses. What may interact with this medicine? This medicine may interact with the following medications: -alosetron -anticholinergics -bupropion -certain antibiotics like ciprofloxacin, clarithromycin, rifampin -cyclosporine -eltrombopag -ergot alkaloids like dihydroergotamine, ergonovine, ergotamine, methylergonovine -fluconazole -gemfibrozil -loperamide -opiate  agonist -paroxetine -pimozide -probenecid -quinidine -rosuvastatin -sirolimus -tacrolimus This list may not describe all possible interactions. Give your health care provider a list of all the medicines, herbs, non-prescription drugs, or dietary supplements you use. Also tell them if you smoke, drink alcohol, or use illegal drugs. Some items may interact with your medicine. What should I watch for while using this medicine? Tell your doctor or healthcare professional if your symptoms do not start to get better or if they get worse. This medicine may cause constipation. If you do not have a bowel movement, call your doctor or healthcare professional. You may get drowsy or dizzy. Do not drive, use machinery, or do anything that needs mental alertness until you know how this medicine affects you. Do not stand or sit up quickly, especially if you are an older patient. This reduces the risk of dizzy or fainting spells. Alcohol may interfere with the effect of this medicine. Avoid alcoholic drinks. What side effects may I notice from receiving this medicine? Side effects that you should report to your doctor or health care professional as soon as possible: -allergic reactions like skin rash, itching or hives, swelling of the face, lips, or tongue -breathing problems -constipation -stomach pain Side effects that usually do not require medical attention (Report these to your doctor or health care professional if they continue or are bothersome.): -dizziness -nausea, vomiting -tiredness This list may not describe all possible side effects. Call your doctor for medical advice about side effects. You may report side effects to FDA at 1-800-FDA-1088. Where should I keep my medicine? Keep out of the reach of children. Store at room temperature between 20 and 25 degrees C (68 and 77 degrees F). Throw away any unused medicine after the expiration  date. NOTE: This sheet is a summary. It may not cover all  possible information. If you have questions about this medicine, talk to your doctor, pharmacist, or health care provider.    2016, Elsevier/Gold Standard. (2014-01-10 14:54:26)

## 2016-03-13 LAB — STOOL CULTURE

## 2016-03-16 LAB — FECAL FAT, QUALITATIVE: FECAL FAT QUALITATIVE: NORMAL

## 2016-04-10 ENCOUNTER — Other Ambulatory Visit: Payer: Commercial Managed Care - PPO

## 2016-04-10 DIAGNOSIS — R945 Abnormal results of liver function studies: Principal | ICD-10-CM

## 2016-04-10 DIAGNOSIS — R7989 Other specified abnormal findings of blood chemistry: Secondary | ICD-10-CM

## 2016-04-10 LAB — HEPATIC FUNCTION PANEL
ALT: 25 U/L (ref 9–46)
AST: 36 U/L — AB (ref 10–35)
Albumin: 3.6 g/dL (ref 3.6–5.1)
Alkaline Phosphatase: 185 U/L — ABNORMAL HIGH (ref 40–115)
BILIRUBIN DIRECT: 0.3 mg/dL — AB (ref <=0.2)
BILIRUBIN TOTAL: 0.8 mg/dL (ref 0.2–1.2)
Indirect Bilirubin: 0.5 mg/dL (ref 0.2–1.2)
Total Protein: 5.8 g/dL — ABNORMAL LOW (ref 6.1–8.1)

## 2016-04-11 ENCOUNTER — Other Ambulatory Visit: Payer: Self-pay | Admitting: Internal Medicine

## 2016-04-11 DIAGNOSIS — R945 Abnormal results of liver function studies: Principal | ICD-10-CM

## 2016-04-11 DIAGNOSIS — R7989 Other specified abnormal findings of blood chemistry: Secondary | ICD-10-CM

## 2016-04-16 ENCOUNTER — Encounter: Payer: Self-pay | Admitting: Internal Medicine

## 2016-04-22 ENCOUNTER — Ambulatory Visit
Admission: RE | Admit: 2016-04-22 | Discharge: 2016-04-22 | Disposition: A | Discharge status: Home / Self Care | Payer: Commercial Managed Care - PPO | Source: Ambulatory Visit | Attending: Internal Medicine | Admitting: Internal Medicine

## 2016-04-22 DIAGNOSIS — R7989 Other specified abnormal findings of blood chemistry: Secondary | ICD-10-CM

## 2016-04-22 DIAGNOSIS — R945 Abnormal results of liver function studies: Principal | ICD-10-CM

## 2016-04-24 ENCOUNTER — Ambulatory Visit
Admission: RE | Admit: 2016-04-24 | Discharge: 2016-04-24 | Disposition: A | Discharge status: Home / Self Care | Payer: Commercial Managed Care - PPO | Source: Ambulatory Visit | Attending: Internal Medicine | Admitting: Internal Medicine

## 2016-04-26 ENCOUNTER — Other Ambulatory Visit: Payer: Self-pay | Admitting: Internal Medicine

## 2016-04-26 DIAGNOSIS — R160 Hepatomegaly, not elsewhere classified: Secondary | ICD-10-CM

## 2016-04-27 ENCOUNTER — Other Ambulatory Visit: Payer: Self-pay | Admitting: Internal Medicine

## 2016-04-29 ENCOUNTER — Other Ambulatory Visit: Payer: Commercial Managed Care - PPO

## 2016-05-01 ENCOUNTER — Ambulatory Visit
Admission: RE | Admit: 2016-05-01 | Discharge: 2016-05-01 | Disposition: A | Discharge status: Home / Self Care | Payer: Commercial Managed Care - PPO | Source: Ambulatory Visit | Attending: Internal Medicine | Admitting: Internal Medicine

## 2016-05-01 ENCOUNTER — Other Ambulatory Visit: Payer: Self-pay | Admitting: Internal Medicine

## 2016-05-01 DIAGNOSIS — R160 Hepatomegaly, not elsewhere classified: Secondary | ICD-10-CM

## 2016-05-01 MED ORDER — IOPAMIDOL (ISOVUE-300) INJECTION 61%
100.0000 mL | Freq: Once | INTRAVENOUS | Status: AC | PRN
  Administered 2016-05-01: 100 mL via INTRAVENOUS

## 2016-05-03 ENCOUNTER — Other Ambulatory Visit: Payer: Self-pay | Admitting: Internal Medicine

## 2016-05-03 DIAGNOSIS — R911 Solitary pulmonary nodule: Secondary | ICD-10-CM

## 2016-05-03 DIAGNOSIS — K769 Liver disease, unspecified: Secondary | ICD-10-CM

## 2016-05-05 ENCOUNTER — Encounter: Payer: Self-pay | Admitting: Internal Medicine

## 2016-05-05 ENCOUNTER — Other Ambulatory Visit: Payer: Self-pay | Admitting: Internal Medicine

## 2016-05-05 ENCOUNTER — Ambulatory Visit (INDEPENDENT_AMBULATORY_CARE_PROVIDER_SITE_OTHER): Payer: Commercial Managed Care - PPO | Admitting: Internal Medicine

## 2016-05-05 VITALS — BP 128/86 | HR 84 | Temp 98.2°F | Resp 16 | Ht 67.0 in | Wt 154.0 lb

## 2016-05-05 DIAGNOSIS — C799 Secondary malignant neoplasm of unspecified site: Secondary | ICD-10-CM

## 2016-05-05 DIAGNOSIS — C801 Malignant (primary) neoplasm, unspecified: Secondary | ICD-10-CM | POA: Diagnosis not present

## 2016-05-05 NOTE — Progress Notes (Signed)
Patient presented at the office today at might request to go over CT abdomen and pelvis which was recently done for suspicious liver lesions.  Patient was found to have liver masses and also a lung mass which was spiculated.  Likely metastatic cancer.   Patient and his wife were present and we discussed the results of the CT scan.  At this time we have ordered a PET scan from skull base to the femurs to determine all areas of involvement.  I will also place a referral to oncology.  Patient will likely need a biopsy and we will defer to oncology to determine appropriate place to biopsy whether it is liver vs. Lung.  Patient was encouraged to continue with lasix as he does have some ascites on imaging studies.  We will see back after PET scan completed.  Patient told to avoid alcohol use and tylenol use. He was also recommended that he quit smoking.

## 2016-05-08 ENCOUNTER — Encounter: Payer: Self-pay | Admitting: Hematology

## 2016-05-12 ENCOUNTER — Other Ambulatory Visit: Payer: Self-pay | Admitting: Internal Medicine

## 2016-05-12 DIAGNOSIS — C799 Secondary malignant neoplasm of unspecified site: Secondary | ICD-10-CM

## 2016-05-15 ENCOUNTER — Ambulatory Visit (HOSPITAL_COMMUNITY)
Admission: RE | Admit: 2016-05-15 | Discharge: 2016-05-15 | Disposition: A | Discharge status: Home / Self Care | Payer: Commercial Managed Care - PPO | Source: Ambulatory Visit | Attending: Internal Medicine | Admitting: Internal Medicine

## 2016-05-15 ENCOUNTER — Ambulatory Visit (HOSPITAL_BASED_OUTPATIENT_CLINIC_OR_DEPARTMENT_OTHER): Payer: Commercial Managed Care - PPO | Admitting: Hematology

## 2016-05-15 ENCOUNTER — Encounter: Payer: Self-pay | Admitting: Hematology

## 2016-05-15 ENCOUNTER — Telehealth: Payer: Self-pay

## 2016-05-15 DIAGNOSIS — K769 Liver disease, unspecified: Secondary | ICD-10-CM | POA: Diagnosis not present

## 2016-05-15 DIAGNOSIS — J984 Other disorders of lung: Secondary | ICD-10-CM | POA: Diagnosis not present

## 2016-05-15 DIAGNOSIS — Z800 Family history of malignant neoplasm of digestive organs: Secondary | ICD-10-CM

## 2016-05-15 DIAGNOSIS — R160 Hepatomegaly, not elsewhere classified: Secondary | ICD-10-CM

## 2016-05-15 DIAGNOSIS — C799 Secondary malignant neoplasm of unspecified site: Secondary | ICD-10-CM | POA: Diagnosis not present

## 2016-05-15 DIAGNOSIS — Z808 Family history of malignant neoplasm of other organs or systems: Secondary | ICD-10-CM

## 2016-05-15 DIAGNOSIS — R911 Solitary pulmonary nodule: Secondary | ICD-10-CM | POA: Diagnosis not present

## 2016-05-15 DIAGNOSIS — B181 Chronic viral hepatitis B without delta-agent: Secondary | ICD-10-CM

## 2016-05-15 DIAGNOSIS — I10 Essential (primary) hypertension: Secondary | ICD-10-CM

## 2016-05-15 DIAGNOSIS — R918 Other nonspecific abnormal finding of lung field: Secondary | ICD-10-CM | POA: Diagnosis not present

## 2016-05-15 DIAGNOSIS — Z87891 Personal history of nicotine dependence: Secondary | ICD-10-CM

## 2016-05-15 LAB — GLUCOSE, CAPILLARY: Glucose-Capillary: 108 mg/dL — ABNORMAL HIGH (ref 65–99)

## 2016-05-15 MED ORDER — FLUDEOXYGLUCOSE F - 18 (FDG) INJECTION: 7.4000 | Freq: Once | INTRAVENOUS | Status: DC | PRN

## 2016-05-15 NOTE — Progress Notes (Signed)
[place]  [phone] Fax:([phone]) [place] Note   Patient Care Team: Unk Pinto, MD as PCP - General (Internal Medicine) 05/15/2016  Referral physician: Alesia Richards, MD  CHIEF COMPLAINTS/PURPOSE OF CONSULTATION:  Liver lesions   HISTORY OF PRESENTING ILLNESS:     Ruben Mccormick 60 y.o. male with past medical history of hepatitis B, hypertension, heavy smoking, is here because of his recent abnormal CT scan, which showed multiple liver lesions concerning for malignancy. He was referred by his primary care physician Dr. Melford Aase.    He has had diarrhea for 1.5 years, it has been getting worse lately, up 20 times a day. He was seen by Dr. Melford Aase and started taking Viberzi in 02/2016 which helps a lot. He underwent abdominal ultrasound, which showed a large lesion in the right lobe of the liver. He subsequently underwent abdominal and pelvis CT with contrast, which showed a dominant 7.6 cm necrotic mass in the inferior right lobe, and multiple other small lesions in the liver.   His last colonoscopy was 5 years ago which was normal per patient. He denies significant abdominal pain or discomfort, no melano or hemachizia, but he has mild heart burn and gassy feeling from [employer]. No other symptoms. He has chronic mild dyspnia from smoking. He lost about 25 lbs in the past 4-5 months. 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   He lives with his wife, has two daughters and one son. His previous hepatitis B test showed positive S antigen and E antibody, hepatitis C negative. Patient is on not aware of his prior hepatitis B infection. He did use cocaine, but no IV drugs. No history of blood transfusion.  MEDICAL HISTORY:  Past Medical History:  Diagnosis Date  . Hyperlipidemia   . Hypertension   . Hypogonadism male   . Pre-diabetes   . Vitamin D deficiency      SURGICAL HISTORY: Past Surgical History:  Procedure Laterality Date  . APPENDECTOMY      SOCIAL HISTORY: Social History   Social History  . Marital status: Married    Spouse name: N/A  . Number of children: N/A  . Years of education: N/A   Occupational History  . Not on file.   Social History Main Topics  . Smoking status: Former Smoker    Packs/day: 1.00    Years: 40.00    Types: Cigarettes    Quit date: 09/11/2015  . Smokeless tobacco: Never Used  . Alcohol use 16.8 oz/week    14 Standard drinks or equivalent, 14 Shots of liquor per week     Comment: 2 drinks daily since young   . Drug use: No     Comment: cocaine in the past, no iv drugs   . Sexual activity: Not on file   Other Topics Concern  . Not on file   Social History Narrative  . No narrative on file    FAMILY HISTORY: Family History  Problem Relation Age of Onset  . Diabetes Father   . Cancer Father     mesothelioma   . Alzheimer's disease Mother   . Parkinson's disease Mother   . Cancer Paternal Grandfather     colon    ALLERGIES:  is allergic to bee venom; ppd [tuberculin purified protein derivative]; and voltaren [diclofenac sodium].  MEDICATIONS:  Current Outpatient Prescriptions  Medication Sig Dispense Refill  .  aspirin 325 MG tablet Take 325 mg by mouth daily.    . B-D 3CC LUER-LOK SYR 21GX1" 21G X 1" 3 ML MISC USE AS DIRECTED FOR TESTOSTERONE 10 each 0  . Cholecalciferol (VITAMIN D-3) 5000 UNITS TABS Take 5,000 Units by mouth daily.     . Eluxadoline (VIBERZI) 100 MG TABS Take 1 tablet by mouth 2 (two) times daily. 180 tablet 0  . fluticasone (FLONASE) 50 MCG/ACT nasal spray INSTILL 2 SPRAYS INTO THE NOSTRILS QD AT NIGHT  5  . PREVIDENT 5000 BOOSTER PLUS 1.1 % PSTE   3  . Triamcinolone Acetonide (TRIAMCINOLONE 0.1 % CREAM : EUCERIN) CREA Apply 1 application topically 2 (two) times daily. 1 each 11  . triamcinolone cream (KENALOG) 0.1 %   11  . hydrochlorothiazide (HYDRODIURIL) 25  MG tablet Take 1 tablet (25 mg total) by mouth daily. (Patient not taking: Reported on 05/15/2016) 90 tablet 0  . testosterone cypionate (DEPOTESTOSTERONE CYPIONATE) 200 MG/ML injection INJECT 2 ML INTO THE MUSCLE EVERY 2 WEEKS (Patient not taking: Reported on 05/15/2016) 10 mL 5  . valACYclovir (VALTREX) 500 MG tablet TAKE 1 TABLET BY MOUTH TWICE DAILY AS NEEDED FOR FEVER BLISTER (Patient not taking: Reported on 05/15/2016) 90 tablet 1   No current facility-administered medications for this visit.    Facility-Administered Medications Ordered in Other Visits  Medication Dose Route Frequency Provider Last Rate Last Dose  . fludeoxyglucose F - 18 (FDG) injection 7.4 millicurie  7.4 millicurie Intravenous Once PRN David A Martinique, MD        REVIEW OF SYSTEMS:   Constitutional: Denies fevers, chills or abnormal night sweats,  (+) weight loss  Eyes: Denies blurriness of vision, double vision or watery eyes Ears, nose, mouth, throat, and face: Denies mucositis or sore throat Respiratory: Denies cough, dyspnea or wheezes Cardiovascular: Denies palpitation, chest discomfort or lower extremity swelling Gastrointestinal:  Denies nausea, heartburn or change in bowel habits Skin: Denies abnormal skin rashes Lymphatics: Denies new lymphadenopathy or easy bruising Neurological:Denies numbness, tingling or new weaknesses Behavioral/Psych: Mood is stable, no new changes  All other systems were reviewed with the patient and are negative.  PHYSICAL EXAMINATION: ECOG PERFORMANCE STATUS: 1 - Symptomatic but completely ambulatory  Vitals:   05/15/16 1103  BP: (!) 154/96  Pulse: 80  Resp: 17  Temp: 97.9 F (36.6 C)   Filed Weights   05/15/16 1103  Weight: 149 lb 6.4 oz (67.8 kg)    GENERAL:alert, no distress and comfortable SKIN: skin color, texture, turgor are normal, no rashes or significant lesions EYES: normal, conjunctiva are pink and non-injected, sclera clear OROPHARYNX:no exudate, no  erythema and lips, buccal mucosa, and tongue normal  NECK: supple, thyroid normal size, non-tender, without nodularity LYMPH:  no palpable lymphadenopathy in the cervical, axillary or inguinal LUNGS: clear to auscultation and percussion with normal breathing effort HEART: regular rate & rhythm and no murmurs and no lower extremity edema ABDOMEN:abdomen soft, non-tender and normal bowel sounds, no organomegaly. Rectal exam was negative for palpable mass, no blood on the tip of gloves   Musculoskeletal:no cyanosis of digits and no clubbing  PSYCH: alert & oriented x 3 with fluent speech NEURO: no focal motor/sensory deficits  LABORATORY DATA:  I have reviewed the data as listed CBC Latest Ref Rng & Units 03/06/2016 12/05/2015 05/17/2015  WBC 3.8 - 10.8 K/uL 7.4 7.2 7.1  Hemoglobin 13.2 - 17.1 g/dL 14.1 14.7 16.1  Hematocrit 38.5 - 50.0 % 45.5 45.4 47.5  Platelets 140 -  400 K/uL 337 295 233   CMP Latest Ref Rng & Units 04/10/2016 03/06/2016 12/05/2015  Glucose 65 - 99 mg/dL - 169(H) 97  BUN 7 - 25 mg/dL - 10 12  Creatinine 0.70 - 1.25 mg/dL - 1.00 1.03  Sodium 135 - 146 mmol/L - 136 140  Potassium 3.5 - 5.3 mmol/L - 4.5 4.5  Chloride 98 - 110 mmol/L - 99 100  CO2 20 - 31 mmol/L - 28 29  Calcium 8.6 - 10.3 mg/dL - 8.9 9.0  Total Protein 6.1 - 8.1 g/dL 5.8(L) 6.0(L) 5.8(L)  Total Bilirubin 0.2 - 1.2 mg/dL 0.8 0.7 0.5  Alkaline Phos 40 - 115 U/L 185(H) 227(H) 95  AST 10 - 35 U/L 36(H) 29 37(H)  ALT 9 - 46 U/L 25 28 34     RADIOGRAPHIC STUDIES: I have personally reviewed the radiological images as listed and agreed with the findings in the report. Ct Abdomen Pelvis W Wo Contrast  Result Date: 05/01/2016 CLINICAL DATA:  Liver lesions seen on ultrasound. EXAM: CT ABDOMEN AND PELVIS WITHOUT AND WITH CONTRAST TECHNIQUE: Multidetector CT imaging of the abdomen and pelvis was performed following the standard protocol before and following the bolus administration of intravenous contrast. CONTRAST:   177m ISOVUE-300 IOPAMIDOL (ISOVUE-300) INJECTION 61% COMPARISON:  Ultrasound exam 04/24/2016 FINDINGS: Lower chest: 16 mm spiculated nodule in the right lower lobe has been incompletely visualized (image 1 series 9). Hepatobiliary: Arterial phase imaging shows marked reflux of contrast into the IVC and hepatic veins, suggesting right heart dysfunction. Hypervascular centrally necrotic 7.0 x 7.6 cm mass is identified in the inferior right liver (segment V). This lesion shows peripheral washout of enhancement to a greater degree than background liver parenchyma on delayed imaging. 10 mm hypervascular lesion identified lateral segment left liver. 18 mm irregularly enhancing lesion is identified in the subcapsular aspect of the posterior right liver. 3.5 cm lesion adjacent to the IVC shows peripheral nodular enhancement after IV contrast administration. 7 mm enhancing nodule identified towards the dome of the liver. Gallbladder is decompressed. No intrahepatic or extrahepatic biliary dilation. Pancreas: No focal mass lesion. No dilatation of the main duct. No intraparenchymal cyst. No peripancreatic edema. Spleen: No splenomegaly. No focal mass lesion. Adrenals/Urinary Tract: No enhancing lesion in either kidney. Adrenal glands are unremarkable. No evidence for hydroureter. The urinary bladder appears normal for the degree of distention. Stomach/Bowel: Stomach is nondistended. No gastric wall thickening. No evidence of outlet obstruction. Duodenum is normally positioned as is the ligament of Treitz. No small bowel wall thickening. No small bowel dilatation. Right colon is not well demonstrated on this study, but there appears to be a short segment of circumferential wall thickening in the terminal ileum and potentially in the hepatic flexure. Left colon is unremarkable. Vascular/Lymphatic: There is abdominal aortic atherosclerosis without aneurysm. Borderline lymph nodes are noted in the hepatoduodenal ligament. No  retroperitoneal lymphadenopathy. No pelvic sidewall lymphadenopathy although borderline enlarged right external iliac lymph node is identified. Reproductive: Prostate gland is enlarged. Other: Small to moderate volume ascites identified in the abdomen and pelvis. Musculoskeletal: Multiple lucent bone lesions are identified in the anatomic pelvis and lumbar spine. IMPRESSION: 1. Dominant 7.6 cm necrotic mass in the inferior right liver shows irregular peripheral enhancement, consistent with neoplasm. In the setting of cirrhosis or chronic hepatitis-B infection, hepatocellular carcinoma would be a distinct concern. However, liver is not frankly cirrhotic in appearance and the patient is noted to have a 16 mm spiculated right lower lobe nodule (incompletely  visualized), an area of possible wall thickening in the right colon, and multiple lucent bone lesions. Given these findings, metastatic involvement in the liver must also be considered in the patient has several other small hypervascular liver lesions evident. 2. CT chest recommended for more definitive characterization of the incompletely visualized right lower lobe lesion has primary lung cancer or metastatic disease to the lung is a concern. 3. Correlation with colorectal cancer screening history recommended. 4. Small to moderate volume ascites. 5. Reflux of intravenous contrast down the IVC and hepatic veins suggests right heart dysfunction. 6. PET-CT may prove helpful to further evaluate. Electronically Signed   By: Misty Stanley M.D.   On: 05/01/2016 16:48   Nm Pet Image Initial (pi) Skull Base To Thigh  Result Date: 05/15/2016 CLINICAL DATA:  Initial treatment strategy for liver mass. NUCLEAR MEDICINE PET SKULL BASE TO THIGH TECHNIQUE: 7.4 mCi F-18 FDG was injected intravenously. Full-ring PET imaging was performed from the skull base to thigh after the radiotracer. CT data was obtained and used for attenuation correction and anatomic localization.  FASTING BLOOD GLUCOSE:  Value: 108 mg/dl COMPARISON:  CT 05/01/2016, ultrasound 9157 FINDINGS: NECK No hypermetabolic lymph nodes in the neck CHEST Within the RIGHT lower lobe rounded nodule centrally measuring 15 mm (image 51, series 8) has intense metabolic activity SUV max equal 9.1. 11 mm ground-glass nodule RIGHT upper lobe (image 20 series 8). No hypermetabolic mediastinal lymph nodes. ABDOMEN/PELVIS Multiple enhancing lesion described on comparison exam did not have associated metabolic activity above background liver activity. In particular the dominant lesion in the RIGHT hepatic lobe. Adrenal glands are normal. Pancreas spleen and kidneys and no abnormal metabolic activity. No hypermetabolic lymph nodes in the abdomen pelvis. There is intense metabolic activity associated distal ileum which is likely physiologic. Uptake in the gastric body is also likely physiologic. SKELETON No focal hypermetabolic activity to suggest skeletal metastasis. IMPRESSION: 1. Intense metabolic activity associated with the RIGHT lower lobe pulmonary nodule is concerning for bronchogenic carcinoma. No evidence of mediastinal nodal metastasis or distant metastasis (potential stage IA). 2. Ground-glass nodule RIGHT upper lobe is indeterminate. This lesion will require follow-up. 3. No metabolic activity associated with the enhancing hepatic lesions in LEFT and RIGHT hepatic lobe demonstrated on comparison contrast CT exam. This favors a benign etiology for these lesions which likely represent hemangiomas. Due to high concern for the larger RIGHT hepatic lesion consider percutaneous biopsy if the pulmonary lesion is found to be malignant. Recommend correlation with appropriate tumor markers additionally. 4. Favored Physiologic activity within the distal ileum and stomach. Electronically Signed   By: Suzy Bouchard M.D.   On: 05/15/2016 16:18   US Abdomen Limited Ruq  Result Date: 04/24/2016 CLINICAL DATA:  Elevated LFTs. No  abdominal pain. Chronic diarrhea. EXAM: US ABDOMEN LIMITED - RIGHT UPPER QUADRANT COMPARISON:  None. FINDINGS: Gallbladder: Gallbladder is contracted as the wall 4.1 mm in thickness. No cholelithiasis/ sludge or pericholecystic fluid. Negative sonographic Murphy sign. Common bile duct: Diameter: 3.9 mm. Liver: Numerous homogeneous and heterogeneous hyperechoic masses with the largest over the right lobe measuring 8.9 cm in diameter. Some to and fro flow within the portal vein. Mild ascites/perihepatic fluid. IMPRESSION: Numerous homogeneous and heterogeneous hyperechoic liver masses with the largest measuring 8.9 cm over the right lobe. Associated ascites. This may be due to multiple benign hemangiomas/adenomas, although cannot exclude metastatic disease. Recommend CT with contrast for further evaluation. Contracted gallbladder with mild nonspecific wall thickening likely due to the associated ascites.  These results will be called to the ordering clinician or representative by the Radiologist Assistant, and communication documented in the PACS or zVision Dashboard. Electronically Signed   By: Marin Olp M.D.   On: 04/24/2016 18:37    ASSESSMENT & PLAN:  59 year old male with past medical history of hypertension, hepatitis B infection, presented with worsening diarrhea for one half years, fatigue, anorexia and weight loss.  1. Liver and lung lesions, suspicion for malignancy -I reviewed his recent CT abdomen and pelvis scan images as patient in person  -We discussed that his CT scan is highly suspicious for primary liver malignancy or metastasis. He did have hepatitis B infection, but has been having normal liver functions, likely hepatitis B carrier. No imaging evidence of liver cirrhosis, HCC is a possibility but less likely. The other differential including cholangiocarcinoma, metastatic carcinoma from lung, stomach or colon. -He has a heavy smoking history, and a CT scan showed a 1.6 cm mass in the  right lower lobe of lung, highly suspicious for malignancy. -His PET scan was done of this clinic visit later today. It showed hypermetabolic right lower lobe lung nodule, highly suspicious for primary lung cancer. No significant hilar or mediastinal lymphadenopathy. This additional small groundglass nodule in the right upper lobe, indeterminate. -However his multiple liver lesions are not hypermetabolic on the PET scan, could be a secondary primary, versus benign lesions. -I recommend a abdominal MRI with and without contrast to further in divided his liver lesions -I'll contact pulmonology to see if the right lobe lung nodule can be biopsied through a bronchoscopy -Given his chronic diarrhea, and the liver lesions, I also recommend him to have a EGD and colonoscopy. He was seen by Dr. Deatra Ina before, I'll refer him back to [facility] -I'll discuss prognosis and treatment options will we have definitive diagnosis.  2. HTN, hepatitis B carrier -He will continue follow-up with his primary care physician   Plan -pulmonary referral for bronchoscopy  -GI referral for EGD and colonoscopy -abd MRI w wo contrast, liver biopsy will be determined after the MRI  -I will see him back after the above testing    All questions were answered. The patient knows to call the clinic with any problems, questions or concerns. I spent 55 minutes counseling the patient face to face. The total time spent in the appointment was 60 minutes and more than 50% was on counseling.     Truitt Merle, MD 05/15/2016

## 2016-05-15 NOTE — Telephone Encounter (Signed)
Appointments made,avs and calendar printed for patient

## 2016-05-18 ENCOUNTER — Other Ambulatory Visit: Payer: Self-pay | Admitting: Hematology

## 2016-05-18 ENCOUNTER — Telehealth: Payer: Self-pay

## 2016-05-18 DIAGNOSIS — R918 Other nonspecific abnormal finding of lung field: Secondary | ICD-10-CM

## 2016-05-18 NOTE — Telephone Encounter (Signed)
Appt has been scheduled with Amy for 05/26/16.   230 pm  Pt has been notified and will keep appt as scheduled.

## 2016-05-18 NOTE — Telephone Encounter (Signed)
-----   Message from Milus Banister, MD sent at 05/17/2016  8:23 AM EDT ----- [name] get him in.   Ruben Mccormick, Former Financial planner patient; he needs NGI appt with first available provider (extender or MD) in next 7-10 days.  If no openings please double book with me on a day I"m not already double booked.  Thanks  For diarrhea, anorexia, weight loss.   ----- Message ----- From: Truitt Merle, MD Sent: 05/15/2016  10:32 PM To: Milus Banister, MD, [name], MD  [name],  This pt was referred to [place] by his PCP for liver lesions. PET scan showed a hypermetabolic 1.6OM RLL lung nodule, but liver lesions were not hypermetabolic at all. He has chronic diarrhea for 1.5 years, much worse lately, with anorexia and weight loss. He is a heavy smoker.   I don't know if he has two primaries. Could you help me to biopsy the lung lesion through bronchoscopy, and do colonoscopy with or without EGD to rule out GI primary?   I will get a liver MRI .   Thanks much  [employer]

## 2016-05-20 ENCOUNTER — Encounter: Payer: Self-pay | Admitting: Pulmonary Disease

## 2016-05-20 ENCOUNTER — Ambulatory Visit (INDEPENDENT_AMBULATORY_CARE_PROVIDER_SITE_OTHER): Payer: Commercial Managed Care - PPO | Admitting: Pulmonary Disease

## 2016-05-20 VITALS — BP 124/82 | HR 89 | Ht 67.0 in | Wt 148.8 lb

## 2016-05-20 DIAGNOSIS — R160 Hepatomegaly, not elsewhere classified: Secondary | ICD-10-CM | POA: Diagnosis not present

## 2016-05-20 DIAGNOSIS — R911 Solitary pulmonary nodule: Secondary | ICD-10-CM | POA: Diagnosis not present

## 2016-05-20 DIAGNOSIS — R188 Other ascites: Secondary | ICD-10-CM

## 2016-05-20 NOTE — Progress Notes (Signed)
Subjective:    Patient ID: Ruben Mccormick, male    DOB: 1956/06/02, 60 y.o.   MRN: [id number]  HPI  Chief Complaint  Patient presents with  . Pulmonary Consult    Referral by Dr. Burr Medico; PET scan showed lung nodules/ Poss Cancer?    60 year old smoker presents for evaluation of lung nodule noted incidentally on abdominal imaging.  He smokes about a pack per day for 40 years-about 40 pack years until he quit in February 2017. He also drinks 2-3 mixed drinks daily. He carries a diagnosis of hepatitis B and has not been treated for this. He reports chronic diarrhea for which she underwent an ultrasound abdomen by his PCP-this showed liver lesions.  CT abdomen 04/2016 clarified 7.6 cm necrotic mass in the right liver, with underlying cirrhosis and mild ascites, there were smaller hypervascular other lesions in the liver. 16 mm spiculated lesion was noted in the right lower lobe. There was some reflux of contrast down the IVC suggesting right heart dysfunction  PET scan 05/2016 showed hypermetabolism in the dominant liver nodule and in the right lower lobe lesion. Smaller nodules in the liver did not light up  He reports abdominal distention and leg swelling which resolved after taking diuretics. He has lost 17 pounds over the last 6 months. He distress over these diagnoses, he started smoking again 3 weeks ago and is back on a nicotine patch now. He continues to drink. 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 Hyperlipidemia   . Hypertension   . Hypogonadism male   . Pre-diabetes   . Vitamin D deficiency    Past Surgical History:  Procedure Laterality Date  . APPENDECTOMY       Allergies  Allergen Reactions  . Bee Venom Anaphylaxis  . 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 Voltaren [Diclofenac Sodium] Rash    Social History   Social History  . Marital status: Married      Spouse name: N/A  . Number of children: N/A  . Years of education: N/A   Occupational History  . Not on file.   Social History Main Topics  . Smoking status: Former Smoker    Packs/day: 1.00    Years: 40.00    Types: Cigarettes    Quit date: 09/11/2015  . Smokeless tobacco: Never Used  . Alcohol use 16.8 oz/week    14 Standard drinks or equivalent, 14 Shots of liquor per week     Comment: 2 drinks daily since young   . Drug use: No     Comment: cocaine in the past, no iv drugs   . Sexual activity: Not on file   Other Topics Concern  . Not on file   Social History Narrative  . No narrative on file     Family History  Problem Relation Age of Onset  . Diabetes Father   . Cancer Father     mesothelioma   . Alzheimer's disease Mother   . Parkinson's disease Mother   . Cancer Paternal Grandfather     colon    Review of Systems Constitutional: negative for anorexia, fevers and sweats  Eyes: negative for irritation, redness and visual disturbance  Ears, nose, mouth, throat, and face: negative for earaches, epistaxis, nasal congestion and sore throat  Respiratory: negative for cough, dyspnea on exertion, sputum and wheezing  Cardiovascular: negative  for chest pain, dyspnea, lower extremity edema, orthopnea, palpitations and syncope  Gastrointestinal: negative for abdominal pain, constipation, melena, nausea and vomiting  Genitourinary:negative for dysuria, frequency and hematuria  Hematologic/lymphatic: negative for bleeding, easy bruising and lymphadenopathy  Musculoskeletal:negative for arthralgias, muscle weakness and stiff joints  Neurological: negative for coordination problems, gait problems, headaches and weakness  Endocrine: negative for diabetic symptoms including polydipsia, polyuria and weight loss     Objective:   Physical Exam   Gen. Pleasant, well-nourished, in no distress,ruddy complexion,anxious affect ENT - no palor, icterus, no post nasal  drip Neck: No JVD, no thyromegaly, no carotid bruits Lungs: no use of accessory muscles, no dullness to percussion, clear without rales or rhonchi  Cardiovascular: Rhythm regular, heart sounds  normal, no murmurs or gallops, no peripheral edema Abdomen: soft and non-tender, no hepatosplenomegaly, BS normal. Musculoskeletal: No deformities, no cyanosis or clubbing Neuro:  alert, non focal        Assessment & Plan:

## 2016-05-20 NOTE — Patient Instructions (Signed)
We discussed finding of a lung nodule in right lower part of the lung, which is likely cancer-either primary or metastatic  You will need lung biopsy  Schedule-breathing test -Blood work today -CT chest no contrast -Echocardiogram

## 2016-05-20 NOTE — Progress Notes (Signed)
   Subjective:    Patient ID: Dajohn Ellender, male    DOB: June 29, 1956, 60 y.o.   MRN: [id number]  HPI    Review of Systems  Constitutional: Negative for activity change, appetite change, chills, fever and unexpected weight change.  HENT: Negative for congestion, dental problem, postnasal drip, rhinorrhea, sneezing, sore throat, trouble swallowing and voice change.   Eyes: Negative for visual disturbance.  Respiratory: Positive for shortness of breath. Negative for cough and choking.   Cardiovascular: Negative for chest pain and leg swelling.  Gastrointestinal: Negative for abdominal pain, nausea and vomiting.  Genitourinary: Negative for difficulty urinating.  Musculoskeletal: Negative for arthralgias.  Skin: Negative for rash.  Psychiatric/Behavioral: Negative for behavioral problems and confusion.       Objective:   Physical Exam        Assessment & Plan:

## 2016-05-20 NOTE — Assessment & Plan Note (Signed)
In general, would prefer for him to have colonoscopy and liver biopsy first establish diagnosis of liver lesion and then proceed with lung biopsy

## 2016-05-20 NOTE — Assessment & Plan Note (Addendum)
We discussed finding of a lung nodule in right lower part of the lung, which is likely cancer-either primary or metastatic  You will need lung biopsy - very likely bronchoscopy with navigation -this will need general anesthesia  Schedule-PFTs to evaluate for resectability -CBC, CMET, INR -CT chest no contrast-super D format -Echocardiogram - right heart dysfunction noted on CT

## 2016-05-22 ENCOUNTER — Ambulatory Visit (INDEPENDENT_AMBULATORY_CARE_PROVIDER_SITE_OTHER): Payer: Commercial Managed Care - PPO | Admitting: Pulmonary Disease

## 2016-05-22 DIAGNOSIS — R188 Other ascites: Secondary | ICD-10-CM | POA: Diagnosis not present

## 2016-05-22 LAB — PULMONARY FUNCTION TEST
DL/VA % PRED: 62 %
DL/VA: 2.78 ml/min/mmHg/L
DLCO cor % pred: 55 %
DLCO cor: 15.97 ml/min/mmHg
DLCO unc % pred: 54 %
DLCO unc: 15.79 ml/min/mmHg
FEF 25-75 Post: 0.8 L/sec
FEF 25-75 Pre: 0.41 L/sec
FEF2575-%CHANGE-POST: 95 %
FEF2575-%PRED-PRE: 15 %
FEF2575-%Pred-Post: 30 %
FEV1-%Change-Post: 40 %
FEV1-%PRED-PRE: 30 %
FEV1-%Pred-Post: 42 %
FEV1-POST: 1.38 L
FEV1-PRE: 0.98 L
FEV1FVC-%CHANGE-POST: 12 %
FEV1FVC-%Pred-Pre: 55 %
FEV6-%Change-Post: 25 %
FEV6-%PRED-PRE: 55 %
FEV6-%Pred-Post: 69 %
FEV6-POST: 2.8 L
FEV6-PRE: 2.23 L
FEV6FVC-%Change-Post: 0 %
FEV6FVC-%PRED-POST: 100 %
FEV6FVC-%PRED-PRE: 100 %
FVC-%CHANGE-POST: 25 %
FVC-%PRED-POST: 68 %
FVC-%PRED-PRE: 54 %
FVC-POST: 2.94 L
FVC-PRE: 2.34 L
POST FEV6/FVC RATIO: 95 %
PRE FEV1/FVC RATIO: 42 %
Post FEV1/FVC ratio: 47 %
Pre FEV6/FVC Ratio: 95 %
RV % PRED: 182 %
RV: 3.95 L
TLC % PRED: 100 %
TLC: 6.56 L

## 2016-05-24 ENCOUNTER — Ambulatory Visit (HOSPITAL_COMMUNITY)
Admission: RE | Admit: 2016-05-24 | Discharge: 2016-05-24 | Disposition: A | Discharge status: Home / Self Care | Payer: Commercial Managed Care - PPO | Source: Ambulatory Visit | Attending: Hematology | Admitting: Hematology

## 2016-05-24 DIAGNOSIS — K7290 Hepatic failure, unspecified without coma: Secondary | ICD-10-CM | POA: Diagnosis not present

## 2016-05-24 DIAGNOSIS — R160 Hepatomegaly, not elsewhere classified: Secondary | ICD-10-CM | POA: Insufficient documentation

## 2016-05-24 DIAGNOSIS — K769 Liver disease, unspecified: Secondary | ICD-10-CM | POA: Insufficient documentation

## 2016-05-24 LAB — POCT I-STAT CREATININE: CREATININE: 0.9 mg/dL (ref 0.61–1.24)

## 2016-05-24 MED ORDER — GADOBENATE DIMEGLUMINE 529 MG/ML IV SOLN
13.0000 mL | Freq: Once | INTRAVENOUS | Status: AC | PRN
  Administered 2016-05-24: 13 mL via INTRAVENOUS

## 2016-05-26 ENCOUNTER — Other Ambulatory Visit (INDEPENDENT_AMBULATORY_CARE_PROVIDER_SITE_OTHER): Payer: Commercial Managed Care - PPO

## 2016-05-26 ENCOUNTER — Ambulatory Visit (INDEPENDENT_AMBULATORY_CARE_PROVIDER_SITE_OTHER): Payer: Commercial Managed Care - PPO | Admitting: Physician Assistant

## 2016-05-26 ENCOUNTER — Encounter: Payer: Self-pay | Admitting: Physician Assistant

## 2016-05-26 VITALS — BP 126/76 | HR 88 | Ht 67.0 in | Wt 149.0 lb

## 2016-05-26 DIAGNOSIS — R634 Abnormal weight loss: Secondary | ICD-10-CM

## 2016-05-26 DIAGNOSIS — R932 Abnormal findings on diagnostic imaging of liver and biliary tract: Secondary | ICD-10-CM | POA: Diagnosis not present

## 2016-05-26 DIAGNOSIS — R6881 Early satiety: Secondary | ICD-10-CM | POA: Diagnosis not present

## 2016-05-26 LAB — COMPREHENSIVE METABOLIC PANEL
ALBUMIN: 3.8 g/dL (ref 3.5–5.2)
ALK PHOS: 259 U/L — AB (ref 39–117)
ALT: 23 U/L (ref 0–53)
AST: 29 U/L (ref 0–37)
BILIRUBIN TOTAL: 0.6 mg/dL (ref 0.2–1.2)
BUN: 13 mg/dL (ref 6–23)
CALCIUM: 9.3 mg/dL (ref 8.4–10.5)
CO2: 33 mEq/L — ABNORMAL HIGH (ref 19–32)
Chloride: 101 mEq/L (ref 96–112)
Creatinine, Ser: 0.89 mg/dL (ref 0.40–1.50)
GFR: 91.83 mL/min (ref >60.00)
GLUCOSE: 97 mg/dL (ref 70–99)
POTASSIUM: 4.3 meq/L (ref 3.5–5.1)
Sodium: 140 mEq/L (ref 135–145)
TOTAL PROTEIN: 6.6 g/dL (ref 6.0–8.3)

## 2016-05-26 LAB — CBC WITH DIFFERENTIAL/PLATELET
BASOS ABS: 0 10*3/uL (ref 0.0–0.1)
Basophils Relative: 0.5 % (ref 0.0–3.0)
EOS PCT: 5.4 % — AB (ref 0.0–5.0)
Eosinophils Absolute: 0.4 10*3/uL (ref 0.0–0.7)
HEMATOCRIT: 46.4 % (ref 39.0–52.0)
Hemoglobin: 15.1 g/dL (ref 13.0–17.0)
LYMPHS PCT: 24.8 % (ref 12.0–46.0)
Lymphs Abs: 1.7 10*3/uL (ref 0.7–4.0)
MCHC: 32.6 g/dL (ref 30.0–36.0)
MCV: 91.3 fl (ref 78.0–100.0)
MONOS PCT: 10.2 % (ref 3.0–12.0)
Monocytes Absolute: 0.7 10*3/uL (ref 0.1–1.0)
Neutro Abs: 3.9 10*3/uL (ref 1.4–7.7)
Neutrophils Relative %: 59.1 % (ref 43.0–77.0)
Platelets: 177 10*3/uL (ref 150.0–400.0)
RBC: 5.08 Mil/uL (ref 4.22–5.81)
RDW: 18.7 % — ABNORMAL HIGH (ref 11.5–15.5)
WBC: 6.7 10*3/uL (ref 4.0–10.5)

## 2016-05-26 LAB — PROTIME-INR
INR: 1.2 ratio — AB (ref 0.8–1.0)
PROTHROMBIN TIME: 12.8 s (ref 9.6–13.1)

## 2016-05-26 NOTE — Progress Notes (Signed)
Subjective:    Patient ID: Ruben Mccormick, male    DOB: May 05, 1956, 60 y.o.   MRN: [id number]  HPI Ruben Mccormick is a 60 year old white male, referred to Dr. Ardis Hughs per Dr.Feng for consideration of colonoscopy and EGD as part of a metastatic workup. Patient had presented with complaints of anorexia and a weight loss of about 20 pounds over the past 6 months.  To me he denies anorexia but says that he has early satiety and cannot eat nearly as much as he used to. He has not had any nausea or vomiting, denies any dysphagia or odynophagia. He is also had diarrhea over the past 2 years initially very mild and gradually progressive. He was placed on the [name] by his PCP Dr. Chapman Fitch which she says does help. If he takes this he may have one to 2 bowel movements per day, if not has a bowel movement every 1-2 hours. He also complains of significant fatigue. Patient has been a longtime drinker usually 2 shots per day. Recent imaging with CT of the abdomen and pelvis 05/01/2016 showed marked reflux of contrast into the IVC and hepatic vein suggesting right heart dysfunction, hypervascular centrally necrotic 7 x 7.6 cm mass in the inferior right liver with peripheral washout, there is also a 10 mm hypervascular lesion in the lateral segment of left liver and a 3.5 cm lesion adjacent to the IVC peripheral nodular enhancement after IV contrast, pancreas unremarkable,, no small bowel wall dilation but there appears to be a short segment of circumferential wall thickening in the terminal ileum and potentially in the hepatic flexure., Small amount of ascites, and a 16 mm spiculated nodule in the right lower lobe  Subsequent PET scan 05/15/2016 shows intense metabolic activity within the right lower lobe pulmonary nodule concerning for bronchogenic carcinoma and no metabolic activity associated with the enhancing hepatic lesions of the left and right hepatic lobes wavering benign etiology possible hemangiomas MRI of the abdomen  05/24/2016 showed multiple enhancing liver lesions the largest of which is in the inferior right liver showing central necrosis MRI features are not consistent with cavernous hemangiomas or cysts and tissue sampling may be indicated  Patient is also been referred to pulmonology and saw Dr. all the last week.Ruben Mccormick He was scheduled for pulmonary function tests, and CT of the chest/super D as well as echo to evaluate right heart dysfunction. Lung biopsy via bronchoscopy was discussed but not scheduled.   Review of Systems Pertinent positive and negative review of systems were noted in the above HPI section.  All other review of systems was otherwise negative.  Outpatient Encounter Prescriptions as of 05/26/2016  Medication Sig  . aspirin 325 MG tablet Take 325 mg by mouth daily.  . B-D 3CC LUER-LOK SYR 21GX1" 21G X 1" 3 ML MISC USE AS DIRECTED FOR TESTOSTERONE  . Cholecalciferol (VITAMIN D-3) 5000 UNITS TABS Take 5,000 Units by mouth daily.   . Eluxadoline (VIBERZI) 100 MG TABS Take 1 tablet by mouth 2 (two) times daily.  . fluticasone (FLONASE) 50 MCG/ACT nasal spray INSTILL 2 SPRAYS INTO THE NOSTRILS QD AT NIGHT  . hydrochlorothiazide (HYDRODIURIL) 25 MG tablet Take 1 tablet (25 mg total) by mouth daily.  Ruben Mccormick PREVIDENT 5000 BOOSTER PLUS 1.1 % PSTE   . testosterone cypionate (DEPOTESTOSTERONE CYPIONATE) 200 MG/ML injection INJECT 2 ML INTO THE MUSCLE EVERY 2 WEEKS  . Triamcinolone Acetonide (TRIAMCINOLONE 0.1 % CREAM : EUCERIN) CREA Apply 1 application topically 2 (two) times daily.  Ruben Mccormick  valACYclovir (VALTREX) 500 MG tablet TAKE 1 TABLET BY MOUTH TWICE DAILY AS NEEDED FOR FEVER BLISTER   No facility-administered encounter medications on file as of 05/26/2016.    Allergies  Allergen Reactions  . Bee Venom Anaphylaxis  . 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 Voltaren [Diclofenac Sodium] Rash   Patient Active Problem List   Diagnosis Date Noted  . Solitary pulmonary nodule 05/20/2016  .  Liver masses 05/15/2016  . Labile hypertension 09/21/2013  . Vitamin D deficiency 09/21/2013  . Medication management 09/21/2013  . Prediabetes 09/21/2013  . Testosterone deficiency 09/21/2013  . Mixed hyperlipidemia 08/31/2007  . ALCOHOL USE 08/31/2007  . SMOKER 08/31/2007  . 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 PROSTATITIS, HX OF 08/31/2007  . PSORIASIS, HX OF 08/31/2007  . GENITAL HERPES, HX OF 08/31/2007  . ADENOMATOUS COLONIC POLYP 08/02/2007   Social History   Social History  . Marital status: Married    Spouse name: N/A  . Number of children: N/A  . Years of education: N/A   Occupational History  . Not on file.   Social History Main Topics  . Smoking status: Former Smoker    Packs/day: 1.00    Years: 40.00    Types: Cigarettes    Quit date: 09/11/2015  . Smokeless tobacco: Never Used  . Alcohol use 16.8 oz/week    14 Standard drinks or equivalent, 14 Shots of liquor per week     Comment: 2 drinks daily since young   . Drug use: No     Comment: cocaine in the past, no iv drugs   . Sexual activity: Not on file   Other Topics Concern  . Not on file   Social History Narrative  . No narrative on file    Mr. Geoffroy's family history includes Alzheimer's disease in his mother; Cancer in his father and paternal grandfather; Diabetes in his father; Parkinson's disease in his mother.      Objective:    Vitals:   05/26/16 1429  BP: 126/76  Pulse: 88    Physical Exam  well-developed older white male in no acute distress, blood pressure 126/76 pulse 88, BMI 23.3, weight 149, O2 sat 94. HEENT; Patient has a very ruddy/bluish coloration to his face neck upper chest and head, with rhinophyma changes of the nose. Positive JVD to the jaw line, Cardiovascular; regular rate and rhythm with S1-S2 soft murmur, Pulmonary ;decreased breath sounds bilaterally, Abdomen ;soft, bowel sounds are present, no definite fluid wave, liver is enlarged and palpable at least 5 fingerbreadths below  the right costal margin somewhat nodular, prominent aortic pulsation, Rectal; exam not done, Extremities ;no clubbing cyanosis or edema skin warm and dry, Neuropsych ;mood and affect appropriate       Assessment & Plan:   #32 60 year old white male with history of chronic EtOH use with weight loss of 20 pounds over the past 6 months, early satiety, fatigue and more chronic symptoms of diarrhea 2 years. Multiple recent imaging studies as outlined above with finding of a 16 mm spiculated nodule in the right lower lobe concerning for a bronchogenic carcinoma. Workup in progress per Dr. Elsworth Soho with pulmonary function tests, 2-D echo, and CT of the chest/super D format. Bronch/lung biopsy discussed but not scheduled #2 multiple liver lesions, largest  a  7.6 centimeter mass in the right liver with central necrosis, no metabolic activity on PET scan but MRI not consistent with benign hemangiomas, cannot rule out malignancy  #3 possible underlying cirrhosis #4  ascites #5 concern for right heart dysfunction on CT of the abdomen and pelvis with marked reflux of contrast into the IVC and hepatic veins On physical exam patient has a bluish very deep ruddy discoloration from his chest up and JVD on exam.  Plan; Will need to follow up on pulmonary function tests, 2-D echo and CT of the chest Discussed with Dr. Ardis Hughs who also met the patient and will proceed with scheduling of EGD and colonoscopy to be done at the hospital per Dr. Ardis Hughs in 2-3 weeks. Procedures were both discussed in detail with the patient and he is agreeable to proceed. Concerned about underlying significant right heart dysfunction which may make sedation high risk. We'll discuss further with Dr. Ardis Hughs, regarding recommendation that he have a lung biopsy, and biopsy of the larger liver lesion, possibly prior to EGD and colonoscopy. We'll check CBC, CMET, INR today. He has not had tumor markers done.   Koralynn Greenspan S Bianney Rockwood  PA-C 05/26/2016   Cc: Unk Pinto, MD

## 2016-05-26 NOTE — Patient Instructions (Signed)
Please go to the basement level to have your labs drawn.  You have been scheduled for an endoscopy and colonoscopy. Please follow the written instructions given to you at your visit today. Please pick up your prep supplies at the pharmacy within the next 1-3 days.  If you use inhalers (even only as needed), please bring them with you on the day of your procedure. Your physician has requested that you go to www.startemmi.com and enter the access code given to you at your visit today. This web site gives a general overview about your procedure. However, you should still follow specific instructions given to you by our office regarding your preparation for the procedure.

## 2016-05-27 NOTE — Progress Notes (Signed)
I agree with the above note, plan.    We're putting him on for colonoscopy/egd for about 2 weeks from now.  If his clinical scenario is not more clear by the previously planned tests at that time (chest ct, echo, bronch, liver biopsy) then we'll proceed.

## 2016-05-29 ENCOUNTER — Ambulatory Visit (HOSPITAL_BASED_OUTPATIENT_CLINIC_OR_DEPARTMENT_OTHER): Payer: Commercial Managed Care - PPO | Admitting: Hematology

## 2016-05-29 ENCOUNTER — Other Ambulatory Visit (HOSPITAL_BASED_OUTPATIENT_CLINIC_OR_DEPARTMENT_OTHER): Payer: Commercial Managed Care - PPO

## 2016-05-29 VITALS — BP 147/95 | HR 79 | Temp 97.7°F | Resp 18 | Ht 67.0 in | Wt 149.1 lb

## 2016-05-29 DIAGNOSIS — J984 Other disorders of lung: Secondary | ICD-10-CM | POA: Diagnosis not present

## 2016-05-29 DIAGNOSIS — I10 Essential (primary) hypertension: Secondary | ICD-10-CM

## 2016-05-29 DIAGNOSIS — R160 Hepatomegaly, not elsewhere classified: Secondary | ICD-10-CM

## 2016-05-29 DIAGNOSIS — K769 Liver disease, unspecified: Secondary | ICD-10-CM

## 2016-05-29 DIAGNOSIS — R634 Abnormal weight loss: Secondary | ICD-10-CM

## 2016-05-29 DIAGNOSIS — K529 Noninfective gastroenteritis and colitis, unspecified: Secondary | ICD-10-CM | POA: Diagnosis not present

## 2016-05-29 DIAGNOSIS — B181 Chronic viral hepatitis B without delta-agent: Secondary | ICD-10-CM

## 2016-05-29 NOTE — Progress Notes (Signed)
[facility]  [phone] [phone]  Clinic Follow Up Note   Patient Care Team: Unk Pinto, MD as PCP - General (Internal Medicine) 05/29/2016  Referral physician: Alesia Richards, MD  CHIEF COMPLAINTS:  Follow up lung and liver lesions   HISTORY OF PRESENTING ILLNESS (05/15/2016):     Ruben Mccormick 60 y.o. male with past medical history of hepatitis B, hypertension, heavy smoking, is here because of his recent abnormal CT scan, which showed multiple liver lesions concerning for malignancy. He was referred by his primary care physician Dr. Melford Aase.    He has had diarrhea for 1.5 years, it has been getting worse lately, up 20 times a day. He was seen by Dr. Melford Aase and started taking Viberzi in 02/2016 which helps a lot. He underwent abdominal ultrasound, which showed a large lesion in the right lobe of the liver. He subsequently underwent abdominal and pelvis CT with contrast, which showed a dominant 7.6 cm necrotic mass in the inferior right lobe, and multiple other small lesions in the liver.   His last colonoscopy was 5 years ago which was normal per patient. He denies significant abdominal pain or discomfort, no melano or hemachizia, but he has mild heart burn and gassy feeling from [employer]. No other symptoms. He has chronic mild dyspnia from smoking. He lost about 25 lbs in the past 4-5 months. 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   He lives with his wife, has two daughters and one son. His previous hepatitis B test showed positive S antigen and E antibody, hepatitis C negative. Patient is on not aware of his prior hepatitis B infection. He did use cocaine, but no IV drugs. No history of blood transfusion.  CURRENT THERAPY: pending   INTERIM HISTORY  Ruben Mccormick returns for follow-up. He is doing well overall, denies any pain or other new complaints. He has good  appetite and energy level, continues working full time. He has seen Dr. Elsworth Soho and GI since his last visit with me.  MEDICAL HISTORY:  Past Medical History:  Diagnosis Date  . Hyperlipidemia   . Hypertension   . Hypogonadism male   . Pre-diabetes   . Vitamin D deficiency     SURGICAL HISTORY: Past Surgical History:  Procedure Laterality Date  . APPENDECTOMY      SOCIAL HISTORY: Social History   Social History  . Marital status: Married    Spouse name: N/A  . Number of children: N/A  . Years of education: N/A   Occupational History  . Not on file.   Social History Main Topics  . Smoking status: Former Smoker    Packs/day: 1.00    Years: 40.00    Types: Cigarettes    Quit date: 09/11/2015  . Smokeless tobacco: Never Used  . Alcohol use 16.8 oz/week    14 Standard drinks or equivalent, 14 Shots of liquor per week     Comment: 2 drinks daily since young   . Drug use: No     Comment: cocaine in the past, no iv drugs   . Sexual activity: Not on file   Other Topics Concern  . Not on file   Social History Narrative  . No narrative on file    FAMILY HISTORY: Family History  Problem Relation Age of Onset  . Diabetes Father   . Cancer Father     mesothelioma   .  Alzheimer's disease Mother   . Parkinson's disease Mother   . Cancer Paternal Grandfather     colon    ALLERGIES:  is allergic to bee venom; ppd [tuberculin purified protein derivative]; and voltaren [diclofenac sodium].  MEDICATIONS:  Current Outpatient Prescriptions  Medication Sig Dispense Refill  . aspirin 325 MG tablet Take 325 mg by mouth daily.    . B-D 3CC LUER-LOK SYR 21GX1" 21G X 1" 3 ML MISC USE AS DIRECTED FOR TESTOSTERONE 10 each 0  . Cholecalciferol (VITAMIN D-3) 5000 UNITS TABS Take 5,000 Units by mouth daily.     . Eluxadoline (VIBERZI) 100 MG TABS Take 1 tablet by mouth 2 (two) times daily. 180 tablet 0  . fluticasone (FLONASE) 50 MCG/ACT nasal spray INSTILL 2 SPRAYS INTO THE NOSTRILS  QD AT NIGHT  5  . hydrochlorothiazide (HYDRODIURIL) 25 MG tablet Take 1 tablet (25 mg total) by mouth daily. 90 tablet 0  . PREVIDENT 5000 BOOSTER PLUS 1.1 % PSTE   3  . testosterone cypionate (DEPOTESTOSTERONE CYPIONATE) 200 MG/ML injection INJECT 2 ML INTO THE MUSCLE EVERY 2 WEEKS 10 mL 5  . Triamcinolone Acetonide (TRIAMCINOLONE 0.1 % CREAM : EUCERIN) CREA Apply 1 application topically 2 (two) times daily. 1 each 11  . valACYclovir (VALTREX) 500 MG tablet TAKE 1 TABLET BY MOUTH TWICE DAILY AS NEEDED FOR FEVER BLISTER 90 tablet 1   No current facility-administered medications for this visit.     REVIEW OF SYSTEMS:   Constitutional: Denies fevers, chills or abnormal night sweats,  (+) weight loss  Eyes: Denies blurriness of vision, double vision or watery eyes Ears, nose, mouth, throat, and face: Denies mucositis or sore throat Respiratory: Denies cough, dyspnea or wheezes Cardiovascular: Denies palpitation, chest discomfort or lower extremity swelling Gastrointestinal:  Denies nausea, heartburn or change in bowel habits Skin: Denies abnormal skin rashes Lymphatics: Denies new lymphadenopathy or easy bruising Neurological:Denies numbness, tingling or new weaknesses Behavioral/Psych: Mood is stable, no new changes  All other systems were reviewed with the patient and are negative.  PHYSICAL EXAMINATION: ECOG PERFORMANCE STATUS: 1 - Symptomatic but completely ambulatory  Vitals:   05/29/16 1413  BP: (!) 147/95  Pulse: 79  Resp: 18  Temp: 97.7 F (36.5 C)   Filed Weights   05/29/16 1413  Weight: 149 lb 1.6 oz (67.6 kg)    GENERAL:alert, no distress and comfortable SKIN: skin color, texture, turgor are normal, no rashes or significant lesions EYES: normal, conjunctiva are pink and non-injected, sclera clear OROPHARYNX:no exudate, no erythema and lips, buccal mucosa, and tongue normal  NECK: supple, thyroid normal size, non-tender, without nodularity LYMPH:  no palpable  lymphadenopathy in the cervical, axillary or inguinal LUNGS: clear to auscultation and percussion with normal breathing effort HEART: regular rate & rhythm and no murmurs and no lower extremity edema ABDOMEN:abdomen soft, non-tender and normal bowel sounds, no organomegaly. Rectal exam was negative for palpable mass, no blood on the tip of gloves   Musculoskeletal:no cyanosis of digits and no clubbing  PSYCH: alert & oriented x 3 with fluent speech NEURO: no focal motor/sensory deficits  LABORATORY DATA:  I have reviewed the data as listed CBC Latest Ref Rng & Units 05/26/2016 03/06/2016 12/05/2015  WBC 4.0 - 10.5 K/uL 6.7 7.4 7.2  Hemoglobin 13.0 - 17.0 g/dL 15.1 14.1 14.7  Hematocrit 39.0 - 52.0 % 46.4 45.5 45.4  Platelets 150.0 - 400.0 K/uL 177.0 337 295   CMP Latest Ref Rng & Units 05/26/2016 05/24/2016 04/10/2016  Glucose 70 - 99 mg/dL 97 - -  BUN 6 - 23 mg/dL 13 - -  Creatinine 0.40 - 1.50 mg/dL 0.89 0.90 -  Sodium 135 - 145 mEq/L 140 - -  Potassium 3.5 - 5.1 mEq/L 4.3 - -  Chloride 96 - 112 mEq/L 101 - -  CO2 19 - 32 mEq/L 33(H) - -  Calcium 8.4 - 10.5 mg/dL 9.3 - -  Total Protein 6.0 - 8.3 g/dL 6.6 - 5.8(L)  Total Bilirubin 0.2 - 1.2 mg/dL 0.6 - 0.8  Alkaline Phos 39 - 117 U/L 259(H) - 185(H)  AST 0 - 37 U/L 29 - 36(H)  ALT 0 - 53 U/L 23 - 25     RADIOGRAPHIC STUDIES: I have personally reviewed the radiological images as listed and agreed with the findings in the report. Ct Abdomen Pelvis W Wo Contrast  Result Date: 05/01/2016 CLINICAL DATA:  Liver lesions seen on ultrasound. EXAM: CT ABDOMEN AND PELVIS WITHOUT AND WITH CONTRAST TECHNIQUE: Multidetector CT imaging of the abdomen and pelvis was performed following the standard protocol before and following the bolus administration of intravenous contrast. CONTRAST:  142m ISOVUE-300 IOPAMIDOL (ISOVUE-300) INJECTION 61% COMPARISON:  Ultrasound exam 04/24/2016 FINDINGS: Lower chest: 16 mm spiculated nodule in the right lower  lobe has been incompletely visualized (image 1 series 9). Hepatobiliary: Arterial phase imaging shows marked reflux of contrast into the IVC and hepatic veins, suggesting right heart dysfunction. Hypervascular centrally necrotic 7.0 x 7.6 cm mass is identified in the inferior right liver (segment V). This lesion shows peripheral washout of enhancement to a greater degree than background liver parenchyma on delayed imaging. 10 mm hypervascular lesion identified lateral segment left liver. 18 mm irregularly enhancing lesion is identified in the subcapsular aspect of the posterior right liver. 3.5 cm lesion adjacent to the IVC shows peripheral nodular enhancement after IV contrast administration. 7 mm enhancing nodule identified towards the dome of the liver. Gallbladder is decompressed. No intrahepatic or extrahepatic biliary dilation. Pancreas: No focal mass lesion. No dilatation of the main duct. No intraparenchymal cyst. No peripancreatic edema. Spleen: No splenomegaly. No focal mass lesion. Adrenals/Urinary Tract: No enhancing lesion in either kidney. Adrenal glands are unremarkable. No evidence for hydroureter. The urinary bladder appears normal for the degree of distention. Stomach/Bowel: Stomach is nondistended. No gastric wall thickening. No evidence of outlet obstruction. Duodenum is normally positioned as is the ligament of Treitz. No small bowel wall thickening. No small bowel dilatation. Right colon is not well demonstrated on this study, but there appears to be a short segment of circumferential wall thickening in the terminal ileum and potentially in the hepatic flexure. Left colon is unremarkable. Vascular/Lymphatic: There is abdominal aortic atherosclerosis without aneurysm. Borderline lymph nodes are noted in the hepatoduodenal ligament. No retroperitoneal lymphadenopathy. No pelvic sidewall lymphadenopathy although borderline enlarged right external iliac lymph node is identified. Reproductive:  Prostate gland is enlarged. Other: Small to moderate volume ascites identified in the abdomen and pelvis. Musculoskeletal: Multiple lucent bone lesions are identified in the anatomic pelvis and lumbar spine. IMPRESSION: 1. Dominant 7.6 cm necrotic mass in the inferior right liver shows irregular peripheral enhancement, consistent with neoplasm. In the setting of cirrhosis or chronic hepatitis-B infection, hepatocellular carcinoma would be a distinct concern. However, liver is not frankly cirrhotic in appearance and the patient is noted to have a 16 mm spiculated right lower lobe nodule (incompletely visualized), an area of possible wall thickening in the right colon, and multiple lucent bone lesions. Given  these findings, metastatic involvement in the liver must also be considered in the patient has several other small hypervascular liver lesions evident. 2. CT chest recommended for more definitive characterization of the incompletely visualized right lower lobe lesion has primary lung cancer or metastatic disease to the lung is a concern. 3. Correlation with colorectal cancer screening history recommended. 4. Small to moderate volume ascites. 5. Reflux of intravenous contrast down the IVC and hepatic veins suggests right heart dysfunction. 6. PET-CT may prove helpful to further evaluate. Electronically Signed   By: Misty Stanley M.D.   On: 05/01/2016 16:48   Mr Abdomen W Wo Contrast  Result Date: 05/24/2016 CLINICAL DATA:  Liver lesions on previous imaging. EXAM: MRI ABDOMEN WITHOUT AND WITH CONTRAST TECHNIQUE: Multiplanar multisequence MR imaging of the abdomen was performed both before and after the administration of intravenous contrast. CONTRAST:  37m MULTIHANCE GADOBENATE DIMEGLUMINE 529 MG/ML IV SOLN COMPARISON:  PET-CT 05/15/2016.  Abdomen and pelvis CT 05/01/2016. FINDINGS: Lower chest: Unremarkable. Hepatobiliary: As noted on previous studies, the patient has numerous liver lesions of varying sizes.  Dominant lesion is inferior right liver measuring 6.5 x 8.6 cm and showing thick irregular enhancing rim with central necrosis. Numerous other scattered lesions range in size from 10 mm up to 3.1 cm. These lesions do not image characteristically for cavernous hemangioma. Liver parenchyma enhances heterogeneously. Gallbladder is nondistended No intrahepatic or extrahepatic biliary dilation. Pancreas: No focal mass lesion. No dilatation of the main duct. No intraparenchymal cyst. No peripancreatic edema. Spleen:  No splenomegaly. No focal mass lesion. Adrenals/Urinary Tract: No adrenal nodule or mass. Kidneys are unremarkable. Stomach/Bowel: Stomach is nondistended. No gastric wall thickening. No evidence of outlet obstruction. Duodenum is normally positioned as is the ligament of Treitz. No small bowel or colonic dilatation within the visualized abdomen. Vascular/Lymphatic: No abdominal aortic aneurysm. No abdominal lymphadenopathy. Other:  Small volume ascites noted. Musculoskeletal: No abnormal marrow signal within the visualized bony anatomy. IMPRESSION: Multiple enhancing liver lesions, the largest of which in the inferior right liver shows central necrosis. These lesions did not appear hypermetabolic on PET-CT, but MR imaging features are not consistent with cavernous hemangioma or cysts. Tissue sampling may be indicated to exclude malignancy. Electronically Signed   By: EMisty StanleyM.D.   On: 05/24/2016 14:10   Nm Pet Image Initial (pi) Skull Base To Thigh  Result Date: 05/15/2016 CLINICAL DATA:  Initial treatment strategy for liver mass. NUCLEAR MEDICINE PET SKULL BASE TO THIGH TECHNIQUE: 7.4 mCi F-18 FDG was injected intravenously. Full-ring PET imaging was performed from the skull base to thigh after the radiotracer. CT data was obtained and used for attenuation correction and anatomic localization. FASTING BLOOD GLUCOSE:  Value: 108 mg/dl COMPARISON:  CT 05/01/2016, ultrasound 9157 FINDINGS: NECK No  hypermetabolic lymph nodes in the neck CHEST Within the RIGHT lower lobe rounded nodule centrally measuring 15 mm (image 51, series 8) has intense metabolic activity SUV max equal 9.1. 11 mm ground-glass nodule RIGHT upper lobe (image 20 series 8). No hypermetabolic mediastinal lymph nodes. ABDOMEN/PELVIS Multiple enhancing lesion described on comparison exam did not have associated metabolic activity above background liver activity. In particular the dominant lesion in the RIGHT hepatic lobe. Adrenal glands are normal. Pancreas spleen and kidneys and no abnormal metabolic activity. No hypermetabolic lymph nodes in the abdomen pelvis. There is intense metabolic activity associated distal ileum which is likely physiologic. Uptake in the gastric body is also likely physiologic. SKELETON No focal hypermetabolic activity to suggest skeletal  metastasis. IMPRESSION: 1. Intense metabolic activity associated with the RIGHT lower lobe pulmonary nodule is concerning for bronchogenic carcinoma. No evidence of mediastinal nodal metastasis or distant metastasis (potential stage IA). 2. Ground-glass nodule RIGHT upper lobe is indeterminate. This lesion will require follow-up. 3. No metabolic activity associated with the enhancing hepatic lesions in LEFT and RIGHT hepatic lobe demonstrated on comparison contrast CT exam. This favors a benign etiology for these lesions which likely represent hemangiomas. Due to high concern for the larger RIGHT hepatic lesion consider percutaneous biopsy if the pulmonary lesion is found to be malignant. Recommend correlation with appropriate tumor markers additionally. 4. Favored Physiologic activity within the distal ileum and stomach. Electronically Signed   By: Suzy Bouchard M.D.   On: 05/15/2016 16:18    ASSESSMENT & PLAN:  60 year old male with past medical history of hypertension, hepatitis B infection, presented with worsening diarrhea for one and half years, fatigue, anorexia and  weight loss.  1. Liver and lung lesions, suspicion for malignancy -I reviewed his recent CT abdomen and pelvis scan images as patient in person  -We discussed that his CT scan is highly suspicious for primary liver malignancy or metastasis. He did have hepatitis B infection, but has been having normal liver functions, likely hepatitis B carrier. No imaging evidence of liver cirrhosis, HCC is a possibility but less likely. The other differential including cholangiocarcinoma, metastatic carcinoma from lung, stomach or colon. -He has a heavy smoking history, and a CT scan showed a 1.6 cm mass in the right lower lobe of lung, highly suspicious for malignancy. -His PET scan showed hypermetabolic right lower lobe lung nodule, highly suspicious for primary lung cancer. No significant hilar or mediastinal lymphadenopathy. This additional small groundglass nodule in the right upper lobe, indeterminate. I reviewed the imaging findings with patient -However his multiple liver lesions are not hypermetabolic on the PET scan, could be a secondary primary, versus benign lesions. This was further evaluated by liver MRI, which showed multiple enhancing liver lesions, suspicious for malignancy. -I have reviewed his image findings with interventional radiologist Dr. Kathlene Cote, he will commence liver biopsy. I discussed with patient, he is agreeable. -He may still need bronchoscopy and biopsy of the lung lesion, unless the liver biopsy supports metastatic lung cancer. He has seen pulmonologist Dr. Elsworth Soho   2. Chronic diarrhea, low appetite and weight loss -He was seen by about GI, scheduled to have colonoscopy in 2-3 weeks  2. HTN, hepatitis B carrier -He will continue follow-up with his primary care physician   Plan -Liver mass biopsy by interventional radiologist, I encouraged patient to get it done as soon as possible -I'll see him back after his liver biopsy   All questions were answered. The patient knows to  call the clinic with any problems, questions or concerns. I spent 25 minutes counseling the patient face to face. The total time spent in the appointment was 30 minutes and more than 50% was on counseling.     Truitt Merle, MD 05/29/2016

## 2016-05-30 ENCOUNTER — Encounter: Payer: Self-pay | Admitting: Hematology

## 2016-06-01 ENCOUNTER — Telehealth: Payer: Self-pay | Admitting: Pulmonary Disease

## 2016-06-01 NOTE — Telephone Encounter (Signed)
Please reschedule his appointment from 10/25 for 2 weeks from now. He will undergo liver biopsy and colonoscopy prior to follow-up with me

## 2016-06-01 NOTE — Addendum Note (Signed)
Addended by: Truitt Merle on: 06/01/2016 10:19 AM   Modules accepted: Orders

## 2016-06-03 ENCOUNTER — Ambulatory Visit (INDEPENDENT_AMBULATORY_CARE_PROVIDER_SITE_OTHER): Payer: Commercial Managed Care - PPO | Admitting: Pulmonary Disease

## 2016-06-03 ENCOUNTER — Encounter: Payer: Self-pay | Admitting: Pulmonary Disease

## 2016-06-03 DIAGNOSIS — J449 Chronic obstructive pulmonary disease, unspecified: Secondary | ICD-10-CM | POA: Diagnosis not present

## 2016-06-03 DIAGNOSIS — R911 Solitary pulmonary nodule: Secondary | ICD-10-CM | POA: Diagnosis not present

## 2016-06-03 MED ORDER — TIOTROPIUM BROMIDE-OLODATEROL 2.5-2.5 MCG/ACT IN AERS: 2.0000 | INHALATION_SPRAY | Freq: Every day | RESPIRATORY_TRACT | 0 refills | Status: AC

## 2016-06-03 NOTE — Assessment & Plan Note (Signed)
Trial of STIOLTO once daily- call us for prescription if this helps her breathing If this does not work then we'll try breo-he does have some reversibility on PFTs Lung function is at 42%

## 2016-06-03 NOTE — Patient Instructions (Signed)
Trial of STIOLTO once daily- call us for prescription if this helps her breathing Lung function is at 42% We will await results of colonoscopy and liver biopsy before deciding about the lung biopsy

## 2016-06-03 NOTE — Progress Notes (Signed)
   Subjective:    Patient ID: Ruben Mccormick, male    DOB: 08-21-1955, 60 y.o.   MRN: [id number]  HPI  60 year old smoker for FU of lung nodule noted incidentally on abdominal imaging.  He smokes about a pack per day for 40 years-about 40 pack years until he quit in February 2017. He also drinks 2-3 mixed drinks daily. He carries a diagnosis of hepatitis B and has not been treated for this. He reports chronic diarrhea for which she underwent an ultrasound abdomen by his PCP-this showed liver lesions.   06/03/2016 Chief Complaint  Patient presents with  . Follow-up    2wk rov. breathingis baseline. pt c/o sob with exertion. denies any cough, wheezing or cp/tightness.   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 16 mm spiculated lesion was noted in the right lower lobe. There was some reflux of contrast down the IVC suggesting right heart dysfunction  PET scan 05/2016 showed hypermetabolism in the dominant liver nodule and in the right lower lobe lesion. Smaller nodules in the liver did not light up  PFTs 05/2016 show a ratio of 42, FEV1 of 30% that improved by 40% to 42% post Bronchodilator, FVC of 54% and DLCO of 54%  Review of Systems neg for any significant sore throat, dysphagia, itching, sneezing, nasal congestion or excess/ purulent secretions, fever, chills, sweats, unintended wt loss, pleuritic or exertional cp, hempoptysis, orthopnea pnd or change in chronic leg swelling. Also denies presyncope, palpitations, heartburn, abdominal pain, nausea, vomiting, diarrhea or change in bowel or urinary habits, dysuria,hematuria, rash, arthralgias, visual complaints, headache, numbness  weakness or ataxia.     Objective:   Physical Exam  Gen. Pleasant, well-nourished, in no distress ENT - no lesions, no post nasal , ruddy nose Neck: No JVD, no thyromegaly, no carotid bruits Lungs: no use of accessory muscles, no dullness to percussion, clear without rales or rhonchi  Cardiovascular: Rhythm regular, heart sounds  normal, no murmurs or gallops, no peripheral edema Musculoskeletal: No deformities, no cyanosis or clubbing        Assessment & Plan:

## 2016-06-03 NOTE — Progress Notes (Signed)
Patient ID: Ruben Mccormick, male   DOB: 07-19-1956, 60 y.o.   MRN: [id number] .Patient seen in the office today and instructed on use of stiolto respimat.  Patient expressed understanding and demonstrated technique.

## 2016-06-03 NOTE — Assessment & Plan Note (Signed)
We will await results of colonoscopy and liver biopsy before deciding about the lung biopsy will need navigation procedure to reach this nodule doubt that he is a candidate for surgery so we'll need fiducial placement at the same time- Super D CT scan has been scheduled

## 2016-06-04 ENCOUNTER — Other Ambulatory Visit: Payer: Self-pay | Admitting: Radiology

## 2016-06-05 ENCOUNTER — Ambulatory Visit (INDEPENDENT_AMBULATORY_CARE_PROVIDER_SITE_OTHER)
Admission: RE | Admit: 2016-06-05 | Discharge: 2016-06-05 | Disposition: A | Discharge status: Home / Self Care | Payer: Commercial Managed Care - PPO | Source: Ambulatory Visit | Attending: Pulmonary Disease | Admitting: Pulmonary Disease

## 2016-06-05 ENCOUNTER — Encounter (HOSPITAL_COMMUNITY): Payer: Self-pay

## 2016-06-05 ENCOUNTER — Ambulatory Visit (HOSPITAL_COMMUNITY): Payer: Commercial Managed Care - PPO | Attending: Cardiovascular Disease

## 2016-06-05 ENCOUNTER — Encounter (HOSPITAL_COMMUNITY): Payer: Self-pay | Admitting: *Deleted

## 2016-06-05 DIAGNOSIS — Z87891 Personal history of nicotine dependence: Secondary | ICD-10-CM | POA: Insufficient documentation

## 2016-06-05 DIAGNOSIS — I10 Essential (primary) hypertension: Secondary | ICD-10-CM | POA: Insufficient documentation

## 2016-06-05 DIAGNOSIS — R7303 Prediabetes: Secondary | ICD-10-CM | POA: Diagnosis not present

## 2016-06-05 DIAGNOSIS — E785 Hyperlipidemia, unspecified: Secondary | ICD-10-CM | POA: Diagnosis not present

## 2016-06-05 DIAGNOSIS — R0609 Other forms of dyspnea: Secondary | ICD-10-CM | POA: Diagnosis present

## 2016-06-05 DIAGNOSIS — R911 Solitary pulmonary nodule: Secondary | ICD-10-CM | POA: Diagnosis not present

## 2016-06-05 DIAGNOSIS — R188 Other ascites: Secondary | ICD-10-CM | POA: Insufficient documentation

## 2016-06-05 DIAGNOSIS — I071 Rheumatic tricuspid insufficiency: Secondary | ICD-10-CM | POA: Diagnosis not present

## 2016-06-05 NOTE — Progress Notes (Signed)
Ruben Mccormick presented for echocardiogram this morning. I have noticed a dilated right heart with wide open tricuspid regurgitation with PA ~25 mmHg. Dr. Radford Pax (DOD) was notified and she personally looked at the exam and said he is safe to be discharged home.  Wyatt Mage, [place] 06/05/2016

## 2016-06-08 ENCOUNTER — Ambulatory Visit (HOSPITAL_COMMUNITY)
Admission: RE | Admit: 2016-06-08 | Discharge: 2016-06-08 | Disposition: A | Discharge status: Home / Self Care | Payer: Commercial Managed Care - PPO | Source: Ambulatory Visit | Attending: Hematology | Admitting: Hematology

## 2016-06-08 ENCOUNTER — Encounter (HOSPITAL_COMMUNITY): Payer: Self-pay

## 2016-06-08 DIAGNOSIS — E559 Vitamin D deficiency, unspecified: Secondary | ICD-10-CM | POA: Insufficient documentation

## 2016-06-08 DIAGNOSIS — R7303 Prediabetes: Secondary | ICD-10-CM | POA: Diagnosis not present

## 2016-06-08 DIAGNOSIS — R188 Other ascites: Secondary | ICD-10-CM | POA: Insufficient documentation

## 2016-06-08 DIAGNOSIS — Z7982 Long term (current) use of aspirin: Secondary | ICD-10-CM | POA: Insufficient documentation

## 2016-06-08 DIAGNOSIS — R109 Unspecified abdominal pain: Secondary | ICD-10-CM | POA: Diagnosis not present

## 2016-06-08 DIAGNOSIS — R7989 Other specified abnormal findings of blood chemistry: Secondary | ICD-10-CM | POA: Diagnosis not present

## 2016-06-08 DIAGNOSIS — Z79899 Other long term (current) drug therapy: Secondary | ICD-10-CM | POA: Diagnosis not present

## 2016-06-08 DIAGNOSIS — Z888 Allergy status to other drugs, medicaments and biological substances status: Secondary | ICD-10-CM | POA: Diagnosis not present

## 2016-06-08 DIAGNOSIS — Z87891 Personal history of nicotine dependence: Secondary | ICD-10-CM | POA: Insufficient documentation

## 2016-06-08 DIAGNOSIS — C7B8 Other secondary neuroendocrine tumors: Secondary | ICD-10-CM | POA: Insufficient documentation

## 2016-06-08 DIAGNOSIS — C7A8 Other malignant neuroendocrine tumors: Secondary | ICD-10-CM | POA: Diagnosis not present

## 2016-06-08 DIAGNOSIS — R911 Solitary pulmonary nodule: Secondary | ICD-10-CM | POA: Insufficient documentation

## 2016-06-08 DIAGNOSIS — R634 Abnormal weight loss: Secondary | ICD-10-CM | POA: Diagnosis not present

## 2016-06-08 DIAGNOSIS — Z833 Family history of diabetes mellitus: Secondary | ICD-10-CM | POA: Insufficient documentation

## 2016-06-08 DIAGNOSIS — K769 Liver disease, unspecified: Secondary | ICD-10-CM | POA: Diagnosis present

## 2016-06-08 DIAGNOSIS — E785 Hyperlipidemia, unspecified: Secondary | ICD-10-CM | POA: Diagnosis not present

## 2016-06-08 DIAGNOSIS — E291 Testicular hypofunction: Secondary | ICD-10-CM | POA: Insufficient documentation

## 2016-06-08 DIAGNOSIS — R197 Diarrhea, unspecified: Secondary | ICD-10-CM | POA: Diagnosis not present

## 2016-06-08 DIAGNOSIS — I10 Essential (primary) hypertension: Secondary | ICD-10-CM | POA: Insufficient documentation

## 2016-06-08 DIAGNOSIS — Z7951 Long term (current) use of inhaled steroids: Secondary | ICD-10-CM | POA: Insufficient documentation

## 2016-06-08 DIAGNOSIS — R160 Hepatomegaly, not elsewhere classified: Secondary | ICD-10-CM

## 2016-06-08 LAB — COMPREHENSIVE METABOLIC PANEL
ALK PHOS: 222 U/L — AB (ref 38–126)
ALT: 32 U/L (ref 17–63)
AST: 47 U/L — AB (ref 15–41)
Albumin: 3.7 g/dL (ref 3.5–5.0)
Anion gap: 9 (ref 5–15)
BILIRUBIN TOTAL: 0.8 mg/dL (ref 0.3–1.2)
BUN: 9 mg/dL (ref 6–20)
CALCIUM: 9.3 mg/dL (ref 8.9–10.3)
CO2: 30 mmol/L (ref 22–32)
CREATININE: 1.02 mg/dL (ref 0.61–1.24)
Chloride: 101 mmol/L (ref 101–111)
Glucose, Bld: 88 mg/dL (ref 65–99)
Potassium: 4.3 mmol/L (ref 3.5–5.1)
Sodium: 140 mmol/L (ref 135–145)
Total Protein: 6.5 g/dL (ref 6.5–8.1)

## 2016-06-08 LAB — CBC WITH DIFFERENTIAL/PLATELET
BASOS ABS: 0.1 10*3/uL (ref 0.0–0.1)
Basophils Relative: 1 %
EOS PCT: 6 %
Eosinophils Absolute: 0.4 10*3/uL (ref 0.0–0.7)
HEMATOCRIT: 49.5 % (ref 39.0–52.0)
HEMOGLOBIN: 15.8 g/dL (ref 13.0–17.0)
LYMPHS ABS: 1.3 10*3/uL (ref 0.7–4.0)
LYMPHS PCT: 22 %
MCH: 29.8 pg (ref 26.0–34.0)
MCHC: 31.9 g/dL (ref 30.0–36.0)
MCV: 93.2 fL (ref 78.0–100.0)
Monocytes Absolute: 0.6 10*3/uL (ref 0.1–1.0)
Monocytes Relative: 10 %
NEUTROS ABS: 3.5 10*3/uL (ref 1.7–7.7)
Neutrophils Relative %: 61 %
PLATELETS: 188 10*3/uL (ref 150–400)
RBC: 5.31 MIL/uL (ref 4.22–5.81)
RDW: 17.5 % — ABNORMAL HIGH (ref 11.5–15.5)
WBC: 5.8 10*3/uL (ref 4.0–10.5)

## 2016-06-08 LAB — PROTIME-INR
INR: 1.08
PROTHROMBIN TIME: 14 s (ref 11.4–15.2)

## 2016-06-08 MED ORDER — FENTANYL CITRATE (PF) 100 MCG/2ML IJ SOLN
INTRAMUSCULAR | Status: AC
  Filled 2016-06-08: qty 2

## 2016-06-08 MED ORDER — FENTANYL CITRATE (PF) 100 MCG/2ML IJ SOLN
INTRAMUSCULAR | Status: AC | PRN
  Administered 2016-06-08 (×2): 50 ug via INTRAVENOUS

## 2016-06-08 MED ORDER — SODIUM CHLORIDE 0.9 % IV SOLN
INTRAVENOUS | Status: DC
  Administered 2016-06-08: 12:00:00 via INTRAVENOUS

## 2016-06-08 MED ORDER — MIDAZOLAM HCL 2 MG/2ML IJ SOLN
INTRAMUSCULAR | Status: AC
  Filled 2016-06-08: qty 2

## 2016-06-08 MED ORDER — GELATIN ABSORBABLE 12-7 MM EX MISC
CUTANEOUS | Status: AC
  Filled 2016-06-08: qty 1

## 2016-06-08 MED ORDER — MIDAZOLAM HCL 2 MG/2ML IJ SOLN
INTRAMUSCULAR | Status: AC | PRN
  Administered 2016-06-08 (×2): 1 mg via INTRAVENOUS

## 2016-06-08 MED ORDER — LIDOCAINE HCL 1 % IJ SOLN
INTRAMUSCULAR | Status: DC
Start: 2016-06-08 — End: 2016-06-09
  Filled 2016-06-08: qty 20

## 2016-06-08 NOTE — Sedation Documentation (Signed)
Patient is resting comfortably. 

## 2016-06-08 NOTE — Discharge Instructions (Signed)

## 2016-06-08 NOTE — H&P (Signed)
Chief Complaint: Patient was seen in consultation today for liver lesion biopsy and paracentesis at the request of Feng,Yan  Referring Physician(s): Feng,Yan  Supervising Physician: Markus Daft  Patient Status: [facility] - Out-pt  History of Present Illness: Ruben Mccormick is a 60 y.o. male   PMH: Hep B; HTN and alcohol use Wt loss +Smoker (quit Feb 2017) Worsening diarrhea and known elevated liver tests Work up included Abd [place]: 04/24/16: IMPRESSION: Numerous homogeneous and heterogeneous hyperechoic liver masses with the largest measuring 8.9 cm over the right lobe. Associated ascites. This may be due to multiple benign hemangiomas/adenomas, although cannot exclude metastatic disease. Recommend CT with contrast for further evaluation. Contracted gallbladder with mild nonspecific wall thickening likely due to the associated ascites   MRI 05/24/16: IMPRESSION: Multiple enhancing liver lesions, the largest of which in the inferior right liver shows central necrosis. These lesions did not appear hypermetabolic on PET-CT, but MR imaging features are not consistent with cavernous hemangioma or cysts. Tissue sampling may be indicated to exclude malignancy.  Also noted pulmonary nodules Referred to Dr Elsworth Soho Plan for evaluation and bronchoscopy soon---awaiting all results regarding liver lesions first     Past Medical History:  Diagnosis Date  . Hyperlipidemia   . Hypertension   . Hypogonadism male   . Pre-diabetes   . Vitamin D deficiency     Past Surgical History:  Procedure Laterality Date  . APPENDECTOMY      Allergies: Bee venom; Ppd [tuberculin purified protein derivative]; and Voltaren [diclofenac sodium]  Medications: Prior to Admission medications   Medication Sig Start Date End Date Taking? Authorizing Provider  aspirin 325 MG tablet Take 325 mg by mouth daily.    Historical Provider, MD  B-D 3CC LUER-LOK SYR 21GX1" 21G X 1" 3 ML MISC USE AS DIRECTED FOR  TESTOSTERONE 06/04/14   Vicie Mutters, PA-C  Cholecalciferol (VITAMIN D-3) 5000 UNITS TABS Take 5,000 Units by mouth daily.     Historical Provider, MD  Eluxadoline (VIBERZI) 100 MG TABS Take 1 tablet by mouth 2 (two) times daily. Patient taking differently: Take 1 tablet by mouth daily.  03/06/16   Courtney Forcucci, PA-C  fluticasone (FLONASE) 50 MCG/ACT nasal spray INSTILL 2 SPRAYS INTO THE NOSTRILS QD AT NIGHT 12/19/15   Historical Provider, MD  hydrochlorothiazide (HYDRODIURIL) 25 MG tablet Take 1 tablet (25 mg total) by mouth daily. 03/06/16   Courtney Forcucci, PA-C  PREVIDENT 5000 BOOSTER PLUS 1.1 % PSTE  11/11/15   Historical Provider, MD  testosterone cypionate (DEPOTESTOSTERONE CYPIONATE) 200 MG/ML injection INJECT 2 ML INTO THE MUSCLE EVERY 2 WEEKS 09/08/15   Unk Pinto, MD  Triamcinolone Acetonide (TRIAMCINOLONE 0.1 % CREAM : EUCERIN) CREA Apply 1 application topically 2 (two) times daily. 07/16/14   Unk Pinto, MD  valACYclovir (VALTREX) 500 MG tablet TAKE 1 TABLET BY MOUTH TWICE DAILY AS NEEDED FOR FEVER BLISTER 06/21/15   Unk Pinto, MD     Family History  Problem Relation Age of Onset  . Diabetes Father   . Cancer Father     mesothelioma   . Alzheimer's disease Mother   . Parkinson's disease Mother   . Cancer Paternal Grandfather     colon    Social History   Social History  . Marital status: Married    Spouse name: N/A  . Number of children: N/A  . Years of education: N/A   Social History Main Topics  . Smoking status: Former Smoker    Packs/day: 1.00  Years: 40.00    Types: Cigarettes    Quit date: 09/11/2015  . Smokeless tobacco: Never Used  . Alcohol use 16.8 oz/week    14 Shots of liquor, 14 Standard drinks or equivalent per week     Comment: 2 drinks daily since young   . Drug use: No     Comment: cocaine in the past, no iv drugs   . Sexual activity: Not Asked   Other Topics Concern  . None   Social History Narrative  . None      Review of Systems: A 12 point ROS discussed and pertinent positives are indicated in the HPI above.  All other systems are negative.  Review of Systems  Constitutional: Positive for activity change, appetite change, fatigue and unexpected weight change. Negative for fever.  Respiratory: Negative for cough and shortness of breath.   Cardiovascular: Negative for chest pain.  Gastrointestinal: Positive for abdominal pain, diarrhea and nausea.  Musculoskeletal: Negative for gait problem.  Neurological: Positive for weakness.  Psychiatric/Behavioral: Negative for behavioral problems and confusion.    Vital Signs: There were no vitals taken for this visit.  Physical Exam  Constitutional: He is oriented to person, place, and time.  Eyes: EOM are normal.  Cardiovascular: Normal rate, regular rhythm and normal heart sounds.   Pulmonary/Chest: Effort normal and breath sounds normal.  Abdominal: Soft. Bowel sounds are normal. There is no tenderness.  Musculoskeletal: Normal range of motion.  Neurological: He is alert and oriented to person, place, and time.  Skin: Skin is warm.  Reddish complection  Psychiatric: He has a normal mood and affect. His behavior is normal. Judgment and thought content normal.  Nursing note and vitals reviewed.   Mallampati Score:  MD Evaluation Airway: WNL Heart: WNL Abdomen: WNL Chest/ Lungs: WNL ASA  Classification: 3 Mallampati/Airway Score: Two  Imaging: Mr Abdomen W Wo Contrast  Result Date: 05/24/2016 CLINICAL DATA:  Liver lesions on previous imaging. EXAM: MRI ABDOMEN WITHOUT AND WITH CONTRAST TECHNIQUE: Multiplanar multisequence MR imaging of the abdomen was performed both before and after the administration of intravenous contrast. CONTRAST:  51m MULTIHANCE GADOBENATE DIMEGLUMINE 529 MG/ML IV SOLN COMPARISON:  PET-CT 05/15/2016.  Abdomen and pelvis CT 05/01/2016. FINDINGS: Lower chest: Unremarkable. Hepatobiliary: As noted on previous  studies, the patient has numerous liver lesions of varying sizes. Dominant lesion is inferior right liver measuring 6.5 x 8.6 cm and showing thick irregular enhancing rim with central necrosis. Numerous other scattered lesions range in size from 10 mm up to 3.1 cm. These lesions do not image characteristically for cavernous hemangioma. Liver parenchyma enhances heterogeneously. Gallbladder is nondistended No intrahepatic or extrahepatic biliary dilation. Pancreas: No focal mass lesion. No dilatation of the main duct. No intraparenchymal cyst. No peripancreatic edema. Spleen:  No splenomegaly. No focal mass lesion. Adrenals/Urinary Tract: No adrenal nodule or mass. Kidneys are unremarkable. Stomach/Bowel: Stomach is nondistended. No gastric wall thickening. No evidence of outlet obstruction. Duodenum is normally positioned as is the ligament of Treitz. No small bowel or colonic dilatation within the visualized abdomen. Vascular/Lymphatic: No abdominal aortic aneurysm. No abdominal lymphadenopathy. Other:  Small volume ascites noted. Musculoskeletal: No abnormal marrow signal within the visualized bony anatomy. IMPRESSION: Multiple enhancing liver lesions, the largest of which in the inferior right liver shows central necrosis. These lesions did not appear hypermetabolic on PET-CT, but MR imaging features are not consistent with cavernous hemangioma or cysts. Tissue sampling may be indicated to exclude malignancy. Electronically Signed   By: ERandall Hiss  Tery Sanfilippo M.D.   On: 05/24/2016 14:10   Nm Pet Image Initial (pi) Skull Base To Thigh  Result Date: 05/15/2016 CLINICAL DATA:  Initial treatment strategy for liver mass. NUCLEAR MEDICINE PET SKULL BASE TO THIGH TECHNIQUE: 7.4 mCi F-18 FDG was injected intravenously. Full-ring PET imaging was performed from the skull base to thigh after the radiotracer. CT data was obtained and used for attenuation correction and anatomic localization. FASTING BLOOD GLUCOSE:  Value: 108  mg/dl COMPARISON:  CT 05/01/2016, ultrasound 9157 FINDINGS: NECK No hypermetabolic lymph nodes in the neck CHEST Within the RIGHT lower lobe rounded nodule centrally measuring 15 mm (image 51, series 8) has intense metabolic activity SUV max equal 9.1. 11 mm ground-glass nodule RIGHT upper lobe (image 20 series 8). No hypermetabolic mediastinal lymph nodes. ABDOMEN/PELVIS Multiple enhancing lesion described on comparison exam did not have associated metabolic activity above background liver activity. In particular the dominant lesion in the RIGHT hepatic lobe. Adrenal glands are normal. Pancreas spleen and kidneys and no abnormal metabolic activity. No hypermetabolic lymph nodes in the abdomen pelvis. There is intense metabolic activity associated distal ileum which is likely physiologic. Uptake in the gastric body is also likely physiologic. SKELETON No focal hypermetabolic activity to suggest skeletal metastasis. IMPRESSION: 1. Intense metabolic activity associated with the RIGHT lower lobe pulmonary nodule is concerning for bronchogenic carcinoma. No evidence of mediastinal nodal metastasis or distant metastasis (potential stage IA). 2. Ground-glass nodule RIGHT upper lobe is indeterminate. This lesion will require follow-up. 3. No metabolic activity associated with the enhancing hepatic lesions in LEFT and RIGHT hepatic lobe demonstrated on comparison contrast CT exam. This favors a benign etiology for these lesions which likely represent hemangiomas. Due to high concern for the larger RIGHT hepatic lesion consider percutaneous biopsy if the pulmonary lesion is found to be malignant. Recommend correlation with appropriate tumor markers additionally. 4. Favored Physiologic activity within the distal ileum and stomach. Electronically Signed   By: Suzy Bouchard M.D.   On: 05/15/2016 16:18   Ct Super D Chest Wo Contrast  Result Date: 06/05/2016 CLINICAL DATA:  Right lower lobe pulmonary nodule. Planning for  bronchoscopy. Super D protocol. EXAM: CT CHEST WITHOUT CONTRAST TECHNIQUE: Multidetector CT imaging of the chest was performed using thin slice collimation for electromagnetic bronchoscopy planning purposes, without intravenous contrast. COMPARISON:  PET-CT 05/15/2016.  Abdominal CT 05/01/2016. FINDINGS: Cardiovascular: There is atherosclerosis of the aorta, great vessels and coronary arteries. No acute vascular findings are seen on noncontrast imaging. The heart size is normal. There is no pericardial effusion. Mediastinum/Nodes: There are no enlarged mediastinal, hilar or axillary lymph nodes.Small axillary lymph nodes are present, not hypermetabolic on PET-CT. The thyroid gland, trachea and esophagus demonstrate no significant findings. Lungs/Pleura: There is no pleural effusion. Moderate centrilobular emphysema. The dominant spiculated right lower lobe nodule measures 11 x 11 mm on image 43. This is unchanged from the recent studies as remeasured. There are additional scattered small pulmonary nodules, mostly subpleural in location. There are also ground-glass densities in both upper lobes, stable from recent PET-CT. Upper abdomen: There is contour irregularity of the liver and ascites. The liver lesions seen on recent abdominal CT are not well visualized on this noncontrast study. No evidence of adrenal mass. Musculoskeletal/Chest wall: There is no chest wall mass or suspicious osseous finding. There are degenerative changes within the thoracic spine. There is fatty atrophy of the right supraspinatus and infraspinatus muscles. There is subcutaneous lipoma superiorly in the upper left chest, measuring up to  3.7 cm on image 9. IMPRESSION: 1. Imaging for electro magnetic bronchoscopy planning purposes. The dominant spiculated right lower lobe lesion is unchanged, hypermetabolic on recent PET-CT and remaining worrisome for bronchogenic carcinoma. 2. No other focally suspicious nodules. There is emphysema with  scattered scarring and upper lobe ground-glass densities. Follow-up will depend on the etiology of the dominant right lower lobe lesion, but CT follow-up in at least 6 months suggested. This recommendation follows the consensus statement: Guidelines for Management of Incidental Pulmonary Nodules Detected on CT Images: From the Fleischner Society 2017; Radiology 2017; 284:228-243. 3. No adenopathy. 4. Atherosclerosis. Electronically Signed   By: Richardean Sale M.D.   On: 06/05/2016 10:47    Labs:  CBC:  Recent Labs  12/05/15 1555 03/06/16 1012 05/26/16 1540 06/08/16 1209  WBC 7.2 7.4 6.7 5.8  HGB 14.7 14.1 15.1 15.8  HCT 45.4 45.5 46.4 49.5  PLT 295 337 177.0 188    COAGS:  Recent Labs  05/26/16 1540  INR 1.2*    BMP:  Recent Labs  12/05/15 1555 03/06/16 1012 05/24/16 1036 05/26/16 1540  NA 140 136  --  140  K 4.5 4.5  --  4.3  CL 100 99  --  101  CO2 29 28  --  33*  GLUCOSE 97 169*  --  97  BUN 12 10  --  13  CALCIUM 9.0 8.9  --  9.3  CREATININE 1.03 1.00 0.90 0.89  GFRNONAA 77 80  --   --   GFRAA >89 >89  --   --     LIVER FUNCTION TESTS:  Recent Labs  12/05/15 1555 03/06/16 1012 04/10/16 0949 05/26/16 1540  BILITOT 0.5 0.7 0.8 0.6  AST 37* 29 36* 29  ALT 34 '28 25 23  '$ ALKPHOS 95 227* 185* 259*  PROT 5.8* 6.0* 5.8* 6.6  ALBUMIN 3.8 3.4* 3.6 3.8    TUMOR MARKERS: No results for input(s): AFPTM, CEA, CA199, CHROMGRNA in the last 8760 hours.  Assessment and Plan:  Liver lesions; pulmonary lesion Elevated liver functions; Hep B Worsening diarrhea Wt loss abd pain Now scheduled for liver lesion biopsy and paracentesis Risks and Benefits discussed with the patient including, but not limited to bleeding, infection, damage to adjacent structures or low yield requiring additional tests. All of the patient's questions were answered, patient is agreeable to proceed. Consent signed and in chart.   Thank you for this interesting consult.  I greatly  enjoyed meeting Ruben Mccormick and look forward to participating in their care.  A copy of this report was sent to the requesting provider on this date.  Electronically Signed: Monia Sabal A 06/08/2016, 12:22 PM   I spent a total of  30 Minutes   in face to face in clinical consultation, greater than 50% of which was counseling/coordinating care for liver lesion biopsy and paracentesis

## 2016-06-08 NOTE — Sedation Documentation (Signed)
Performing liver bx now

## 2016-06-08 NOTE — Procedures (Signed)
US guided paracentesis, 550 ml of yellow fluid. US guided core biopsies of right hepatic lesion, 3 cores. Minimal blood loss No immediate complication. See full report in PACS.

## 2016-06-08 NOTE — Sedation Documentation (Signed)
md did paracentesis 1st- 550cc returned

## 2016-06-08 NOTE — Sedation Documentation (Signed)
Bandaid R flank area intact

## 2016-06-08 NOTE — Telephone Encounter (Signed)
PT seen in office on 06/03/16. Will close message

## 2016-06-11 ENCOUNTER — Encounter (HOSPITAL_COMMUNITY): Payer: Self-pay

## 2016-06-11 ENCOUNTER — Ambulatory Visit (HOSPITAL_COMMUNITY): Payer: Commercial Managed Care - PPO | Admitting: Certified Registered Nurse Anesthetist

## 2016-06-11 ENCOUNTER — Encounter (HOSPITAL_COMMUNITY)
Admission: RE | Disposition: A | Discharge status: Home / Self Care | Payer: Self-pay | Source: Ambulatory Visit | Attending: Gastroenterology

## 2016-06-11 ENCOUNTER — Ambulatory Visit (HOSPITAL_COMMUNITY)
Admission: RE | Admit: 2016-06-11 | Discharge: 2016-06-11 | Disposition: A | Discharge status: Home / Self Care | Payer: Commercial Managed Care - PPO | Source: Ambulatory Visit | Attending: Gastroenterology | Admitting: Gastroenterology

## 2016-06-11 DIAGNOSIS — K259 Gastric ulcer, unspecified as acute or chronic, without hemorrhage or perforation: Secondary | ICD-10-CM | POA: Diagnosis not present

## 2016-06-11 DIAGNOSIS — D123 Benign neoplasm of transverse colon: Secondary | ICD-10-CM | POA: Insufficient documentation

## 2016-06-11 DIAGNOSIS — R188 Other ascites: Secondary | ICD-10-CM | POA: Diagnosis not present

## 2016-06-11 DIAGNOSIS — D1339 Benign neoplasm of other parts of small intestine: Secondary | ICD-10-CM | POA: Diagnosis not present

## 2016-06-11 DIAGNOSIS — R634 Abnormal weight loss: Secondary | ICD-10-CM

## 2016-06-11 DIAGNOSIS — Z87891 Personal history of nicotine dependence: Secondary | ICD-10-CM | POA: Diagnosis not present

## 2016-06-11 DIAGNOSIS — Z7982 Long term (current) use of aspirin: Secondary | ICD-10-CM | POA: Insufficient documentation

## 2016-06-11 DIAGNOSIS — K5730 Diverticulosis of large intestine without perforation or abscess without bleeding: Secondary | ICD-10-CM

## 2016-06-11 DIAGNOSIS — K529 Noninfective gastroenteritis and colitis, unspecified: Secondary | ICD-10-CM | POA: Diagnosis present

## 2016-06-11 DIAGNOSIS — R6881 Early satiety: Secondary | ICD-10-CM | POA: Diagnosis not present

## 2016-06-11 DIAGNOSIS — I10 Essential (primary) hypertension: Secondary | ICD-10-CM | POA: Insufficient documentation

## 2016-06-11 DIAGNOSIS — K6389 Other specified diseases of intestine: Secondary | ICD-10-CM

## 2016-06-11 DIAGNOSIS — K2970 Gastritis, unspecified, without bleeding: Secondary | ICD-10-CM | POA: Diagnosis not present

## 2016-06-11 DIAGNOSIS — E782 Mixed hyperlipidemia: Secondary | ICD-10-CM | POA: Insufficient documentation

## 2016-06-11 DIAGNOSIS — D122 Benign neoplasm of ascending colon: Secondary | ICD-10-CM | POA: Diagnosis not present

## 2016-06-11 DIAGNOSIS — K2990 Gastroduodenitis, unspecified, without bleeding: Secondary | ICD-10-CM

## 2016-06-11 DIAGNOSIS — R932 Abnormal findings on diagnostic imaging of liver and biliary tract: Secondary | ICD-10-CM

## 2016-06-11 HISTORY — PX: ESOPHAGOGASTRODUODENOSCOPY (EGD) WITH PROPOFOL: SHX5813

## 2016-06-11 HISTORY — PX: COLONOSCOPY WITH PROPOFOL: SHX5780

## 2016-06-11 SURGERY — COLONOSCOPY WITH PROPOFOL
Anesthesia: Monitor Anesthesia Care

## 2016-06-11 MED ORDER — ONDANSETRON HCL 4 MG/2ML IJ SOLN
INTRAMUSCULAR | Status: DC | PRN
Start: 2016-06-11 — End: 2016-06-11
  Administered 2016-06-11: 4 mg via INTRAVENOUS

## 2016-06-11 MED ORDER — PROPOFOL 10 MG/ML IV BOLUS
INTRAVENOUS | Status: AC
  Filled 2016-06-11: qty 20

## 2016-06-11 MED ORDER — SODIUM CHLORIDE 0.9 % IV SOLN: INTRAVENOUS | Status: DC

## 2016-06-11 MED ORDER — LIDOCAINE 2% (20 MG/ML) 5 ML SYRINGE
INTRAMUSCULAR | Status: DC | PRN
  Administered 2016-06-11: 60 mg via INTRAVENOUS

## 2016-06-11 MED ORDER — PHENYLEPHRINE 40 MCG/ML (10ML) SYRINGE FOR IV PUSH (FOR BLOOD PRESSURE SUPPORT)
PREFILLED_SYRINGE | INTRAVENOUS | Status: DC | PRN
  Administered 2016-06-11: 120 ug via INTRAVENOUS
  Administered 2016-06-11: 40 ug via INTRAVENOUS
  Administered 2016-06-11: 80 ug via INTRAVENOUS
  Administered 2016-06-11 (×2): 40 ug via INTRAVENOUS

## 2016-06-11 MED ORDER — PROPOFOL 10 MG/ML IV BOLUS
INTRAVENOUS | Status: AC
  Filled 2016-06-11: qty 60

## 2016-06-11 MED ORDER — LACTATED RINGERS IV SOLN
INTRAVENOUS | Status: DC
  Administered 2016-06-11: 10:00:00 via INTRAVENOUS
  Administered 2016-06-11: 1000 mL via INTRAVENOUS
  Administered 2016-06-11: 10:00:00 via INTRAVENOUS

## 2016-06-11 MED ORDER — VASOPRESSIN 20 UNIT/ML IV SOLN
INTRAVENOUS | Status: AC
  Filled 2016-06-11: qty 1

## 2016-06-11 MED ORDER — FENTANYL CITRATE (PF) 100 MCG/2ML IJ SOLN
INTRAMUSCULAR | Status: DC | PRN
  Administered 2016-06-11: 100 ug via INTRAVENOUS

## 2016-06-11 MED ORDER — FENTANYL CITRATE (PF) 100 MCG/2ML IJ SOLN
INTRAMUSCULAR | Status: AC
  Filled 2016-06-11: qty 2

## 2016-06-11 MED ORDER — PROPOFOL 10 MG/ML IV BOLUS
INTRAVENOUS | Status: DC | PRN
  Administered 2016-06-11: 120 mg via INTRAVENOUS

## 2016-06-11 MED ORDER — OCTREOTIDE ACETATE 500 MCG/ML IJ SOLN
50.0000 ug/h | INTRAMUSCULAR | Status: DC
  Filled 2016-06-11 (×5): qty 1

## 2016-06-11 SURGICAL SUPPLY — 25 items

## 2016-06-11 NOTE — Anesthesia Procedure Notes (Signed)
Procedure Name: Intubation Performed by: Ashlyne Olenick J Pre-anesthesia Checklist: Patient identified, Emergency Drugs available, Suction available, Patient being monitored and Timeout performed Patient Re-evaluated:Patient Re-evaluated prior to inductionOxygen Delivery Method: Circle system utilized Preoxygenation: Pre-oxygenation with 100% oxygen Intubation Type: IV induction Ventilation: Mask ventilation without difficulty Laryngoscope Size: Mac and 4 Grade View: Grade II Tube type: Oral Tube size: 7.5 mm Number of attempts: 1 Airway Equipment and Method: Stylet Placement Confirmation: ETT inserted through vocal cords under direct vision,  positive ETCO2,  CO2 detector and breath sounds checked- equal and bilateral Secured at: 23 cm Tube secured with: Tape Dental Injury: Teeth and Oropharynx as per pre-operative assessment        

## 2016-06-11 NOTE — Anesthesia Preprocedure Evaluation (Addendum)
Anesthesia Evaluation  Patient identified by MRN, date of birth, ID band Patient awake    Reviewed: Allergy & Precautions, NPO status , Patient's Chart, lab work & pertinent test results  Airway Mallampati: II  TM Distance: >3 FB Neck ROM: Full    Dental no notable dental hx.    Pulmonary asthma , former smoker,    Pulmonary exam normal breath sounds clear to auscultation       Cardiovascular hypertension, negative cardio ROS Normal cardiovascular exam Rhythm:Regular Rate:Normal     Neuro/Psych negative neurological ROS  negative psych ROS   GI/Hepatic negative GI ROS, Neg liver ROS,   Endo/Other  negative endocrine ROS  Renal/GU Renal diseasenegative Renal ROS     Musculoskeletal negative musculoskeletal ROS (+)   Abdominal   Peds  Hematology negative hematology ROS (+)   Anesthesia Other Findings   Reproductive/Obstetrics negative OB ROS                                                             Anesthesia Evaluation    Airway Mallampati: II  TM Distance: >3 FB Neck ROM: Full    Dental no notable dental hx.    Pulmonary former smoker,    Pulmonary exam normal breath sounds clear to auscultation       Cardiovascular hypertension, Normal cardiovascular exam Rhythm:Regular Rate:Normal     Neuro/Psych    GI/Hepatic   Endo/Other    Renal/GU      Musculoskeletal   Abdominal   Peds  Hematology   Anesthesia Other Findings   Reproductive/Obstetrics                             Anesthesia Physical Anesthesia Plan  ASA: II  Anesthesia Plan: MAC   Post-op Pain Management:    Induction: Intravenous  Airway Management Planned:   Additional Equipment:   Intra-op Plan:   Post-operative Plan:   Informed Consent: I have reviewed the patients History and Physical, chart, labs and discussed the procedure including the risks,  benefits and alternatives for the proposed anesthesia with the patient or authorized representative who has indicated his/her understanding and acceptance.   Dental advisory given  Plan Discussed with: CRNA  Anesthesia Plan Comments:         Anesthesia Quick Evaluation  Anesthesia Physical Anesthesia Plan  ASA: IV  Anesthesia Plan: General   Post-op Pain Management:    Induction: Intravenous  Airway Management Planned: Oral ETT  Additional Equipment:   Intra-op Plan:   Post-operative Plan: Extubation in OR  Informed Consent: I have reviewed the patients History and Physical, chart, labs and discussed the procedure including the risks, benefits and alternatives for the proposed anesthesia with the patient or authorized representative who has indicated his/her understanding and acceptance.   Dental advisory given  Plan Discussed with: CRNA  Anesthesia Plan Comments: (Pt with carcinoid syndrome. Discussed with Dr. Edison Nasuti. Octreotide infusion ordered. Plan GETA to control airway and provide adequate depth of anesthesia.)       Anesthesia Quick Evaluation

## 2016-06-11 NOTE — H&P (View-Only) (Signed)
Subjective:    Patient ID: Ruben Mccormick, male    DOB: [birth year], 60 y.o.   MRN: [id number]  HPI Ruben Mccormick is a 60 year old white male, referred to Dr. Ardis Mccormick per Ruben[facility]eng for consideration of colonoscopy and EGD as part of a metastatic workup. Patient had presented with complaints of anorexia and a weight loss of about 20 pounds over the past 6 months.  To me he denies anorexia but says that he has early satiety and cannot eat nearly as much as he used to. He has not had any nausea or vomiting, denies any dysphagia or odynophagia. He is also had diarrhea over the past 2 years initially very mild and gradually progressive. He was placed on the Ruben Mccormick by his PCP Dr. Chapman Mccormick which she says does help. If he takes this he may have one to 2 bowel movements per day, if not has a bowel movement every 1-2 hours. He also complains of significant fatigue. Patient has been a longtime drinker usually 2 shots per day. Recent imaging with CT of the abdomen and pelvis 05/01/2016 showed marked reflux of contrast into the IVC and hepatic vein suggesting right heart dysfunction, hypervascular centrally necrotic 7 x 7.6 cm mass in the inferior right liver with peripheral washout, there is also a 10 mm hypervascular lesion in the lateral segment of left liver and a 3.5 cm lesion adjacent to the IVC peripheral nodular enhancement after IV contrast, pancreas unremarkable,, no small bowel wall dilation but there appears to be a short segment of circumferential wall thickening in the terminal ileum and potentially in the hepatic flexure., Small amount of ascites, and a 16 mm spiculated nodule in the right lower lobe  Subsequent PET scan 05/15/2016 shows intense metabolic activity within the right lower lobe pulmonary nodule concerning for bronchogenic carcinoma and no metabolic activity associated with the enhancing hepatic lesions of the left and right hepatic lobes wavering benign etiology possible hemangiomas MRI of the abdomen  05/24/2016 showed multiple enhancing liver lesions the largest of which is in the inferior right liver showing central necrosis MRI features are not consistent with cavernous hemangiomas or cysts and tissue sampling may be indicated  Patient is also been referred to pulmonology and saw Ruben Mccormick the last week.Marland Kitchen He was scheduled for pulmonary function tests, and CT of the chest/super D as well as echo to evaluate right heart dysfunction. Lung biopsy via bronchoscopy was discussed but not scheduled.   Review of Systems Pertinent positive and negative review of systems were noted in the above HPI section.  Mccormick other review of systems was otherwise negative.  Outpatient Encounter Prescriptions as of 05/26/2016  Medication Sig  . aspirin 325 MG tablet Take 325 mg by mouth daily.  . B-D 3CC LUER-LOK SYR 21GX1" 21G X 1" 3 ML MISC USE AS DIRECTED FOR TESTOSTERONE  . Cholecalciferol (VITAMIN D-3) 5000 UNITS TABS Take 5,000 Units by mouth daily.   . Eluxadoline (VIBERZI) 100 MG TABS Take 1 tablet by mouth 2 (two) times daily.  . fluticasone (FLONASE) 50 MCG/ACT nasal spray INSTILL 2 SPRAYS INTO THE NOSTRILS QD AT NIGHT  . hydrochlorothiazide (HYDRODIURIL) 25 MG tablet Take 1 tablet (25 mg total) by mouth daily.  Marland Kitchen PREVIDENT 5000 BOOSTER PLUS 1.1 % PSTE   . testosterone cypionate (DEPOTESTOSTERONE CYPIONATE) 200 MG/ML injection INJECT 2 ML INTO THE MUSCLE EVERY 2 WEEKS  . Triamcinolone Acetonide (TRIAMCINOLONE 0.1 % CREAM : EUCERIN) CREA Apply 1 application topically 2 (two) times daily.  Marland Kitchen  valACYclovir (VALTREX) 500 MG tablet TAKE 1 TABLET BY MOUTH TWICE DAILY AS NEEDED FOR FEVER BLISTER   No facility-administered encounter medications on file as of 05/26/2016.    Allergies  Allergen Reactions  . Bee Venom Anaphylaxis  . 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 Voltaren [Diclofenac Sodium] Rash   Patient Active Problem List   Diagnosis Date Noted  . Solitary pulmonary nodule 05/20/2016  .  Liver masses 05/15/2016  . Labile hypertension 09/21/2013  . Vitamin D deficiency 09/21/2013  . Medication management 09/21/2013  . Prediabetes 09/21/2013  . Testosterone deficiency 09/21/2013  . Mixed hyperlipidemia 08/31/2007  . ALCOHOL USE 08/31/2007  . SMOKER 08/31/2007  . 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 PROSTATITIS, HX OF 08/31/2007  . PSORIASIS, HX OF 08/31/2007  . GENITAL HERPES, HX OF 08/31/2007  . ADENOMATOUS COLONIC POLYP 08/02/2007   Social History   Social History  . Marital status: Married    Spouse name: N/A  . Number of children: N/A  . Years of education: N/A   Occupational History  . Not on file.   Social History Main Topics  . Smoking status: Former Smoker    Packs/day: 1.00    Years: 40.00    Types: Cigarettes    Quit date: 09/11/2015  . Smokeless tobacco: Never Used  . Alcohol use 16.8 oz/week    14 Standard drinks or equivalent, 14 Shots of liquor per week     Comment: 2 drinks daily since young   . Drug use: No     Comment: cocaine in the past, no iv drugs   . Sexual activity: Not on file   Other Topics Concern  . Not on file   Social History Narrative  . No narrative on file    Ruben Mccormick's family history includes Alzheimer's disease in his mother; Cancer in his father and paternal grandfather; Diabetes in his father; Parkinson's disease in his mother.      Objective:    Vitals:   05/26/16 1429  BP: 126/76  Pulse: 88    Physical Exam  well-developed older white male in no acute distress, blood pressure 126/76 pulse 88, BMI 23.3, weight 149, O2 sat 94. HEENT; Patient has a very ruddy/bluish coloration to his face neck upper chest and head, with rhinophyma changes of the nose. Positive JVD to the jaw line, Cardiovascular; regular rate and rhythm with S1-S2 soft murmur, Pulmonary ;decreased breath sounds bilaterally, Abdomen ;soft, bowel sounds are present, no definite fluid wave, liver is enlarged and palpable at least 5 fingerbreadths below  the right costal margin somewhat nodular, prominent aortic pulsation, Rectal; exam not done, Extremities ;no clubbing cyanosis or edema skin warm and dry, Neuropsych ;mood and affect appropriate       Assessment & Plan:   #75 60 year old white male with history of chronic EtOH use with weight loss of 20 pounds over the past 6 months, early satiety, fatigue and more chronic symptoms of diarrhea 2 years. Multiple recent imaging studies as outlined above with finding of a 16 mm spiculated nodule in the right lower lobe concerning for a bronchogenic carcinoma. Workup in progress per Dr. Elsworth Soho with pulmonary function tests, 2-D echo, and CT of the chest/super D format. Bronch/lung biopsy discussed but not scheduled #2 multiple liver lesions, largest  a  7.6 centimeter mass in the right liver with central necrosis, no metabolic activity on PET scan but MRI not consistent with benign hemangiomas, cannot rule out malignancy  #3 possible underlying cirrhosis #4  ascites #5 concern for right heart dysfunction on CT of the abdomen and pelvis with marked reflux of contrast into the IVC and hepatic veins On physical exam patient has a bluish very deep ruddy discoloration from his chest up and JVD on exam.  Plan; Will need to follow up on pulmonary function tests, 2-D echo and CT of the chest Discussed with Dr. Ardis Mccormick who also met the patient and will proceed with scheduling of EGD and colonoscopy to be done at the hospital per Dr. Ardis Mccormick in 2-3 weeks. Procedures were both discussed in detail with the patient and he is agreeable to proceed. Concerned about underlying significant right heart dysfunction which may make sedation high risk. We'll discuss further with Dr. Ardis Mccormick, regarding recommendation that he have a lung biopsy, and biopsy of the larger liver lesion, possibly prior to EGD and colonoscopy. We'll check CBC, CMET, INR today. He has not had tumor markers done.   Amy S Esterwood  PA-C 05/26/2016   Cc: Unk Pinto, MD

## 2016-06-11 NOTE — Interval H&P Note (Signed)
History and Physical Interval Note:  06/11/2016 8:32 AM  Ruben Mccormick  has presented today for surgery, with the diagnosis of Weight loss Early satiety abnormal imaging of liver/abdomen  The various methods of treatment have been discussed with the patient and family. After consideration of risks, benefits and other options for treatment, the patient has consented to  Procedure(s): COLONOSCOPY WITH PROPOFOL (N/A) ESOPHAGOGASTRODUODENOSCOPY (EGD) WITH PROPOFOL (N/A) as a surgical intervention .  The patient's history has been reviewed, patient examined, no change in status, stable for surgery.  I have reviewed the patient's chart and labs.  Questions were answered to the patient's satisfaction.     Ruben Mccormick

## 2016-06-11 NOTE — Discharge Instructions (Signed)

## 2016-06-11 NOTE — Op Note (Signed)
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 Estimated blood loss:                            None. Estimated Blood Loss:     Estimated blood loss: none. Procedure:                Pre-Anesthesia Assessment:                           - Prior to the procedure, a History and Physical                            was performed, and patient medications and                            allergies were reviewed. The patient's tolerance of                            previous anesthesia was also reviewed. The risks                            and benefits of the procedure and the sedation                            options and risks were discussed with the patient.                            All questions were answered, and informed consent                            was obtained. Prior Anticoagulants: The patient has                            taken no previous anticoagulant or antiplatelet                            agents. ASA Grade Assessment: III - A patient with  severe systemic disease. After reviewing the risks                            and benefits, the patient was deemed in                            satisfactory condition to undergo the procedure.                           After obtaining informed consent, the endoscope was                            passed under direct vision. Throughout the   procedure, the patient's blood pressure, pulse, and                            oxygen saturations were monitored continuously. The                            EG-[id number] ([id number]) scope was introduced through the                            mouth, and advanced to the second part of duodenum.                            The upper GI endoscopy was accomplished without                            difficulty. The patient tolerated the procedure                            well. Scope In: Scope Out: Findings:      The esophagus was normal.      The examined duodenum was normal.      The distal 1/3 of the stomach was very abnormal. The mucosa was       edematous, somewhat neoplatic (not circumferentially) and there were two       clean based ulcers present (each 4-47m across). Multiple biopsies were       taken with a cold forceps for histology.      The exam was otherwise without abnormality. Impression:               - Normal esophagus.                           - Normal examined duodenum.                           - Abnromal distal stomach, this was somewhat                            neoplastic appearing, biospied extensively.                           - The examination was otherwise normal. Moderate Sedation:      N/A- Per Anesthesia Care Recommendation:           -  Patient has a contact number available for                            emergencies. The signs and symptoms of potential                            delayed complications were discussed with the                            patient. Return to normal activities tomorrow.                            Written discharge instructions were provided to the                            patient.                           - Resume previous diet.                           - Continue present medications.                           - No repeat upper endoscopy. Procedure Code(s):        --- Professional ---                           [phone],  Esophagogastroduodenoscopy, flexible,                            transoral; with biopsy, single or multiple Diagnosis Code(s):        --- Professional ---                           K25.9, Gastric ulcer, unspecified as acute or                            chronic, without hemorrhage or perforation                           K30, Functional dyspepsia CPT copyright 2016 American Medical Association. All rights reserved. The codes documented in this report are preliminary and upon coder review may  be revised to meet current compliance requirements. Milus Banister, MD 06/11/2016 10:42:00 AM This report has been signed electronically. Number of Addenda: 0

## 2016-06-11 NOTE — Anesthesia Postprocedure Evaluation (Signed)
Anesthesia Post Note  Patient: Ruben Mccormick  Procedure(s) Performed: Procedure(s) (LRB): COLONOSCOPY WITH PROPOFOL (N/A) ESOPHAGOGASTRODUODENOSCOPY (EGD) WITH PROPOFOL (N/A)  Patient location during evaluation: PACU Anesthesia Type: General Level of consciousness: sedated and patient cooperative Pain management: pain level controlled Vital Signs Assessment: post-procedure vital signs reviewed and stable Respiratory status: spontaneous breathing Cardiovascular status: stable Anesthetic complications: no    Last Vitals:  Vitals:   06/11/16 1140 06/11/16 1150  BP: 131/80 121/80  Pulse: 76 77  Resp: (!) 23 17  Temp:      Last Pain:  Vitals:   06/11/16 1054  TempSrc: Axillary                 Nolon Nations

## 2016-06-11 NOTE — Transfer of Care (Signed)
Immediate Anesthesia Transfer of Care Note  Patient: Ruben Mccormick  Procedure(s) Performed: Procedure(s): COLONOSCOPY WITH PROPOFOL (N/A) ESOPHAGOGASTRODUODENOSCOPY (EGD) WITH PROPOFOL (N/A)  Patient Location: PACU  Anesthesia Type:General  Level of Consciousness: sedated, patient cooperative and responds to stimulation  Airway & Oxygen Therapy: Patient Spontanous Breathing and Patient connected to face mask oxygen  Post-op Assessment: Report given to RN and Post -op Vital signs reviewed and stable  Post vital signs: Reviewed and stable  Last Vitals:  Vitals:   06/11/16 0835  BP: (!) 148/98  Pulse: 83  Resp: (!) 24  Temp: 36.7 C    Last Pain:  Vitals:   06/11/16 0835  TempSrc: Oral         Complications: No apparent anesthesia complications

## 2016-06-11 NOTE — Op Note (Addendum)
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 Deatra Ina Providers:                Milus Banister, MD, Laverta Baltimore RN, RN,                            Ralene Bathe, Technician, Herbie Drape, CRNA Referring MD:             Truitt Merle, MD Medicines:                General Anesthesia Complications:            No immediate complications. Estimated blood loss:                            None. Estimated Blood Loss:     Estimated blood loss: none. Procedure:                Pre-Anesthesia Assessment:                           - Prior to the procedure, a History and Physical                            was performed, and patient medications and                            allergies were reviewed. The patient's tolerance of                            previous anesthesia was also reviewed. The risks                            and benefits of the procedure and the sedation                            options and risks were discussed with the patient.                            All questions were answered, and informed consent                            was obtained. Prior Anticoagulants: The patient has                            taken  no previous anticoagulant or antiplatelet                            agents. ASA Grade Assessment: III - A patient with                            severe systemic disease. After reviewing the risks                            and benefits, the patient was deemed in                            satisfactory condition to undergo the procedure.                           After obtaining informed  consent, the colonoscope                            was passed under direct vision. Throughout the                            procedure, the patient's blood pressure, pulse, and                            oxygen saturations were monitored continuously. The                            was introduced through the anus and advanced to the                            the terminal ileum. The colonoscopy was performed                            without difficulty. The patient tolerated the                            procedure well. The quality of the bowel                            preparation was good. The terminal ileum, ileocecal                            valve, appendiceal orifice, and rectum were                            photographed. Scope In: 9:54:52 AM Scope Out: 10:19:04 AM Scope Withdrawal Time: 0 hours 18 minutes 48 seconds  Total Procedure Duration: 0 hours 24 minutes 12 seconds  Findings:      The terminal ileum contained one semi-pedunculated, non-bleeding       irregular appearing (hyperemic, friable, lobular) polyp. The polyp was       15 mm in diameter. Biopsies were taken with a cold forceps for       histology. (jar 1)      Two sessile polyps were  found in the transverse colon and ascending       colon. The polyps were 8 to 11 mm in size. These polyps were removed       with a hot snare. Resection and retrieval were complete. (jar 2)      A 5 mm polyp was found in the transverse colon. The polyp was sessile.       The polyp was removed with a cold snare. Resection and retrieval were       complete. (jar 2)      Diverticula were found in the left colon. Impression:               - One abnormal appearing ileal polypoid leion in                            the terminal ileum. Biopsied.                           - Two 8 to 11 mm polyps in the transverse colon and                            in the ascending colon, removed with a hot snare.                            Resected  and retrieved.                           - One 5 mm polyp in the transverse colon, removed                            with a cold snare. Resected and retrieved.                           - Diverticulosis in the left colon. Moderate Sedation:      N/A- Per Anesthesia Care Recommendation:           - Patient has a contact number available for                            emergencies. The signs and symptoms of potential                            delayed complications were discussed with the                            patient. Return to normal activities tomorrow.                            Written discharge instructions were provided to the                            patient.                           - Resume previous diet.                           -  Continue present medications.                           - Await finalp path results.                           - EGD now. Procedure Code(s):        --- Professional ---                           [phone], Colonoscopy, flexible; with removal of                            tumor(s), polyp(s), or other lesion(s) by snare                            technique                           45380, 27, Colonoscopy, flexible; with biopsy,                            single or multiple Diagnosis Code(s):        --- Professional ---                           D13.39, Benign neoplasm of other parts of small                            intestine                           D12.3, Benign neoplasm of transverse colon (hepatic                            flexure or splenic flexure)                           D12.2, Benign neoplasm of ascending colon                           K52.9, Noninfective gastroenteritis and colitis,                            unspecified                           K57.30, Diverticulosis of large intestine without                            perforation or abscess without bleeding CPT copyright 2016 American Medical Association. All rights  reserved. The codes documented in this report are preliminary and upon coder review may  be revised to meet current compliance requirements. Milus Banister, MD 06/11/2016 10:36:50 AM This report has been signed electronically. Number of Addenda: 0

## 2016-06-12 ENCOUNTER — Encounter (HOSPITAL_COMMUNITY): Payer: Self-pay | Admitting: Gastroenterology

## 2016-06-15 ENCOUNTER — Ambulatory Visit (HOSPITAL_BASED_OUTPATIENT_CLINIC_OR_DEPARTMENT_OTHER): Payer: Commercial Managed Care - PPO | Admitting: Hematology

## 2016-06-15 ENCOUNTER — Ambulatory Visit: Payer: Self-pay | Admitting: Internal Medicine

## 2016-06-15 DIAGNOSIS — K529 Noninfective gastroenteritis and colitis, unspecified: Secondary | ICD-10-CM | POA: Diagnosis not present

## 2016-06-15 DIAGNOSIS — E340 Carcinoid syndrome: Secondary | ICD-10-CM

## 2016-06-15 DIAGNOSIS — C7A00 Malignant carcinoid tumor of unspecified site: Secondary | ICD-10-CM | POA: Diagnosis not present

## 2016-06-15 DIAGNOSIS — R630 Anorexia: Secondary | ICD-10-CM

## 2016-06-15 DIAGNOSIS — R634 Abnormal weight loss: Secondary | ICD-10-CM

## 2016-06-15 DIAGNOSIS — C7B02 Secondary carcinoid tumors of liver: Secondary | ICD-10-CM | POA: Insufficient documentation

## 2016-06-16 ENCOUNTER — Telehealth: Payer: Self-pay | Admitting: Hematology

## 2016-06-16 ENCOUNTER — Ambulatory Visit: Payer: Commercial Managed Care - PPO | Admitting: Hematology

## 2016-06-16 ENCOUNTER — Telehealth: Payer: Self-pay | Admitting: *Deleted

## 2016-06-16 NOTE — Telephone Encounter (Signed)
"  I missed a call.  Who called and what is this in reference to?"   Scheduler called for today's 4:00 appointment.  Call transferred to [phone] to reschedule.  "I'm currently in [place], [place]."

## 2016-06-16 NOTE — Telephone Encounter (Signed)
lvm to inform pt of 11/7 appt per LOS. Pt can call to r/s appt if unable to make it

## 2016-06-18 ENCOUNTER — Encounter (HOSPITAL_COMMUNITY): Payer: Self-pay | Admitting: *Deleted

## 2016-06-18 ENCOUNTER — Other Ambulatory Visit: Payer: Self-pay | Admitting: Pulmonary Disease

## 2016-06-18 NOTE — Progress Notes (Signed)
Spoke with pt for pre-op call. Pt denies cardiac history, chest pain or sob.

## 2016-06-19 ENCOUNTER — Ambulatory Visit (HOSPITAL_COMMUNITY): Payer: Commercial Managed Care - PPO | Admitting: Emergency Medicine

## 2016-06-19 ENCOUNTER — Encounter (HOSPITAL_COMMUNITY): Payer: Self-pay | Admitting: *Deleted

## 2016-06-19 ENCOUNTER — Ambulatory Visit (HOSPITAL_COMMUNITY): Payer: Commercial Managed Care - PPO

## 2016-06-19 ENCOUNTER — Ambulatory Visit (HOSPITAL_COMMUNITY)
Admission: RE | Admit: 2016-06-19 | Discharge: 2016-06-19 | Disposition: A | Discharge status: Home / Self Care | Payer: Commercial Managed Care - PPO | Source: Ambulatory Visit | Attending: Pulmonary Disease | Admitting: Pulmonary Disease

## 2016-06-19 ENCOUNTER — Encounter (HOSPITAL_COMMUNITY)
Admission: RE | Disposition: A | Discharge status: Home / Self Care | Payer: Self-pay | Source: Ambulatory Visit | Attending: Pulmonary Disease

## 2016-06-19 DIAGNOSIS — C7B09 Secondary carcinoid tumors of other sites: Secondary | ICD-10-CM | POA: Insufficient documentation

## 2016-06-19 DIAGNOSIS — R911 Solitary pulmonary nodule: Secondary | ICD-10-CM

## 2016-06-19 DIAGNOSIS — Z87891 Personal history of nicotine dependence: Secondary | ICD-10-CM | POA: Diagnosis not present

## 2016-06-19 DIAGNOSIS — Z419 Encounter for procedure for purposes other than remedying health state, unspecified: Secondary | ICD-10-CM

## 2016-06-19 DIAGNOSIS — Z91030 Bee allergy status: Secondary | ICD-10-CM | POA: Diagnosis not present

## 2016-06-19 DIAGNOSIS — R0609 Other forms of dyspnea: Secondary | ICD-10-CM | POA: Diagnosis not present

## 2016-06-19 DIAGNOSIS — K279 Peptic ulcer, site unspecified, unspecified as acute or chronic, without hemorrhage or perforation: Secondary | ICD-10-CM | POA: Diagnosis not present

## 2016-06-19 DIAGNOSIS — Z9889 Other specified postprocedural states: Secondary | ICD-10-CM

## 2016-06-19 DIAGNOSIS — Z888 Allergy status to other drugs, medicaments and biological substances status: Secondary | ICD-10-CM | POA: Diagnosis not present

## 2016-06-19 DIAGNOSIS — B1910 Unspecified viral hepatitis B without hepatic coma: Secondary | ICD-10-CM | POA: Insufficient documentation

## 2016-06-19 DIAGNOSIS — R188 Other ascites: Secondary | ICD-10-CM | POA: Diagnosis not present

## 2016-06-19 DIAGNOSIS — J45909 Unspecified asthma, uncomplicated: Secondary | ICD-10-CM | POA: Insufficient documentation

## 2016-06-19 DIAGNOSIS — E340 Carcinoid syndrome: Secondary | ICD-10-CM | POA: Insufficient documentation

## 2016-06-19 DIAGNOSIS — J988 Other specified respiratory disorders: Secondary | ICD-10-CM

## 2016-06-19 DIAGNOSIS — Z7982 Long term (current) use of aspirin: Secondary | ICD-10-CM | POA: Diagnosis not present

## 2016-06-19 HISTORY — PX: VIDEO BRONCHOSCOPY WITH ENDOBRONCHIAL NAVIGATION: SHX6175

## 2016-06-19 HISTORY — DX: Personal history of urinary calculi: Z87.442

## 2016-06-19 SURGERY — VIDEO BRONCHOSCOPY WITH ENDOBRONCHIAL NAVIGATION
Anesthesia: General | Laterality: Right

## 2016-06-19 MED ORDER — 0.9 % SODIUM CHLORIDE (POUR BTL) OPTIME
TOPICAL | Status: DC | PRN
  Administered 2016-06-19: 1000 mL

## 2016-06-19 MED ORDER — SUGAMMADEX SODIUM 200 MG/2ML IV SOLN
INTRAVENOUS | Status: DC | PRN
  Administered 2016-06-19: 140 mg via INTRAVENOUS

## 2016-06-19 MED ORDER — SUGAMMADEX SODIUM 200 MG/2ML IV SOLN
INTRAVENOUS | Status: AC
  Filled 2016-06-19: qty 2

## 2016-06-19 MED ORDER — ROCURONIUM BROMIDE 100 MG/10ML IV SOLN
INTRAVENOUS | Status: DC | PRN
  Administered 2016-06-19: 40 mg via INTRAVENOUS
  Administered 2016-06-19: 30 mg via INTRAVENOUS

## 2016-06-19 MED ORDER — EPINEPHRINE PF 1 MG/ML IJ SOLN
INTRAMUSCULAR | Status: AC
  Filled 2016-06-19: qty 1

## 2016-06-19 MED ORDER — FENTANYL CITRATE (PF) 100 MCG/2ML IJ SOLN: 25.0000 ug | INTRAMUSCULAR | Status: DC | PRN

## 2016-06-19 MED ORDER — PHENYLEPHRINE HCL 10 MG/ML IJ SOLN
INTRAVENOUS | Status: DC | PRN
  Administered 2016-06-19: 20 ug/min via INTRAVENOUS

## 2016-06-19 MED ORDER — MIDAZOLAM HCL 2 MG/2ML IJ SOLN
INTRAMUSCULAR | Status: AC
  Filled 2016-06-19: qty 2

## 2016-06-19 MED ORDER — PROMETHAZINE HCL 25 MG/ML IJ SOLN: 6.2500 mg | INTRAMUSCULAR | Status: DC | PRN

## 2016-06-19 MED ORDER — REMIFENTANIL HCL 1 MG IV SOLR
0.0500 ug/kg/min | INTRAVENOUS | Status: DC
  Filled 2016-06-19: qty 5000

## 2016-06-19 MED ORDER — PHENYLEPHRINE HCL 10 MG/ML IJ SOLN
INTRAMUSCULAR | Status: DC | PRN
  Administered 2016-06-19 (×2): 80 ug via INTRAVENOUS

## 2016-06-19 MED ORDER — LACTATED RINGERS IV SOLN
INTRAVENOUS | Status: DC
  Administered 2016-06-19 (×3): via INTRAVENOUS

## 2016-06-19 MED ORDER — PROPOFOL 10 MG/ML IV BOLUS
INTRAVENOUS | Status: DC | PRN
  Administered 2016-06-19: 150 mg via INTRAVENOUS

## 2016-06-19 MED ORDER — FENTANYL CITRATE (PF) 100 MCG/2ML IJ SOLN
INTRAMUSCULAR | Status: AC
  Filled 2016-06-19: qty 2

## 2016-06-19 MED ORDER — FENTANYL CITRATE (PF) 100 MCG/2ML IJ SOLN
INTRAMUSCULAR | Status: DC | PRN
  Administered 2016-06-19: 50 ug via INTRAVENOUS

## 2016-06-19 MED ORDER — LIDOCAINE HCL (CARDIAC) 20 MG/ML IV SOLN
INTRAVENOUS | Status: DC | PRN
  Administered 2016-06-19: 20 mg via INTRAVENOUS

## 2016-06-19 MED ORDER — SODIUM CHLORIDE 0.9 % IV SOLN
25.0000 ug/h | INTRAVENOUS | Status: DC
  Filled 2016-06-19: qty 1

## 2016-06-19 MED ORDER — PROPOFOL 10 MG/ML IV BOLUS
INTRAVENOUS | Status: AC
  Filled 2016-06-19: qty 20

## 2016-06-19 MED ORDER — ONDANSETRON HCL 4 MG/2ML IJ SOLN
INTRAMUSCULAR | Status: DC | PRN
  Administered 2016-06-19: 4 mg via INTRAVENOUS

## 2016-06-19 SURGICAL SUPPLY — 36 items
ADAPTER BRONCH F/PENTAX (ADAPTER) ×3 IMPLANT
ADPR BSCP EDG PNTX (ADAPTER) ×1
BRUSH SUPERTRAX BIOPSY (INSTRUMENTS) IMPLANT
BRUSH SUPERTRAX NDL-TIP CYTO (INSTRUMENTS) IMPLANT
CANISTER SUCTION 2500CC (MISCELLANEOUS) ×3 IMPLANT
CHANNEL WORK EXTEND EDGE 180 (KITS) IMPLANT
CHANNEL WORK EXTEND EDGE 45 (KITS) IMPLANT
CHANNEL WORK EXTEND EDGE 90 (KITS) IMPLANT
CONT SPEC 4OZ CLIKSEAL STRL BL (MISCELLANEOUS) ×3 IMPLANT
COVER TABLE BACK 60X90 (DRAPES) ×3 IMPLANT
DRAPE C-ARM 42X72 X-RAY (DRAPES) ×1 IMPLANT
FILTER STRAW FLUID ASPIR (MISCELLANEOUS) IMPLANT
FORCEPS BIOP SUPERTRX PREMAR (INSTRUMENTS) IMPLANT
GAUZE SPONGE 4X4 12PLY STRL (GAUZE/BANDAGES/DRESSINGS) ×3 IMPLANT
GLOVE SURG SIGNA 7.5 PF LTX (GLOVE) ×3 IMPLANT
KIT CLEAN ENDO COMPLIANCE (KITS) ×3 IMPLANT
KIT LOCATABLE GUIDE (CANNULA) IMPLANT
KIT MARKER FIDUCIAL DELIVERY (KITS) IMPLANT
KIT PROCEDURE EDGE 180 (KITS) IMPLANT
KIT PROCEDURE EDGE 45 (KITS) IMPLANT
KIT PROCEDURE EDGE 90 (KITS) IMPLANT
KIT ROOM TURNOVER OR (KITS) ×3 IMPLANT
MARKER SKIN DUAL TIP RULER LAB (MISCELLANEOUS) ×3 IMPLANT
NDL SUPERTRX PREMARK BIOPSY (NEEDLE) IMPLANT
NEEDLE SUPERTRX PREMARK BIOPSY (NEEDLE) IMPLANT
NS IRRIG 1000ML POUR BTL (IV SOLUTION) ×3 IMPLANT
OIL SILICONE PENTAX (PARTS (SERVICE/REPAIRS)) ×3 IMPLANT
PAD ARMBOARD 7.5X6 YLW CONV (MISCELLANEOUS) ×6 IMPLANT
PATCHES PATIENT (LABEL) ×7 IMPLANT
SYR 20ML ECCENTRIC (SYRINGE) ×3 IMPLANT
SYR 30ML LL (SYRINGE) ×3 IMPLANT
TOWEL OR 17X24 6PK STRL BLUE (TOWEL DISPOSABLE) ×3 IMPLANT
TRAP SPECIMEN MUCOUS 40CC (MISCELLANEOUS) ×3 IMPLANT
TUBE CONNECTING 20'X1/4 (TUBING) ×1
TUBE CONNECTING 20X1/4 (TUBING) ×2 IMPLANT
WATER STERILE IRR 1000ML POUR (IV SOLUTION) ×3 IMPLANT

## 2016-06-19 NOTE — Procedures (Signed)
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  Estimated Blood Loss: Minimal  Complications: none  Indications and History: Ruben Mccormick is a 60 y.o. 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  The risks, benefits, complications, treatment options and expected outcomes were discussed with the patient.  The possibilities of pneumothorax, pneumonia, reaction to medication, pulmonary aspiration, perforation of a viscus, bleeding, failure to diagnose a condition and creating a complication requiring transfusion or operation were discussed with the patient who freely signed the consent.    Description of Procedure: The patient was seen in the Preoperative Area, was examined and was deemed appropriate to proceed.  The patient was taken to OR 10, identified as Ruben Mccormick and the procedure verified as Flexible Video Fiberoptic Bronchoscopy. Anesthesia inserted a-line given h/o carcinoid syndrome. A Time Out was held and the above information confirmed.   Prior to the date of the procedure a high-resolution CT scan of the chest was performed. Utilizing [name] a virtual tracheobronchial tree was generated to allow the creation of distinct navigation pathways to the patient's parenchymal abnormalities. After being taken to the operating room general anesthesia was initiated and the patient  was orally intubated. The video fiberoptic bronchoscope was introduced via the endotracheal tube and a general inspection was performed which showed moderate clear & white secretions. The extendable working channel and locator  guide were introduced into the bronchoscope. The distinct navigation pathways prepared prior to this procedure were then utilized to navigate to within 1 cm of patient's lesion(s) identified on CT scan. The extendable working channel was secured into place and the locator guide was withdrawn. Under fluoroscopic guidance transbronchial needle brushings and transbronchial forceps biopsies were performed to be sent for cytology and pathology. A bronchioalveolar lavage was performed in the RLL and sent for cytology and microbiology (bacterial, fungal, AFB smears and cultures). At the end of the procedure a general airway inspection was performed and there was no evidence of active bleeding. The bronchoscope was removed.  The patient tolerated the procedure well. There was no significant blood loss and there were no obvious complications. A post-procedural chest x-ray is pending.  Samples: 1. Transbronchial needle brushings from RLL nodule  2. Transbronchial Wang needle biopsies-not done  3. Transbronchial forceps biopsies from RLL nodule  4. Bronchoalveolar lavage from RLL   Plans:  The patient will be discharged from the PACU to home when recovered from anesthesia and after chest x-ray is reviewed. We will review the cytology, pathology and microbiology results with the patient when they become available. Outpatient followup will be with Oncology & me.    [name] MD 06/19/2016

## 2016-06-19 NOTE — Anesthesia Postprocedure Evaluation (Signed)
Anesthesia Post Note  Patient: Ruben Mccormick  Procedure(s) Performed: Procedure(s) (LRB): VIDEO BRONCHOSCOPY WITH ENDOBRONCHIAL NAVIGATION with RLL brushings, washings and bx (Right)  Patient location during evaluation: PACU Anesthesia Type: General Level of consciousness: awake and alert and patient cooperative Pain management: pain level controlled Vital Signs Assessment: post-procedure vital signs reviewed and stable Respiratory status: spontaneous breathing and respiratory function stable Cardiovascular status: stable Anesthetic complications: no    Last Vitals:  Vitals:   06/19/16 1956 06/19/16 2000  BP: 115/86   Pulse: 87 87  Resp: (!) 22 (!) 25  Temp:      Last Pain:  Vitals:   06/19/16 1945  TempSrc:   PainSc: 0-No pain                 [name]

## 2016-06-19 NOTE — Progress Notes (Signed)
Pt states he was told not to eat or drink after 0700 today. States he had a cup of coffee and a bowl of cereal at 0730. Dr Ola Spurr informed.

## 2016-06-19 NOTE — OR Nursing (Signed)
Left brachial arterial line removed when therapy completed.  Manual pressure held until hemastasis was obtained.  +2 distal pulses in Left arm.  Dressing placed.  Pt tolerated well.

## 2016-06-19 NOTE — Anesthesia Procedure Notes (Signed)
Procedure Name: Intubation Date/Time: 06/19/2016 4:36 PM Performed by: Oletta Lamas Pre-anesthesia Checklist: Patient identified, Emergency Drugs available, Suction available and Patient being monitored Patient Re-evaluated:Patient Re-evaluated prior to inductionOxygen Delivery Method: Circle System Utilized Preoxygenation: Pre-oxygenation with 100% oxygen Intubation Type: IV induction Ventilation: Mask ventilation without difficulty Laryngoscope Size: Mac and 3 Grade View: Grade I Tube type: Oral Tube size: 7.0 mm Number of attempts: 1 Airway Equipment and Method: Stylet and Oral airway Placement Confirmation: ETT inserted through vocal cords under direct vision,  positive ETCO2 and breath sounds checked- equal and bilateral Secured at: 22 cm Tube secured with: Tape Dental Injury: Teeth and Oropharynx as per pre-operative assessment

## 2016-06-19 NOTE — Discharge Instructions (Signed)

## 2016-06-19 NOTE — Anesthesia Procedure Notes (Signed)
Date/Time: 06/19/2016 4:00 PM Performed by: Oletta Lamas

## 2016-06-19 NOTE — Transfer of Care (Signed)
Immediate Anesthesia Transfer of Care Note  Patient: Ruben Mccormick  Procedure(s) Performed: Procedure(s): VIDEO BRONCHOSCOPY WITH ENDOBRONCHIAL NAVIGATION with RLL brushings, washings and bx (Right)  Patient Location: PACU  Anesthesia Type:General  Level of Consciousness: awake, alert  and oriented  Airway & Oxygen Therapy: Patient Spontanous Breathing and Patient connected to face mask oxygen  Post-op Assessment: Report given to RN and Post -op Vital signs reviewed and stable  Post vital signs: Reviewed and stable  Last Vitals:  Vitals:   06/19/16 1156 06/19/16 1900  BP: 137/87 115/70  Pulse: 79 83  Resp: 18 20  Temp: 36.6 C 36.3 C    Last Pain:  Vitals:   06/19/16 1900  TempSrc:   PainSc: 0-No pain         Complications: No apparent anesthesia complications

## 2016-06-19 NOTE — Anesthesia Preprocedure Evaluation (Addendum)
Anesthesia Evaluation  Patient identified by MRN, date of birth, ID band Patient awake    Reviewed: Allergy & Precautions, NPO status , Patient's Chart, lab work & pertinent test results  Airway Mallampati: II  TM Distance: >3 FB Neck ROM: Full    Dental  (+) Dental Advisory Given   Pulmonary asthma , former smoker,    breath sounds clear to auscultation       Cardiovascular hypertension, Pt. on medications  Rhythm:Regular Rate:Normal     Neuro/Psych    GI/Hepatic PUD,   Endo/Other  negative endocrine ROSCarcinoid syndrome with mets to lungs.  Renal/GU Renal disease     Musculoskeletal   Abdominal   Peds  Hematology negative hematology ROS (+)   Anesthesia Other Findings   Reproductive/Obstetrics                           Lab Results  Component Value Date   WBC 5.8 06/08/2016   HGB 15.8 06/08/2016   HCT 49.5 06/08/2016   MCV 93.2 06/08/2016   PLT 188 06/08/2016   Lab Results  Component Value Date   CREATININE 1.02 06/08/2016   BUN 9 06/08/2016   NA 140 06/08/2016   K 4.3 06/08/2016   CL 101 06/08/2016   CO2 30 06/08/2016    Anesthesia Physical Anesthesia Plan  ASA: III  Anesthesia Plan: General   Post-op Pain Management:    Induction: Intravenous  Airway Management Planned: Oral ETT  Additional Equipment:   Intra-op Plan:   Post-operative Plan: Extubation in OR and Possible Post-op intubation/ventilation  Informed Consent: I have reviewed the patients History and Physical, chart, labs and discussed the procedure including the risks, benefits and alternatives for the proposed anesthesia with the patient or authorized representative who has indicated his/her understanding and acceptance.   Dental advisory given  Plan Discussed with: CRNA  Anesthesia Plan Comments:        Anesthesia Quick Evaluation

## 2016-06-19 NOTE — Interval H&P Note (Signed)
History and Physical Interval Note:  06/19/2016 3:10 PM  Ruben Mccormick  has presented today for surgery, with the diagnosis of LUNG MASS  The various methods of treatment have been discussed with the patient and family. After consideration of risks, benefits and other options for treatment, the patient has consented to  Procedure(s): [place] (N/A) as a surgical intervention .  The patient's history has been reviewed, patient examined, no change in status, stable for surgery.  I have reviewed the patient's chart and labs.  Questions were answered to the patient's satisfaction.     ALVA,RAKESH V.

## 2016-06-19 NOTE — H&P (View-Only) (Signed)
   Subjective:    Patient ID: Ruben Mccormick, male    DOB: 06-18-1956, 60 y.o.   MRN: [id number]  HPI  60 year old smoker for FU of lung nodule noted incidentally on abdominal imaging.  He smokes about a pack per day for 40 years-about 40 pack years until he quit in February 2017. He also drinks 2-3 mixed drinks daily. He carries a diagnosis of hepatitis B and has not been treated for this. He reports chronic diarrhea for which she underwent an ultrasound abdomen by his PCP-this showed liver lesions.   06/03/2016 Chief Complaint  Patient presents with  . Follow-up    2wk rov. breathingis baseline. pt c/o sob with exertion. denies any cough, wheezing or cp/tightness.   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 16 mm spiculated lesion was noted in the right lower lobe. There was some reflux of contrast down the IVC suggesting right heart dysfunction  PET scan 05/2016 showed hypermetabolism in the dominant liver nodule and in the right lower lobe lesion. Smaller nodules in the liver did not light up  PFTs 05/2016 show a ratio of 42, FEV1 of 30% that improved by 40% to 42% post Bronchodilator, FVC of 54% and DLCO of 54%  Review of Systems neg for any significant sore throat, dysphagia, itching, sneezing, nasal congestion or excess/ purulent secretions, fever, chills, sweats, unintended wt loss, pleuritic or exertional cp, hempoptysis, orthopnea pnd or change in chronic leg swelling. Also denies presyncope, palpitations, heartburn, abdominal pain, nausea, vomiting, diarrhea or change in bowel or urinary habits, dysuria,hematuria, rash, arthralgias, visual complaints, headache, numbness  weakness or ataxia.     Objective:   Physical Exam  Gen. Pleasant, well-nourished, in no distress ENT - no lesions, no post nasal , ruddy nose Neck: No JVD, no thyromegaly, no carotid bruits Lungs: no use of accessory muscles, no dullness to percussion, clear without rales or rhonchi  Cardiovascular: Rhythm regular, heart sounds  normal, no murmurs or gallops, no peripheral edema Musculoskeletal: No deformities, no cyanosis or clubbing        Assessment & Plan:

## 2016-06-20 ENCOUNTER — Other Ambulatory Visit: Payer: Self-pay | Admitting: Internal Medicine

## 2016-06-20 ENCOUNTER — Encounter (HOSPITAL_COMMUNITY): Payer: Self-pay | Admitting: Pulmonary Disease

## 2016-06-20 LAB — POCT I-STAT 7, (LYTES, BLD GAS, ICA,H+H)
Acid-Base Excess: 3 mmol/L — ABNORMAL HIGH (ref 0.0–2.0)
Acid-Base Excess: 7 mmol/L — ABNORMAL HIGH (ref 0.0–2.0)
BICARBONATE: 30.2 mmol/L — AB (ref 20.0–28.0)
Bicarbonate: 36.1 mmol/L — ABNORMAL HIGH (ref 20.0–28.0)
Calcium, Ion: 1.15 mmol/L (ref 1.15–1.40)
Calcium, Ion: 1.17 mmol/L (ref 1.15–1.40)
HCT: 40 % (ref 39.0–52.0)
HEMATOCRIT: 46 % (ref 39.0–52.0)
HEMOGLOBIN: 15.6 g/dL (ref 13.0–17.0)
Hemoglobin: 13.6 g/dL (ref 13.0–17.0)
O2 Saturation: 100 %
O2 Saturation: 100 %
PCO2 ART: 56 mmHg — AB (ref 32.0–48.0)
PCO2 ART: 69.8 mmHg — AB (ref 32.0–48.0)
PO2 ART: 515 mmHg — AB (ref 83.0–108.0)
POTASSIUM: 4.2 mmol/L (ref 3.5–5.1)
Patient temperature: 36.2
Patient temperature: 36.5
Potassium: 3.6 mmol/L (ref 3.5–5.1)
Sodium: 138 mmol/L (ref 135–145)
Sodium: 140 mmol/L (ref 135–145)
TCO2: 32 mmol/L (ref 0–100)
TCO2: 38 mmol/L (ref 0–100)
pH, Arterial: 7.32 — ABNORMAL LOW (ref 7.350–7.450)
pH, Arterial: 7.336 — ABNORMAL LOW (ref 7.350–7.450)
pO2, Arterial: 633 mmHg — ABNORMAL HIGH (ref 83.0–108.0)

## 2016-06-20 LAB — ACID FAST SMEAR (AFB): ACID FAST SMEAR - AFSCU2: NEGATIVE

## 2016-06-20 LAB — ACID FAST SMEAR (AFB, MYCOBACTERIA)

## 2016-06-20 NOTE — Op Note (Signed)
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  Estimated Blood Loss: Minimal  Complications: none  Indications and History: Ruben Mccormick is a 60 y.o. 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  The risks, benefits, complications, treatment options and expected outcomes were discussed with the patient.  The possibilities of pneumothorax, pneumonia, reaction to medication, pulmonary aspiration, perforation of a viscus, bleeding, failure to diagnose a condition and creating a complication requiring transfusion or operation were discussed with the patient who freely signed the consent.    Description of Procedure: The patient was seen in the Preoperative Area, was examined and was deemed appropriate to proceed.  The patient was taken to OR 10, identified as Lotus Hightower and the procedure verified as Flexible Video Fiberoptic Bronchoscopy. Anesthesia inserted a-line given h/o carcinoid syndrome. A Time Out was held and the above information confirmed.   Prior to the date of the procedure a high-resolution CT scan of the chest was performed. Utilizing [place] a virtual tracheobronchial tree was generated to allow the creation of distinct navigation pathways to the patient's parenchymal abnormalities. After being taken to the operating room general anesthesia was initiated and the patient  was orally intubated. The video fiberoptic bronchoscope was introduced via the endotracheal tube and a general inspection was performed which showed moderate clear & white secretions. The extendable working channel and locator guide were introduced into the bronchoscope. The distinct  navigation pathways prepared prior to this procedure were then utilized to navigate to within 1 cm of patient's lesion(s) identified on CT scan. The extendable working channel was secured into place and the locator guide was withdrawn. Under fluoroscopic guidance transbronchial needle brushings and transbronchial forceps biopsies were performed to be sent for cytology and pathology. A bronchioalveolar lavage was performed in the RLL and sent for cytology and microbiology (bacterial, fungal, AFB smears and cultures). At the end of the procedure a general airway inspection was performed and there was no evidence of active bleeding. The bronchoscope was removed.  The patient tolerated the procedure well. There was no significant blood loss and there were no obvious complications. A post-procedural chest x-ray is pending.  Samples: 1. Transbronchial needle brushings from RLL nodule  2. Transbronchial Wang needle biopsies-not done  3. Transbronchial forceps biopsies from RLL nodule  4. Bronchoalveolar lavage from RLL   Plans:  The patient will be discharged from the PACU to home when recovered from anesthesia and after chest x-ray is reviewed. We will review the cytology, pathology and microbiology results with the patient when they become available. Outpatient followup will be with Oncology & me.    [name] MD 06/19/2016

## 2016-06-21 ENCOUNTER — Encounter: Payer: Self-pay | Admitting: Hematology

## 2016-06-21 ENCOUNTER — Other Ambulatory Visit: Payer: Self-pay | Admitting: Internal Medicine

## 2016-06-21 LAB — CULTURE, RESPIRATORY: GRAM STAIN: NONE SEEN

## 2016-06-21 LAB — CULTURE, RESPIRATORY W GRAM STAIN

## 2016-06-21 NOTE — Progress Notes (Signed)
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 No evidence of mediastinal node metastasis or distant metastasis. Groundglass nodule in the right upper lobe is indeterminate 8. No metabolic activity associated with the enhancing hepatic lesion in left and right hepatic lobes, fever up benign etiology or hemangiomas, but malignancy is not ruled out.      05/24/2016 Imaging    Abdomen MRI with and without contrast showed multiple enhancing liver lesions, the largest in the inferior right liver shows central necrosis, suspicious for malignancy.      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       06/11/2016 Procedure    Patient underwent EGD and colonoscopy by Dr. Ardis Hughs, no significant tumor, to the ileum and distal stomach biopsy showed inflammatory change, no evidence of carcinoid. One polyp in ascending colon was removed, showed tubular adenoma.       HISTORY OF PRESENTING ILLNESS (05/15/2016):     Ruben Mccormick 60 y.o. male with past medical history of hepatitis B, hypertension, heavy smoking, is here because of his recent abnormal CT scan, which showed multiple liver lesions concerning for malignancy. He was referred by his primary care physician Dr. Melford Mccormick.    He has had diarrhea for 1.5 years, it has been  getting worse lately, up 20 times a day. He was seen by Dr. Melford Mccormick and started taking Viberzi in 02/2016 which helps a lot. He underwent abdominal ultrasound, which showed a large lesion in the right lobe of the liver. He subsequently underwent abdominal and pelvis CT with contrast, which showed a dominant 7.6 cm necrotic mass in the inferior right lobe, and multiple other small lesions in the liver.   His last colonoscopy was 5 years ago which was normal per patient. He denies significant abdominal pain or discomfort, no melano or hemachizia, but he has mild heart burn and gassy feeling from [employer]. No other symptoms. He has chronic mild dyspnia from smoking. He lost about 25 lbs in the past 4-5 months. 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   He lives with his wife, has two daughters and one son. His previous hepatitis B test showed positive S antigen and E antibody, hepatitis C negative. Patient is on not aware of his prior hepatitis B infection. He did use cocaine, but no IV drugs. No history of blood transfusion.  CURRENT THERAPY: pending somatostatin injection  INTERIM HISTORY  Ruben Mccormick returns for follow-up. He is accompanied by his wife to the clinic today. He underwent EGD and colonoscopy by Dr. Ardis Hughs last week, multiple biopsy was negative for carcinoid tumor. He feels about same, still has moderate diarrhea, no other new complaints.  MEDICAL HISTORY:  Past Medical History:  Diagnosis Date  . History of kidney  stones   . Hyperlipidemia   . Hypertension   . Hypogonadism male   . Pre-diabetes   . Vitamin D deficiency     SURGICAL HISTORY: Past Surgical History:  Procedure Laterality Date  . APPENDECTOMY    . COLONOSCOPY WITH PROPOFOL N/A 06/11/2016   Procedure: COLONOSCOPY WITH PROPOFOL;  Surgeon: Milus Banister, MD;  Location: [facility];  Service: Endoscopy;  Laterality: N/A;  .  ESOPHAGOGASTRODUODENOSCOPY (EGD) WITH PROPOFOL N/A 06/11/2016   Procedure: ESOPHAGOGASTRODUODENOSCOPY (EGD) WITH PROPOFOL;  Surgeon: Milus Banister, MD;  Location: [facility];  Service: Endoscopy;  Laterality: N/A;  . VIDEO BRONCHOSCOPY WITH ENDOBRONCHIAL NAVIGATION Right 06/19/2016   Procedure: VIDEO BRONCHOSCOPY WITH ENDOBRONCHIAL NAVIGATION with RLL brushings, washings and bx;  Surgeon: [name], MD;  Location: [facility];  Service: Thoracic;  Laterality: Right;    SOCIAL HISTORY: Social History   Social History  . Marital status: Married    Spouse name: N/A  . Number of children: N/A  . Years of education: N/A   Occupational History  . Not on file.   Social History Main Topics  . Smoking status: Former Smoker    Packs/day: 1.00    Years: 40.00    Types: Cigarettes    Quit date: 09/11/2015  . Smokeless tobacco: Never Used  . Alcohol use 16.8 oz/week    14 Shots of liquor, 14 Standard drinks or equivalent per week     Comment: 2 drinks daily since young   . Drug use: No     Comment: cocaine in the past, no iv drugs   . Sexual activity: Not on file   Other Topics Concern  . Not on file   Social History Narrative  . No narrative on file    FAMILY HISTORY: Family History  Problem Relation Age of Onset  . Diabetes Father   . Cancer Father     mesothelioma   . Alzheimer's disease Mother   . Parkinson's disease Mother   . Cancer Paternal Grandfather     colon    ALLERGIES:  is allergic to bee venom; ppd [tuberculin purified protein derivative]; and voltaren [diclofenac sodium].  MEDICATIONS:  Current Outpatient Prescriptions  Medication Sig Dispense Refill  . aspirin 325 MG tablet Take 325 mg by mouth daily.    . B-D 3CC LUER-LOK SYR 21GX1" 21G X 1" 3 ML MISC USE AS DIRECTED FOR TESTOSTERONE 10 each 0  . Cholecalciferol (VITAMIN D-3) 5000 UNITS TABS Take 5,000 Units by mouth daily.     . Eluxadoline (VIBERZI) 100 MG TABS Take 1 tablet by mouth 2 (two) times  daily. (Patient taking differently: Take 1 tablet by mouth daily. ) 180 tablet 0  . fluticasone (FLONASE) 50 MCG/ACT nasal spray INSTILL 2 SPRAYS INTO THE NOSTRILS AT NIGHT AS NEEDED FOR CONGESTION  5  . hydrochlorothiazide (HYDRODIURIL) 25 MG tablet Take 1 tablet (25 mg total) by mouth daily. (Patient not taking: Reported on 06/17/2016) 90 tablet 0  . PREVIDENT 5000 BOOSTER PLUS 1.1 % PSTE Take 1 application by mouth daily.   3  . testosterone cypionate (DEPOTESTOSTERONE CYPIONATE) 200 MG/ML injection INJECT 2 ML INTO THE MUSCLE EVERY 2 WEEKS 10 mL 5  . Triamcinolone Acetonide (TRIAMCINOLONE 0.1 % CREAM : EUCERIN) CREA Apply 1 application topically 2 (two) times daily. 1 each 11  . valACYclovir (VALTREX) 500 MG tablet TAKE 1 TABLET BY MOUTH TWICE DAILY AS NEEDED FOR FEVER BLISTER 90 tablet 1  . Multiple Vitamin (MULTIVITAMIN WITH MINERALS)  TABS tablet Take 1 tablet by mouth daily.     No current facility-administered medications for this visit.     REVIEW OF SYSTEMS:   Constitutional: Denies fevers, chills or abnormal night sweats,  (+) weight loss  Eyes: Denies blurriness of vision, double vision or watery eyes Ears, nose, mouth, throat, and face: Denies mucositis or sore throat Respiratory: Denies cough, dyspnea or wheezes Cardiovascular: Denies palpitation, chest discomfort or lower extremity swelling Gastrointestinal:  Denies nausea, heartburn or change in bowel habits Skin: Denies abnormal skin rashes Lymphatics: Denies new lymphadenopathy or easy bruising Neurological:Denies numbness, tingling or new weaknesses Behavioral/Psych: Mood is stable, no new changes  All other systems were reviewed with the patient and are negative.  PHYSICAL EXAMINATION: ECOG PERFORMANCE STATUS: 1 - Symptomatic but completely ambulatory  Vitals:   06/15/16 1629  BP: 137/89  Pulse: 86  Resp: 18  Temp: 97.9 F (36.6 C)   Filed Weights   06/15/16 1629  Weight: 146 lb 4.8 oz (66.4 kg)     GENERAL:alert, no distress and comfortable SKIN: skin color, texture, turgor are normal, no rashes or significant lesions EYES: normal, conjunctiva are pink and non-injected, sclera clear OROPHARYNX:no exudate, no erythema and lips, buccal mucosa, and tongue normal  NECK: supple, thyroid normal size, non-tender, without nodularity LYMPH:  no palpable lymphadenopathy in the cervical, axillary or inguinal LUNGS: clear to auscultation and percussion with normal breathing effort HEART: regular rate & rhythm and no murmurs and no lower extremity edema ABDOMEN:abdomen soft, non-tender and normal bowel sounds, no organomegaly. Rectal exam was negative for palpable mass, no blood on the tip of gloves   Musculoskeletal:no cyanosis of digits and no clubbing  PSYCH: alert & oriented x 3 with fluent speech NEURO: no focal motor/sensory deficits  LABORATORY DATA:  I have reviewed the data as listed CBC Latest Ref Rng & Units 06/19/2016 06/19/2016 06/08/2016  WBC 4.0 - 10.5 K/uL - - 5.8  Hemoglobin 13.0 - 17.0 g/dL 15.6 13.6 15.8  Hematocrit 39.0 - 52.0 % 46.0 40.0 49.5  Platelets 150 - 400 K/uL - - 188   CMP Latest Ref Rng & Units 06/19/2016 06/19/2016 06/08/2016  Glucose 65 - 99 mg/dL - - 88  BUN 6 - 20 mg/dL - - 9  Creatinine 0.61 - 1.24 mg/dL - - 1.02  Sodium 135 - 145 mmol/L 138 140 140  Potassium 3.5 - 5.1 mmol/L 4.2 3.6 4.3  Chloride 101 - 111 mmol/L - - 101  CO2 22 - 32 mmol/L - - 30  Calcium 8.9 - 10.3 mg/dL - - 9.3  Total Protein 6.5 - 8.1 g/dL - - 6.5  Total Bilirubin 0.3 - 1.2 mg/dL - - 0.8  Alkaline Phos 38 - 126 U/L - - 222(H)  AST 15 - 41 U/L - - 47(H)  ALT 17 - 63 U/L - - 32   PATHOLOGY REPORT  Diagnosis 06/08/2016 Liver, biopsy, Right hepatic lobe - METASTATIC LOW GRADE NEUROENDOCRINE TUMOR (CARCINOID TUMOR). - SEE COMMENT. Microscopic Comment To help phenotype the tumor, immunohistochemical stains were performed. The tumor cells are positive for synaptophysin,  CD56, chromogranin, and NSE. They are negative for arginase, cytokeratin 5/6, cytokeratin 7, Glypican, Hepar-1, and TTF-1. The findings are consistent with metastatic low grade neuroendocrine tumor. Additional studies can be performed upon clinician request. Dr. Burr Medico was paged on 06/10/16. Dr. Saralyn Pilar has reviewed the case and concurs with this interpretation. (JBK:gt, 06/10/16)  Diagnosis 06/11/2016 1. Ileum, biopsy, terminal - INFLAMMATORY PROLAPSE TYPE POLYP. -  NO DYSPLASIA OR MALIGNANCY. 2. Colon, polyp(s), ascending - TUBULAR ADENOMA (X3 FRAGMENTS). - NO HIGH GRADE DYSPLASIA OR MALIGNANCY. 3. Stomach, biopsy, distal - CHRONIC ACTIVE GASTRITIS. - NEGATIVE FOR HELICOBACTER PYLORI. - NO INTESTINAL METAPLASIA, DYSPLASIA, OR MALIGNANCY. - NO EVIDENCE OF CARCINOID. Microscopic Comment 3. A Warthin-Starry stain is performed to determine the possibility of the presence of Helicobacter pylori. The Warthin-Starry stain is negative for organisms of Helicobacter pylori.   RADIOGRAPHIC STUDIES: I have personally reviewed the radiological images as listed and agreed with the findings in the report. Mr Abdomen W Wo Contrast  Result Date: 05/24/2016 CLINICAL DATA:  Liver lesions on previous imaging. EXAM: MRI ABDOMEN WITHOUT AND WITH CONTRAST TECHNIQUE: Multiplanar multisequence MR imaging of the abdomen was performed both before and after the administration of intravenous contrast. CONTRAST:  85m MULTIHANCE GADOBENATE DIMEGLUMINE 529 MG/ML IV SOLN COMPARISON:  PET-CT 05/15/2016.  Abdomen and pelvis CT 05/01/2016. FINDINGS: Lower chest: Unremarkable. Hepatobiliary: As noted on previous studies, the patient has numerous liver lesions of varying sizes. Dominant lesion is inferior right liver measuring 6.5 x 8.6 cm and showing thick irregular enhancing rim with central necrosis. Numerous other scattered lesions range in size from 10 mm up to 3.1 cm. These lesions do not image characteristically for  cavernous hemangioma. Liver parenchyma enhances heterogeneously. Gallbladder is nondistended No intrahepatic or extrahepatic biliary dilation. Pancreas: No focal mass lesion. No dilatation of the main duct. No intraparenchymal cyst. No peripancreatic edema. Spleen:  No splenomegaly. No focal mass lesion. Adrenals/Urinary Tract: No adrenal nodule or mass. Kidneys are unremarkable. Stomach/Bowel: Stomach is nondistended. No gastric wall thickening. No evidence of outlet obstruction. Duodenum is normally positioned as is the ligament of Treitz. No small bowel or colonic dilatation within the visualized abdomen. Vascular/Lymphatic: No abdominal aortic aneurysm. No abdominal lymphadenopathy. Other:  Small volume ascites noted. Musculoskeletal: No abnormal marrow signal within the visualized bony anatomy. IMPRESSION: Multiple enhancing liver lesions, the largest of which in the inferior right liver shows central necrosis. These lesions did not appear hypermetabolic on PET-CT, but MR imaging features are not consistent with cavernous hemangioma or cysts. Tissue sampling may be indicated to exclude malignancy. Electronically Signed   By: EMisty StanleyM.D.   On: 05/24/2016 14:10   [place]  Result Date: 06/08/2016 INDICATION: 60year old with liver lesions and needs a tissue diagnosis. Patient also has a suspicious right lung nodule. Paracentesis was performed immediately prior to the biopsy. EXAM: ULTRASOUND-GUIDED CORE BIOPSY OF LIVER LESION MEDICATIONS: None. ANESTHESIA/SEDATION: Moderate (conscious) sedation was employed during this procedure. A total of Versed 2.0 mg and Fentanyl 100 mcg was administered intravenously. Moderate Sedation Time: 13 minutes. The patient's level of consciousness and vital signs were monitored continuously by radiology nursing throughout the procedure under my direct supervision. FLUOROSCOPY TIME:  None COMPLICATIONS: None immediate. PROCEDURE: Informed written consent was obtained  from the patient after a thorough discussion of the procedural risks, benefits and alternatives. All questions were addressed. A timeout was performed prior to the initiation of the procedure. Right side of the abdomen was prepped with chlorhexidine and sterile field was created. Liver lesion was identified in the inferior right hepatic lobe. The right subcostal area was anesthetized with 1% lidocaine. Using ultrasound guidance, 17 gauge needle was directed into the right hepatic lobe and into the lesion. Three core biopsies obtained with an 18 gauge core device. Gel-Foam plugs were injected through the 17 gauge needle for hemostasis. 17 gauge needle removed without complication. Bandage placed over the puncture  site. FINDINGS: Heterogeneous large lesion involving the inferior right hepatic lobe. Only a trace amount of ascites was remaining following the paracentesis. Needle position confirmed within the lesion. Multiple echogenic foci in the right hepatic lobe following the core biopsies related to the injection of Gel-Foam. IMPRESSION: Successful ultrasound-guided core biopsy of a large right hepatic lesion. Electronically Signed   By: Markus Daft M.D.   On: 06/08/2016 16:30   US Paracentesis  Result Date: 06/08/2016 INDICATION: 60 year old with liver lesions and scheduled for percutaneous liver biopsy. Plan for paracentesis prior to biopsy. EXAM: ULTRASOUND GUIDED PARACENTESIS MEDICATIONS: None. COMPLICATIONS: None immediate. PROCEDURE: Informed written consent was obtained from the patient after a discussion of the risks, benefits and alternatives to treatment. A timeout was performed prior to the initiation of the procedure. Initial ultrasound scanning demonstrates a small amount of ascites within the right lateral abdomen and perihepatic region. The right lateral abdomen was prepped and draped in the usual sterile fashion. 1% lidocaine was used for local anesthesia. Following this, a 19 gauge, 7-cm, Yueh  catheter was introduced. An ultrasound image was saved for documentation purposes. The paracentesis was performed. The catheter was removed and a dressing was applied. The patient tolerated the procedure well without immediate post procedural complication. FINDINGS: A total of approximately 550 mL of yellow fluid was removed. Fluid was sent for cytology. IMPRESSION: Successful ultrasound-guided paracentesis yielding 550 mL of peritoneal fluid. Electronically Signed   By: Markus Daft M.D.   On: 06/08/2016 17:40   Dg Chest Portable 1 View  Result Date: 06/19/2016 CLINICAL DATA:  Respiratory decompensation falling bronchoscopy. The heart, hila, and mediastinum are normal. EXAM: PORTABLE CHEST 1 VIEW COMPARISON:  December 05, 2015 FINDINGS: Mild opacity in the right apex may be from recent bronchoscopy. No pneumothorax after bronchoscopy. A skin fold is seen over the lateral right lower chest. The patient appears to have an ETT which terminates in the mid trachea. No other interval changes or acute abnormalities. IMPRESSION: Appropriate placement of ET tube. No pneumothorax after bronchoscopy. Electronically Signed   By: Dorise Bullion III M.D   On: 06/19/2016 19:27   Ct Super D Chest Wo Contrast  Result Date: 06/05/2016 CLINICAL DATA:  Right lower lobe pulmonary nodule. Planning for bronchoscopy. Super D protocol. EXAM: CT CHEST WITHOUT CONTRAST TECHNIQUE: Multidetector CT imaging of the chest was performed using thin slice collimation for electromagnetic bronchoscopy planning purposes, without intravenous contrast. COMPARISON:  PET-CT 05/15/2016.  Abdominal CT 05/01/2016. FINDINGS: Cardiovascular: There is atherosclerosis of the aorta, great vessels and coronary arteries. No acute vascular findings are seen on noncontrast imaging. The heart size is normal. There is no pericardial effusion. Mediastinum/Nodes: There are no enlarged mediastinal, hilar or axillary lymph nodes.Small axillary lymph nodes are present,  not hypermetabolic on PET-CT. The thyroid gland, trachea and esophagus demonstrate no significant findings. Lungs/Pleura: There is no pleural effusion. Moderate centrilobular emphysema. The dominant spiculated right lower lobe nodule measures 11 x 11 mm on image 43. This is unchanged from the recent studies as remeasured. There are additional scattered small pulmonary nodules, mostly subpleural in location. There are also ground-glass densities in both upper lobes, stable from recent PET-CT. Upper abdomen: There is contour irregularity of the liver and ascites. The liver lesions seen on recent abdominal CT are not well visualized on this noncontrast study. No evidence of adrenal mass. Musculoskeletal/Chest wall: There is no chest wall mass or suspicious osseous finding. There are degenerative changes within the thoracic spine. There is fatty atrophy  of the right supraspinatus and infraspinatus muscles. There is subcutaneous lipoma superiorly in the upper left chest, measuring up to 3.7 cm on image 9. IMPRESSION: 1. Imaging for electro magnetic bronchoscopy planning purposes. The dominant spiculated right lower lobe lesion is unchanged, hypermetabolic on recent PET-CT and remaining worrisome for bronchogenic carcinoma. 2. No other focally suspicious nodules. There is emphysema with scattered scarring and upper lobe ground-glass densities. Follow-up will depend on the etiology of the dominant right lower lobe lesion, but CT follow-up in at least 6 months suggested. This recommendation follows the consensus statement: Guidelines for Management of Incidental Pulmonary Nodules Detected on CT Images: From the Fleischner Society 2017; Radiology 2017; 284:228-243. 3. No adenopathy. 4. Atherosclerosis. Electronically Signed   By: Richardean Sale M.D.   On: 06/05/2016 10:47   EGD 06/11/2016 - Normal esophagus. - Normal examined duodenum. - Abnromal distal stomach, this was somewhat neoplastic appearing, biospied  extensively. - The examination was otherwise normal.  Colonoscopy 06/11/2016: One abnormal appearing ileal polypoid leion in the terminal ileum. Biopsied. - Two 8 to 11 mm polyps in the transverse colon and in the ascending colon, removed with a hot snare. Resected and retrieved. - One 5 mm polyp in the transverse colon, removed with a cold snare. Resected and retrieved. - Diverticulosis in the left colon.  ECHO 06/05/2016 - Left ventricle: The cavity size was normal. Left ventricular   diastolic function parameters were normal. - Ventricular septum: The contour showed diastolic flattening.   These changes are consistent with RV volume overload. - Right ventricle: The cavity size was severely dilated. Systolic   function was mildly reduced. - Right atrium: The atrium was severely dilated. - Tricuspid valve: Mobility was restricted. There was malcoaptation   of the valve leaflets. There was wide-open regurgitation.  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  ASSESSMENT & PLAN:  60 year old male with past medical history of hypertension, hepatitis B infection, presented with worsening diarrhea for one and half years, fatigue, anorexia and weight loss.  1. 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 No significant hilar or mediastinal lymphadenopathy. This additional small groundglass nodule in the right upper lobe, indeterminate. I reviewed the imaging findings with patient -However his multiple liver lesions are not hypermetabolic on the PET scan, could be a secondary primary, versus benign lesions. This was further evaluated by liver MRI, which showed multiple enhancing liver lesions, suspicious for malignancy. -I  discussed his liver biopsy results, which showed metastatic carcinoid tumor, low-grade -His EGD showed abnormal appearing of the distal stomach, however the biopsy only showed inflammatory change. Colonoscopy was also an negative except polyps, turbinate and biopsy was negative for carcinoid tumor. -If his right lung nodule biopsy did not show carcinoid tumor, I recommend him to consider [place] DOTATATE NET PET scan to identify his primary tumpor, which was recently approved and we are in the process to start using it in our radiology department, he agrees.  -We discussed the nature history of metastatic carcinoid tumor. Due to his diffuse liver metastasis, I do not think he is a candidate for upfront metastasectomy, and his disease is likely incurable. We discussed this is a indolent disease, and most people do well with treatment -Due to his significantly carcinoid syndrome, I  recommend him to start Sandostatin injection in a week, this will likely helps his diarrhea, hopefully will slow down or improve his cardiac damage from carcinoid syndrome   2. 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 Chronic diarrhea, low appetite and weight loss -He was seen by about GI, scheduled to have colonoscopy in 2-3 weeks  3. Right lung nodule, likely malignancy  -His right upper lobe nodule is intensely hypermetabolic on PET scan, no significant adenopathy or other hypermetabolic lesion suggesting metastasis. This is highly suspicious for primary lung cancer -I have contacted Dr. Elsworth Soho, and suggest patient to undergo bronchoscopy and biopsy, for tissue diagnosis -pt is agreeable with the biopsy  Plan -Start Sandostatin injection next week, with '20mg'$  first, if  tolerates well, will increased to '30mg'$  from second cycle, and continue every 4 weeks -will check serum chromogranin A, and a 24-hour urine 5 HIAA before first dose Sandostatin, and a follow-up every 1-3 months -pt is scheduled to see Dr. Elsworth Soho back, for bronchoscopy and right lung mass biopsy -I will see him back in 2-3 weeks, after his lung biopsy    All questions were answered. The patient knows to call the clinic with any problems, questions or concerns. I spent 25 minutes counseling the patient face to face. The total time spent in the appointment was 30 minutes and more than 50% was on counseling.     Truitt Merle, MD 06/21/2016

## 2016-06-22 ENCOUNTER — Other Ambulatory Visit: Payer: Self-pay | Admitting: Hematology

## 2016-06-22 ENCOUNTER — Ambulatory Visit (HOSPITAL_BASED_OUTPATIENT_CLINIC_OR_DEPARTMENT_OTHER): Payer: Commercial Managed Care - PPO

## 2016-06-22 ENCOUNTER — Other Ambulatory Visit (HOSPITAL_BASED_OUTPATIENT_CLINIC_OR_DEPARTMENT_OTHER): Payer: Commercial Managed Care - PPO

## 2016-06-22 VITALS — BP 133/86 | HR 93 | Temp 98.3°F | Resp 20

## 2016-06-22 DIAGNOSIS — C7B02 Secondary carcinoid tumors of liver: Secondary | ICD-10-CM

## 2016-06-22 DIAGNOSIS — C7A00 Malignant carcinoid tumor of unspecified site: Secondary | ICD-10-CM

## 2016-06-22 LAB — COMPREHENSIVE METABOLIC PANEL
ALBUMIN: 3 g/dL — AB (ref 3.5–5.0)
ALK PHOS: 255 U/L — AB (ref 40–150)
ALT: 24 U/L (ref 0–55)
ANION GAP: 9 meq/L (ref 3–11)
AST: 27 U/L (ref 5–34)
BILIRUBIN TOTAL: 0.72 mg/dL (ref 0.20–1.20)
BUN: 10 mg/dL (ref 7.0–26.0)
CO2: 29 meq/L (ref 22–29)
CREATININE: 0.8 mg/dL (ref 0.7–1.3)
Calcium: 9.2 mg/dL (ref 8.4–10.4)
Chloride: 101 mEq/L (ref 98–109)
Glucose: 167 mg/dl — ABNORMAL HIGH (ref 70–140)
Potassium: 4.6 mEq/L (ref 3.5–5.1)
Sodium: 140 mEq/L (ref 136–145)
TOTAL PROTEIN: 6.2 g/dL — AB (ref 6.4–8.3)

## 2016-06-22 LAB — CBC WITH DIFFERENTIAL/PLATELET
BASO%: 0.9 % (ref 0.0–2.0)
Basophils Absolute: 0.1 10*3/uL (ref 0.0–0.1)
EOS ABS: 0.3 10*3/uL (ref 0.0–0.5)
EOS%: 4.7 % (ref 0.0–7.0)
HEMATOCRIT: 46.8 % (ref 38.4–49.9)
HEMOGLOBIN: 14.8 g/dL (ref 13.0–17.1)
LYMPH#: 1.1 10*3/uL (ref 0.9–3.3)
LYMPH%: 16.9 % (ref 14.0–49.0)
MCH: 29.4 pg (ref 27.2–33.4)
MCHC: 31.6 g/dL — ABNORMAL LOW (ref 32.0–36.0)
MCV: 93 fL (ref 79.3–98.0)
MONO#: 0.5 10*3/uL (ref 0.1–0.9)
MONO%: 8.1 % (ref 0.0–14.0)
NEUT%: 69.4 % (ref 39.0–75.0)
NEUTROS ABS: 4.4 10*3/uL (ref 1.5–6.5)
PLATELETS: 206 10*3/uL (ref 140–400)
RBC: 5.03 10*6/uL (ref 4.20–5.82)
RDW: 18.1 % — AB (ref 11.0–14.6)
WBC: 6.3 10*3/uL (ref 4.0–10.3)

## 2016-06-22 MED ORDER — OCTREOTIDE ACETATE 20 MG IM KIT
20.0000 mg | PACK | Freq: Once | INTRAMUSCULAR | Status: AC
Start: 2016-06-22 — End: 2016-06-22
  Administered 2016-06-22: 20 mg via INTRAMUSCULAR
  Filled 2016-06-22: qty 1

## 2016-06-22 NOTE — Patient Instructions (Signed)

## 2016-06-23 ENCOUNTER — Other Ambulatory Visit (HOSPITAL_COMMUNITY): Payer: Self-pay | Admitting: Neurology

## 2016-06-24 ENCOUNTER — Other Ambulatory Visit: Payer: Self-pay | Admitting: Hematology

## 2016-06-24 LAB — CHROMOGRANIN A: CHROMOGRAN A: 301 nmol/L — AB (ref 0–5)

## 2016-06-25 ENCOUNTER — Telehealth: Payer: Self-pay | Admitting: Pulmonary Disease

## 2016-06-25 ENCOUNTER — Telehealth: Payer: Self-pay | Admitting: Hematology

## 2016-06-25 ENCOUNTER — Other Ambulatory Visit: Payer: Self-pay | Admitting: Hematology

## 2016-06-25 DIAGNOSIS — C7B02 Secondary carcinoid tumors of liver: Secondary | ICD-10-CM

## 2016-06-25 MED ORDER — LEVOFLOXACIN 500 MG PO TABS: 500.0000 mg | ORAL_TABLET | Freq: Every day | ORAL | 0 refills | Status: DC

## 2016-06-25 NOTE — Telephone Encounter (Signed)
Pt states RA told him if not feeling better after biopsy last Friday to let him know and RA would give RX. Pt is having chest congestion and coughing up green phlegm. Pt would like abx sent to [facility].  RA Please advise. Thanks.

## 2016-06-25 NOTE — Telephone Encounter (Signed)
Spoke with pt, aware of RA's recs. rx sent to preferred pharmacy.  Nothing further needed.

## 2016-06-25 NOTE — Telephone Encounter (Signed)
levaquin 500 mg daily x 7 days 

## 2016-06-25 NOTE — Telephone Encounter (Signed)
Patient underwent bronchoscopy and a right lung mass biopsy, both surgical pathology and cytology were negative for malignancy. PET scan was reviewed in thoracic tumor conference and our GI tumor conference, consensus is percutaneous needle biopsy of the right lung mass by interventional radiology, due to the high suspicion of primary lung cancer. Dr. Ronny Bacon agrees. I spoke pt this morning, he agrees, I will arrange. I'll see him after his biopsy.   Truitt Merle  06/25/2016   .

## 2016-06-26 ENCOUNTER — Ambulatory Visit: Payer: Commercial Managed Care - PPO | Admitting: Pulmonary Disease

## 2016-07-03 ENCOUNTER — Telehealth: Payer: Self-pay | Admitting: Hematology

## 2016-07-03 NOTE — Telephone Encounter (Signed)
Called pt to inform him of r/s office visit to 12/5 due to biopsy being on 12/1  per Dr. Christen Butter. Pt will call back to confirm appt.

## 2016-07-06 ENCOUNTER — Ambulatory Visit: Payer: Commercial Managed Care - PPO | Admitting: Hematology

## 2016-07-07 LAB — 5 HIAA, QUANTITATIVE, URINE, 24 HOUR
5-HIAA, Urine: 132 mg/L
5-HIAA,Quant.,24 Hr Urine: 198 mg/24 hr — ABNORMAL HIGH (ref 0.0–14.9)

## 2016-07-09 ENCOUNTER — Other Ambulatory Visit: Payer: Self-pay | Admitting: Radiology

## 2016-07-10 ENCOUNTER — Other Ambulatory Visit: Payer: Self-pay | Admitting: Student

## 2016-07-10 ENCOUNTER — Ambulatory Visit (HOSPITAL_COMMUNITY)
Admission: RE | Admit: 2016-07-10 | Discharge: 2016-07-10 | Disposition: A | Discharge status: Home / Self Care | Payer: Commercial Managed Care - PPO | Source: Ambulatory Visit | Attending: Hematology | Admitting: Hematology

## 2016-07-10 ENCOUNTER — Ambulatory Visit (HOSPITAL_COMMUNITY)
Admission: RE | Admit: 2016-07-10 | Discharge: 2016-07-10 | Disposition: A | Discharge status: Home / Self Care | Payer: Commercial Managed Care - PPO | Source: Ambulatory Visit | Attending: Interventional Radiology | Admitting: Interventional Radiology

## 2016-07-10 DIAGNOSIS — E785 Hyperlipidemia, unspecified: Secondary | ICD-10-CM | POA: Insufficient documentation

## 2016-07-10 DIAGNOSIS — Z7951 Long term (current) use of inhaled steroids: Secondary | ICD-10-CM | POA: Insufficient documentation

## 2016-07-10 DIAGNOSIS — Z7982 Long term (current) use of aspirin: Secondary | ICD-10-CM | POA: Diagnosis not present

## 2016-07-10 DIAGNOSIS — Z6823 Body mass index (BMI) 23.0-23.9, adult: Secondary | ICD-10-CM | POA: Insufficient documentation

## 2016-07-10 DIAGNOSIS — Z87442 Personal history of urinary calculi: Secondary | ICD-10-CM | POA: Diagnosis not present

## 2016-07-10 DIAGNOSIS — R634 Abnormal weight loss: Secondary | ICD-10-CM | POA: Diagnosis not present

## 2016-07-10 DIAGNOSIS — E291 Testicular hypofunction: Secondary | ICD-10-CM | POA: Diagnosis not present

## 2016-07-10 DIAGNOSIS — Z79899 Other long term (current) drug therapy: Secondary | ICD-10-CM | POA: Diagnosis not present

## 2016-07-10 DIAGNOSIS — E559 Vitamin D deficiency, unspecified: Secondary | ICD-10-CM | POA: Diagnosis not present

## 2016-07-10 DIAGNOSIS — Z888 Allergy status to other drugs, medicaments and biological substances status: Secondary | ICD-10-CM | POA: Diagnosis not present

## 2016-07-10 DIAGNOSIS — C7A00 Malignant carcinoid tumor of unspecified site: Secondary | ICD-10-CM | POA: Insufficient documentation

## 2016-07-10 DIAGNOSIS — J95811 Postprocedural pneumothorax: Secondary | ICD-10-CM

## 2016-07-10 DIAGNOSIS — C3431 Malignant neoplasm of lower lobe, right bronchus or lung: Secondary | ICD-10-CM | POA: Insufficient documentation

## 2016-07-10 DIAGNOSIS — I10 Essential (primary) hypertension: Secondary | ICD-10-CM | POA: Diagnosis not present

## 2016-07-10 DIAGNOSIS — R7303 Prediabetes: Secondary | ICD-10-CM | POA: Diagnosis not present

## 2016-07-10 DIAGNOSIS — F17200 Nicotine dependence, unspecified, uncomplicated: Secondary | ICD-10-CM | POA: Diagnosis not present

## 2016-07-10 DIAGNOSIS — R918 Other nonspecific abnormal finding of lung field: Secondary | ICD-10-CM | POA: Diagnosis present

## 2016-07-10 DIAGNOSIS — C7B02 Secondary carcinoid tumors of liver: Secondary | ICD-10-CM

## 2016-07-10 LAB — CBC
HCT: 46.6 % (ref 39.0–52.0)
HEMOGLOBIN: 14.7 g/dL (ref 13.0–17.0)
MCH: 30 pg (ref 26.0–34.0)
MCHC: 31.5 g/dL (ref 30.0–36.0)
MCV: 95.1 fL (ref 78.0–100.0)
Platelets: 183 10*3/uL (ref 150–400)
RBC: 4.9 MIL/uL (ref 4.22–5.81)
RDW: 18.2 % — AB (ref 11.5–15.5)
WBC: 5.3 10*3/uL (ref 4.0–10.5)

## 2016-07-10 LAB — GLUCOSE, CAPILLARY: Glucose-Capillary: 102 mg/dL — ABNORMAL HIGH (ref 65–99)

## 2016-07-10 LAB — APTT: aPTT: 30 seconds (ref 24–36)

## 2016-07-10 LAB — PROTIME-INR
INR: 1.12
PROTHROMBIN TIME: 14.5 s (ref 11.4–15.2)

## 2016-07-10 MED ORDER — MIDAZOLAM HCL 2 MG/2ML IJ SOLN
INTRAMUSCULAR | Status: AC | PRN
  Administered 2016-07-10: 1 mg via INTRAVENOUS

## 2016-07-10 MED ORDER — FENTANYL CITRATE (PF) 100 MCG/2ML IJ SOLN
INTRAMUSCULAR | Status: AC | PRN
  Administered 2016-07-10: 50 ug via INTRAVENOUS

## 2016-07-10 MED ORDER — SODIUM CHLORIDE 0.9 % IV SOLN: INTRAVENOUS | Status: DC

## 2016-07-10 MED ORDER — FENTANYL CITRATE (PF) 100 MCG/2ML IJ SOLN
INTRAMUSCULAR | Status: AC
  Filled 2016-07-10: qty 4

## 2016-07-10 MED ORDER — LIDOCAINE HCL 1 % IJ SOLN
INTRAMUSCULAR | Status: AC
  Filled 2016-07-10: qty 20

## 2016-07-10 MED ORDER — MIDAZOLAM HCL 2 MG/2ML IJ SOLN
INTRAMUSCULAR | Status: AC
  Filled 2016-07-10: qty 4

## 2016-07-10 MED ORDER — SODIUM CHLORIDE 0.9 % IV SOLN
INTRAVENOUS | Status: AC | PRN
  Administered 2016-07-10: 10 mL/h via INTRAVENOUS

## 2016-07-10 NOTE — H&P (Signed)
Referring Physician(s): Feng,Yan  Supervising Physician: Corrie Mckusick  Patient Status:   [facility] OP  Chief Complaint:  "I'm having a lung biopsy"  Subjective: Patient familiar to IR service from recent liver lesion biopsy as well as paracentesis on 06/08/16. Pathology revealed carcinoid tumor. He is status post bronchoscopic right lower lobe lung mass biopsy on 06/19/16 which was negative for malignancy. PET scan performed last month revealed intense metabolic activity within the right lower lobe lung nodule. He presents again today for CT-guided biopsy of the right lower lobe lung mass for further evaluation. He continues to smoke. He currently denies fever, headache, chest pain, dyspnea, abdominal/back pain, nausea, vomiting or abnormal bleeding. He has had occasional cough, and weight loss as well as intermittent flushing and diarrhea. Additional medical history as below.  Past Medical History:  Diagnosis Date  . History of kidney stones   . Hyperlipidemia   . Hypertension   . Hypogonadism male   . Pre-diabetes   . Vitamin D deficiency    Past Surgical History:  Procedure Laterality Date  . APPENDECTOMY    . COLONOSCOPY WITH PROPOFOL N/A 06/11/2016   Procedure: COLONOSCOPY WITH PROPOFOL;  Surgeon: Milus Banister, MD;  Location: [facility];  Service: Endoscopy;  Laterality: N/A;  . ESOPHAGOGASTRODUODENOSCOPY (EGD) WITH PROPOFOL N/A 06/11/2016   Procedure: ESOPHAGOGASTRODUODENOSCOPY (EGD) WITH PROPOFOL;  Surgeon: Milus Banister, MD;  Location: [facility];  Service: Endoscopy;  Laterality: N/A;  . VIDEO BRONCHOSCOPY WITH ENDOBRONCHIAL NAVIGATION Right 06/19/2016   Procedure: VIDEO BRONCHOSCOPY WITH ENDOBRONCHIAL NAVIGATION with RLL brushings, washings and bx;  Surgeon: [name], MD;  Location: [place];  Service: Thoracic;  Laterality: Right;      Allergies: Bee venom; Ppd [tuberculin purified protein derivative]; and Voltaren [diclofenac sodium]  Medications: Prior to  Admission medications   Medication Sig Start Date End Date Taking? Authorizing Provider  aspirin 325 MG tablet Take 325 mg by mouth daily.   Yes Historical Provider, MD  Aspirin-Acetaminophen-Caffeine (GOODY HEADACHE PO) Take 1 packet by mouth daily as needed (headaches).   Yes Historical Provider, MD  Cholecalciferol (VITAMIN D-3) 5000 UNITS TABS Take 5,000 Units by mouth daily.    Yes Historical Provider, MD  Eluxadoline (VIBERZI) 100 MG TABS Take 1 tablet by mouth 2 (two) times daily. Patient taking differently: Take 1 tablet by mouth daily.  03/06/16  Yes Courtney Forcucci, PA-C  fluticasone (FLONASE) 50 MCG/ACT nasal spray INSTILL 2 SPRAYS INTO THE NOSTRILS AT NIGHT AS NEEDED FOR CONGESTION 12/19/15  Yes Historical Provider, MD  Multiple Vitamin (MULTIVITAMIN WITH MINERALS) TABS tablet Take 1 tablet by mouth daily.   Yes Historical Provider, MD  PREVIDENT 5000 BOOSTER PLUS 1.1 % PSTE Take 1 application by mouth daily.  11/11/15  Yes Historical Provider, MD  Triamcinolone Acetonide (TRIAMCINOLONE 0.1 % CREAM : EUCERIN) CREA Apply 1 application topically 2 (two) times daily. 07/16/14  Yes Unk Pinto, MD  B-D 3CC LUER-LOK SYR 21GX1" 21G X 1" 3 ML MISC USE AS DIRECTED FOR TESTOSTERONE 06/04/14   Vicie Mutters, PA-C  hydrochlorothiazide (HYDRODIURIL) 25 MG tablet Take 1 tablet (25 mg total) by mouth daily. Patient not taking: Reported on 07/09/2016 03/06/16   Loma Sousa Forcucci, PA-C  levofloxacin (LEVAQUIN) 500 MG tablet Take 1 tablet (500 mg total) by mouth daily. Patient not taking: Reported on 07/09/2016 06/25/16   [name], MD  testosterone cypionate (DEPOTESTOSTERONE CYPIONATE) 200 MG/ML injection INJECT 2 ML INTO THE MUSCLE EVERY 2 WEEKS Patient not taking: Reported  on 07/09/2016 09/08/15   Unk Pinto, MD  valACYclovir (VALTREX) 500 MG tablet TAKE 1 TABLET BY MOUTH TWICE DAILY AS NEEDED FOR FEVER BLISTER 06/21/15   Unk Pinto, MD     Vital Signs: BP (!) 150/110   Pulse 76    Temp 97.7 F (36.5 C) (Oral)   Resp 20   Ht '5\' 7"'$  (1.702 m)   Wt 150 lb (68 kg)   SpO2 99%   BMI 23.49 kg/m   Physical Exam patient awake, alert. Chest with distant breath sounds bilaterally. Heart with regular rate and rhythm. Abdomen soft, positive bowel sounds, mild epigastric tenderness to palpation; lower extremities with no edema; he has flushed appearance to face and neck region.  Imaging: No results found.  Labs:  CBC:  Recent Labs  05/26/16 1540 06/08/16 1209 06/19/16 1731 06/19/16 1824 06/22/16 0938 07/10/16 0604  WBC 6.7 5.8  --   --  6.3 5.3  HGB 15.1 15.8 13.6 15.6 14.8 14.7  HCT 46.4 49.5 40.0 46.0 46.8 46.6  PLT 177.0 188  --   --  206 183    COAGS:  Recent Labs  05/26/16 1540 06/08/16 1209 07/10/16 0604  INR 1.2* 1.08 1.12  APTT  --   --  30    BMP:  Recent Labs  12/05/15 1555 03/06/16 1012 05/24/16 1036 05/26/16 1540 06/08/16 1209 06/19/16 1731 06/19/16 1824 06/22/16 0938  NA 140 136  --  140 140 140 138 140  K 4.5 4.5  --  4.3 4.3 3.6 4.2 4.6  CL 100 99  --  101 101  --   --   --   CO2 29 28  --  33* 30  --   --  29  GLUCOSE 97 169*  --  97 88  --   --  167*  BUN 12 10  --  13 9  --   --  10.0  CALCIUM 9.0 8.9  --  9.3 9.3  --   --  9.2  CREATININE 1.03 1.00 0.90 0.89 1.02  --   --  0.8  GFRNONAA 77 80  --   --  >60  --   --   --   GFRAA >89 >89  --   --  >60  --   --   --     LIVER FUNCTION TESTS:  Recent Labs  04/10/16 0949 05/26/16 1540 06/08/16 1209 06/22/16 0938  BILITOT 0.8 0.6 0.8 0.72  AST 36* 29 47* 27  ALT 25 23 32 24  ALKPHOS 185* 259* 222* 255*  PROT 5.8* 6.6 6.5 6.2*  ALBUMIN 3.6 3.8 3.7 3.0*    Assessment and Plan: Patient with history of carcinoid tumor diagnosed from recent liver lesion biopsy. Also with hypermetabolic right lower lobe lung nodule with previous negative bronchoscopic biopsy. He now presents for CT-guided biopsy of the right lower lobe lung nodule to rule out malignancy.  Details/risks of procedure, including but not limited to, internal bleeding, infection, pneumothorax requiring chest tube placement, injury to adjacent structures, and death were discussed with patient and wife with their understanding and consent.   Electronically Signed: D. Rowe Robert 07/10/2016, 7:43 AM   I spent a total of 20 minutes at the the patient's bedside AND on the patient's hospital floor or unit, greater than 50% of which was counseling/coordinating care for CT-guided right lower lobe lung mass biopsy

## 2016-07-10 NOTE — Sedation Documentation (Signed)
Patient denies pain and is resting comfortably.  

## 2016-07-10 NOTE — Procedures (Signed)
Interventional Radiology Procedure Note  Procedure: CT guided biopsy of RLL nodule.  Biosentry deployed Complications: None Recommendations: - Bedrest until CXR cleared.  Minimize talking, coughing or otherwise straining.  - Follow up 1 hr CXR pending   Signed,  Lillias Difrancesco S. Earleen Newport, DO

## 2016-07-10 NOTE — Sedation Documentation (Addendum)
Dr Earleen Newport aware of BP and heart rate went from SR 70's to sinus tachycardia 125. Asymptomatic.

## 2016-07-10 NOTE — Sedation Documentation (Signed)
Patient is resting comfortably. 

## 2016-07-10 NOTE — Discharge Instructions (Signed)
Needle Biopsy of Lung, Care After Refer to this sheet in the next few weeks. These instructions provide you with information on caring for yourself after your procedure. Your health care provider may also give you more specific instructions. Your treatment has been planned according to current medical practices, but problems sometimes occur. Call your health care provider if you have any problems or questions after your procedure. WHAT TO EXPECT AFTER THE PROCEDURE  A bandage will be applied over the area where the needle was inserted. You may be asked to apply pressure to the bandage for several minutes to ensure there is minimal bleeding.  In most cases, you can leave when your needle biopsy procedure is completed. Do not drive yourself home. Someone else should take you home.  If you received an IV sedative or general anesthetic, you will be taken to a comfortable place to relax while the medicine wears off.  If you have upcoming travel scheduled, talk to your health care provider about when it is safe to travel by air after the procedure. HOME CARE INSTRUCTIONS  Expect to take it easy for the rest of the day.  Protect the area where you received the needle biopsy by keeping the bandage in place for as long as instructed.  You may feel some mild pain or discomfort in the area, but this should stop in a day or two.  Take medicines only as directed by your health care provider. SEEK MEDICAL CARE IF:   You have pain at the biopsy site that worsens or is not helped by medicine.  You have swelling or drainage at the needle biopsy site.  You have a fever. SEEK IMMEDIATE MEDICAL CARE IF:   You have new or worsening shortness of breath.  You have chest pain.  You are coughing up blood.  You have bleeding that does not stop with pressure or a bandage.  You develop light-headedness or fainting. This information is not intended to replace advice given to you by your health care  provider. Make sure you discuss any questions you have with your health care provider. Document Released: 05/24/2007 Document Revised: 08/17/2014 Document Reviewed: 12/19/2012 Elsevier Interactive Patient Education  2017 [employer].

## 2016-07-10 NOTE — Sedation Documentation (Signed)
02 d/c 

## 2016-07-14 ENCOUNTER — Ambulatory Visit: Payer: Commercial Managed Care - PPO | Admitting: Hematology

## 2016-07-16 NOTE — Progress Notes (Signed)
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 No evidence of mediastinal node metastasis or distant metastasis. Groundglass nodule in the right upper lobe is indeterminate 8. No metabolic activity associated with the enhancing hepatic lesion in left and right hepatic lobes, fever up benign etiology or hemangiomas, but malignancy is not ruled out.      05/24/2016 Imaging    Abdomen MRI with and without contrast showed multiple enhancing liver lesions, the largest in the inferior right liver shows central necrosis, suspicious for malignancy.      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       06/11/2016 Procedure    Patient underwent EGD and colonoscopy by Dr. Ardis Hughs, no significant tumor, to the ileum and distal stomach biopsy showed inflammatory change, no evidence of carcinoid. One polyp in ascending colon was removed, showed tubular adenoma.      06/22/2016 -  Chemotherapy    Sandostatin injections every 4 weeks        Adenocarcinoma of right lung ([place])   05/15/2016 Imaging    PET scan showed: 1. Intense metabolic activity  associated with the RIGHT lower lobe pulmonary nodule is concerning for bronchogenic carcinoma. No evidence of mediastinal nodal metastasis or distant metastasis (potential stage IA). 2. Ground-glass nodule RIGHT upper lobe is indeterminate. This lesion will require follow-uphowed       06/19/2016 Procedure    Bronchoscopy biopsy of the right lung mass was negative for malignancy      07/10/2016 Initial Diagnosis    Adenocarcinoma of right lung ([place])      07/10/2016 Procedure    CT-guided right lung mass biopsy showed non-small cell carcinoma, immunostaining was strongly positive for TTF-1, negative for CK 5/6, consistent with primary lung adenocarcinoma       HISTORY OF PRESENTING ILLNESS (05/15/2016):     Ruben Mccormick 60 y.o. male with past medical history of hepatitis B, hypertension, heavy smoking, is here because of his recent abnormal CT scan, which showed multiple liver lesions concerning for malignancy. He was referred by his primary care physician Dr. Melford Aase.    He has had diarrhea for 1.5 years, it has been getting worse lately, up 20 times a day. He was seen by Dr. Melford Aase and started taking Viberzi in 02/2016 which helps a lot. He underwent abdominal ultrasound, which showed a large lesion in the right lobe of the liver. He subsequently underwent abdominal and pelvis CT with contrast, which showed a dominant 7.6 cm necrotic mass in the inferior right lobe, and multiple other small lesions in the liver.   His last colonoscopy was 5 years ago which was normal per  patient. He denies significant abdominal pain or discomfort, no melano or hemachizia, but he has mild heart burn and gassy feeling from [employer]. No other symptoms. He has chronic mild dyspnia from smoking. He lost about 25 lbs in the past 4-5 months. 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   He lives with his wife, has two daughters and one son.  His previous hepatitis B test showed positive S antigen and E antibody, hepatitis C negative. Patient is on not aware of his prior hepatitis B infection. He did use cocaine, but no IV drugs. No history of blood transfusion.  CURRENT THERAPY:  somatostatin injection every 4 weeks, started on 06/22/2016  INTERIM HISTORY  Mr. Uzelac returns for follow-up and review of lung biopsy. He is accompanied by his wife. He is doing okay. After his first injection, he did not notice much difference. He does report gas pains after eating anything and needing to move his bowels. He takes viberzi in the mornings and as the day goes on his bowels are worse.   He reports developing bumps on the skin over his abdomen and back over the last 6 months which itch and scab over. He has not tried any medication because he wasn't sure what to use. He later admits he tried hydrogen peroxide and lotion. He has stuff for dry skin but it hasn't helped with this.   He had a little bit of bleeding for a couple days of his biopsy which resolved. He has healed well.   His wife reports the patient is fatigued. He sleeps a lot. She is requesting for a B12 injection to help with this fatigue. She showed a picture of the patient on Thanksgiving with severe bruising which had developed on his face in the span of three hours.    MEDICAL HISTORY:  Past Medical History:  Diagnosis Date  . History of kidney stones   . Hyperlipidemia   . Hypertension   . Hypogonadism male   . Pre-diabetes   . Vitamin D deficiency     SURGICAL HISTORY: Past Surgical History:  Procedure Laterality Date  . APPENDECTOMY    . COLONOSCOPY WITH PROPOFOL N/A 06/11/2016   Procedure: COLONOSCOPY WITH PROPOFOL;  Surgeon: Milus Banister, MD;  Location: [facility];  Service: Endoscopy;  Laterality: N/A;  . ESOPHAGOGASTRODUODENOSCOPY (EGD) WITH PROPOFOL N/A 06/11/2016   Procedure: ESOPHAGOGASTRODUODENOSCOPY (EGD) WITH PROPOFOL;  Surgeon: Milus Banister, MD;   Location: [facility];  Service: Endoscopy;  Laterality: N/A;  . VIDEO BRONCHOSCOPY WITH ENDOBRONCHIAL NAVIGATION Right 06/19/2016   Procedure: VIDEO BRONCHOSCOPY WITH ENDOBRONCHIAL NAVIGATION with RLL brushings, washings and bx;  Surgeon: [name], MD;  Location: [place];  Service: Thoracic;  Laterality: Right;    SOCIAL HISTORY: Social History   Social History  . Marital status: Married    Spouse name: N/A  . Number of children: N/A  . Years of education: N/A   Occupational History  . Not on file.   Social History Main Topics  . Smoking status: Former Smoker    Packs/day: 1.00    Years: 40.00    Types: Cigarettes    Quit date: 09/11/2015  . Smokeless tobacco: Never Used  . Alcohol use 16.8 oz/week    14 Shots of liquor, 14 Standard drinks or equivalent per week     Comment: 2 drinks daily since young   . Drug use:  No     Comment: cocaine in the past, no iv drugs   . Sexual activity: Not on file   Other Topics Concern  . Not on file   Social History Narrative  . No narrative on file    FAMILY HISTORY: Family History  Problem Relation Age of Onset  . Diabetes Father   . Cancer Father     mesothelioma   . Alzheimer's disease Mother   . Parkinson's disease Mother   . Cancer Paternal Grandfather     colon    ALLERGIES:  is allergic to bee venom; ppd [tuberculin purified protein derivative]; and voltaren [diclofenac sodium].  MEDICATIONS:  Current Outpatient Prescriptions  Medication Sig Dispense Refill  . aspirin 325 MG tablet Take 325 mg by mouth daily.    . Aspirin-Acetaminophen-Caffeine (GOODY HEADACHE PO) Take 1 packet by mouth daily as needed (headaches).    . B-D 3CC LUER-LOK SYR 21GX1" 21G X 1" 3 ML MISC USE AS DIRECTED FOR TESTOSTERONE 10 each 0  . Cholecalciferol (VITAMIN D-3) 5000 UNITS TABS Take 5,000 Units by mouth daily.     . Eluxadoline (VIBERZI) 100 MG TABS Take 1 tablet by mouth 2 (two) times daily. (Patient taking differently: Take 1 tablet  by mouth daily. ) 180 tablet 0  . fluticasone (FLONASE) 50 MCG/ACT nasal spray INSTILL 2 SPRAYS INTO THE NOSTRILS AT NIGHT AS NEEDED FOR CONGESTION  5  . hydrochlorothiazide (HYDRODIURIL) 25 MG tablet Take 1 tablet (25 mg total) by mouth daily. 90 tablet 0  . Multiple Vitamin (MULTIVITAMIN WITH MINERALS) TABS tablet Take 1 tablet by mouth daily.    Marland Kitchen PREVIDENT 5000 BOOSTER PLUS 1.1 % PSTE Take 1 application by mouth daily.   3  . testosterone cypionate (DEPOTESTOSTERONE CYPIONATE) 200 MG/ML injection INJECT 2 ML INTO THE MUSCLE EVERY 2 WEEKS 10 mL 5  . Triamcinolone Acetonide (TRIAMCINOLONE 0.1 % CREAM : EUCERIN) CREA Apply 1 application topically 2 (two) times daily. 1 each 11  . valACYclovir (VALTREX) 500 MG tablet TAKE 1 TABLET BY MOUTH TWICE DAILY AS NEEDED FOR FEVER BLISTER 90 tablet 1   No current facility-administered medications for this visit.     REVIEW OF SYSTEMS:   Constitutional: Denies fevers, chills or abnormal night sweats (+) fatigue Eyes: Denies blurriness of vision, double vision or watery eyes Ears, nose, mouth, throat, and face: Denies mucositis or sore throat Respiratory: Denies cough, dyspnea or wheezes Cardiovascular: Denies palpitation, chest discomfort or lower extremity swelling Gastrointestinal:  Denies nausea, heartburn or change in bowel habits Skin: Denies abnormal skin rashes (+) dry, itchy bumps on skin over abdomen and back Lymphatics: Denies new lymphadenopathy or easy bruising Neurological:Denies numbness, tingling or new weaknesses Behavioral/Psych: Mood is stable, no new changes  All other systems were reviewed with the patient and are negative.  PHYSICAL EXAMINATION: ECOG PERFORMANCE STATUS: 1 - Symptomatic but completely ambulatory  Vitals:   07/17/16 1548  BP: (!) 149/92  Pulse: 67  Resp: 18  Temp: 97.9 F (36.6 C)   Filed Weights   07/17/16 1548  Weight: 152 lb 14.4 oz (69.4 kg)    GENERAL:alert, no distress and comfortable SKIN:  skin color, texture, turgor are normal, no rashes or significant lesions (+) very dry skin and sporadic scaly rash with scratch markers over abdomen and back EYES: normal, conjunctiva are pink and non-injected, sclera clear OROPHARYNX:no exudate, no erythema and lips, buccal mucosa, and tongue normal  NECK: supple, thyroid normal size, non-tender, without nodularity LYMPH:  no palpable lymphadenopathy in the cervical, axillary or inguinal LUNGS: clear to auscultation and percussion with normal breathing effort HEART: regular rate & rhythm and no murmurs and no lower extremity edema ABDOMEN:abdomen soft, non-tender and normal bowel sounds, no organomegaly. Rectal exam was negative for palpable mass, no blood on the tip of gloves   Musculoskeletal:no cyanosis of digits and no clubbing  PSYCH: alert & oriented x 3 with fluent speech NEURO: no focal motor/sensory deficits  LABORATORY DATA:  I have reviewed the data as listed CBC Latest Ref Rng & Units 07/10/2016 06/22/2016 06/19/2016  WBC 4.0 - 10.5 K/uL 5.3 6.3 -  Hemoglobin 13.0 - 17.0 g/dL 14.7 14.8 15.6  Hematocrit 39.0 - 52.0 % 46.6 46.8 46.0  Platelets 150 - 400 K/uL 183 206 -   CMP Latest Ref Rng & Units 06/22/2016 06/19/2016 06/19/2016  Glucose 70 - 140 mg/dl 167(H) - -  BUN 7.0 - 26.0 mg/dL 10.0 - -  Creatinine 0.7 - 1.3 mg/dL 0.8 - -  Sodium 136 - 145 mEq/L 140 138 140  Potassium 3.5 - 5.1 mEq/L 4.6 4.2 3.6  Chloride 101 - 111 mmol/L - - -  CO2 22 - 29 mEq/L 29 - -  Calcium 8.4 - 10.4 mg/dL 9.2 - -  Total Protein 6.4 - 8.3 g/dL 6.2(L) - -  Total Bilirubin 0.20 - 1.20 mg/dL 0.72 - -  Alkaline Phos 40 - 150 U/L 255(H) - -  AST 5 - 34 U/L 27 - -  ALT 0 - 55 U/L 24 - -   PATHOLOGY REPORT  Diagnosis 06/08/2016 Liver, biopsy, Right hepatic lobe - METASTATIC LOW GRADE NEUROENDOCRINE TUMOR (CARCINOID TUMOR). - SEE COMMENT. Microscopic Comment To help phenotype the tumor, immunohistochemical stains were performed. The tumor cells  are positive for synaptophysin, CD56, chromogranin, and NSE. They are negative for arginase, cytokeratin 5/6, cytokeratin 7, Glyp[place], Hepar-1, and TTF-1. The findings are consistent with metastatic low grade neuroendocrine tumor. Additional studies can be performed upon clinician request. Dr. Burr Medico was paged on 06/10/16. Dr. Saralyn Pilar has reviewed the case and concurs with this interpretation. (JBK:gt, 06/10/16)  Diagnosis 06/11/2016 1. Ileum, biopsy, terminal - INFLAMMATORY PROLAPSE TYPE POLYP. - NO DYSPLASIA OR MALIGNANCY. 2. Colon, polyp(s), ascending - TUBULAR ADENOMA (X3 FRAGMENTS). - NO HIGH GRADE DYSPLASIA OR MALIGNANCY. 3. Stomach, biopsy, distal - CHRONIC ACTIVE GASTRITIS. - NEGATIVE FOR HELICOBACTER PYLORI. - NO INTESTINAL METAPLASIA, DYSPLASIA, OR MALIGNANCY. - NO EVIDENCE OF CARCINOID. Microscopic Comment 3. A Warthin-Starry stain is performed to determine the possibility of the presence of Helicobacter pylori. The Warthin-Starry stain is negative for organisms of Helicobacter pylori.  Diagnosis 07/10/2016 Lung, needle/core biopsy(ies), Right Lower Lobe - NON SMALL CELL CARCINOMA. Microscopic Comment Immunohistochemistry will be performed and reported as an addendum. (JDP:gt, 07/13/16) Addendum: Immunohistochemistry shows strong positivity with thyroid transcription factor-1 and negative staining with cytokeratin 5/6 and the immunophenotype is consistent with primary lung adenocarcinoma. (JDP:ecj 07/14/2016)  RADIOGRAPHIC STUDIES: I have personally reviewed the radiological images as listed and agreed with the findings in the report. Ct Biopsy  Result Date: 07/10/2016 INDICATION: 60 year old male with a history of right lower lobe lung nodule. FDG avid. EXAM: CT BIOPSY MEDICATIONS: None. ANESTHESIA/SEDATION: Moderate (conscious) sedation was employed during this procedure. A total of Versed 1.0 mg and Fentanyl 50 mcg was administered intravenously. Moderate Sedation Time: 15  minutes. The patient's level of consciousness and vital signs were monitored continuously by radiology nursing throughout the procedure under my direct supervision. FLUOROSCOPY TIME:  CT COMPLICATIONS:  None PROCEDURE: The procedure, risks, benefits, and alternatives were explained to the patient and the patient's family. Specific risks that were addressed included bleeding, infection, pneumothorax, need for further procedure including chest tube placement, chance of delayed pneumothorax or hemorrhage, hemoptysis, nondiagnostic sample, cardiopulmonary collapse, death. Questions regarding the procedure were encouraged and answered. The patient understands and consents to the procedure. Patient was positioned in the prone position on the CT gantry table and a scout CT of the chest was performed for planning purposes. Once angle of approach was determined, the skin and subcutaneous tissues this scan was prepped and draped in the usual sterile fashion, and a sterile drape was applied covering the operative field. A sterile gown and sterile gloves were used for the procedure. Local anesthesia was provided with 1% Lidocaine. The skin and subcutaneous tissues were infiltrated 1% lidocaine for local anesthesia, and a small stab incision was made with an 11 blade scalpel. Using CT guidance, a 17 gauge trocar needle was advanced into the right lower lobetarget. After confirmation of the tip, separate 18 gauge core biopsies were performed. These were placed into solution for transportation to the lab. Biosentry Device was deployed. A final CT image was performed. Patient tolerated the procedure well and remained hemodynamically stable throughout. No complications were encountered and no significant blood loss was encounter IMPRESSION: Status post CT-guided biopsy of right lower lobe nodule with tissue specimen sent to pathology for complete histopathologic analysis. Signed, Dulcy Fanny. Earleen Newport, DO [facility] Radiology Electronically Signed   By: Corrie Mckusick D.O.   On: 07/10/2016 13:58   Dg Chest Port 1 View  Result Date: 07/10/2016 CLINICAL DATA:  Status post right lower lobe lung biopsy EXAM: PORTABLE CHEST 1 VIEW COMPARISON:  06/19/2016 FINDINGS: Cardiac shadow is within normal limits. Post biopsy changes are noted in the right lung base consistent with the recent procedure. No evidence of pneumothorax is seen. No bony abnormality is noted. IMPRESSION: No evidence of post biopsy pneumothorax. Electronically Signed   By: Inez Catalina M.D.   On: 07/10/2016 10:33   Dg Chest Portable 1 View  Result Date: 06/19/2016 CLINICAL DATA:  Respiratory decompensation falling bronchoscopy. The heart, hila, and mediastinum are normal. EXAM: PORTABLE CHEST 1 VIEW COMPARISON:  December 05, 2015 FINDINGS: Mild opacity in the right apex may be from recent bronchoscopy. No pneumothorax after bronchoscopy. A skin fold is seen over the lateral right lower chest. The patient appears to have an ETT which terminates in the mid trachea. No other interval changes or acute abnormalities. IMPRESSION: Appropriate placement of ET tube. No pneumothorax after bronchoscopy. Electronically Signed   By: Dorise Bullion III M.D   On: 06/19/2016 19:27   EGD 06/11/2016 - Normal esophagus. - Normal examined duodenum. - Abnromal distal stomach, this was somewhat neoplastic appearing, biospied extensively. - The examination was otherwise normal.  Colonoscopy 06/11/2016: One abnormal appearing ileal polypoid leion in the terminal ileum. Biopsied. - Two 8 to 11 mm polyps in the transverse colon and in the ascending colon, removed with a hot snare. Resected and retrieved. - One 5 mm polyp in the transverse colon, removed with a cold snare. Resected and retrieved. - Diverticulosis in the left colon.  ECHO 06/05/2016 - Left ventricle: The cavity size was normal. Left ventricular   diastolic function  parameters were normal. - Ventricular septum: The contour showed diastolic flattening.   These changes are consistent with RV volume overload. - Right ventricle: The cavity size was  severely dilated. Systolic   function was mildly reduced. - Right atrium: The atrium was severely dilated. - Tricuspid valve: Mobility was restricted. There was malcoaptation   of the valve leaflets. There was wide-open regurgitation. 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  ASSESSMENT & PLAN:  60 year old male with past medical history of hypertension, hepatitis B infection, presented with worsening diarrhea for one and half years, fatigue, anorexia and weight loss.  1. Adenocarcinoma of right lung, cT1N0M0, stage I  -His right lower lobe nodule is intensely hypermetabolic on PET scan, no significant adenopathy or other hypermetabolic lesion suggesting metastasis. This is highly suspicious for primary lung cancer -His right lower lobe biopsy 07/10/2016 was positive for non small cell lung carcinoma, immunostaining supports adenocarcinoma. I discussed the biopsy results with patient and his wife -We again reviewed his PET scan findings, and his lung cancer staging. -I discussed treatment options, including radiation therapy SBRT and surgery. We discussed that lobectomy is still standard therapy. However given his metastatic carcinoid tumor, I think radiation with SBRT maybe a better option for him, we discussed the success rate with SBRT is similar to surgery. Potential side effects from the severity were discussed with patient. He is interested in a referral to radiation oncology. I will refer to Dr. Tammi Klippel.   2. 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 No significant hilar or mediastinal lymphadenopathy. This additional small groundglass nodule in the right upper lobe, indeterminate. I reviewed the imaging findings with patient -However his multiple liver lesions are not hypermetabolic on the PET scan, could be a secondary primary, versus benign lesions. This was further evaluated by liver MRI, which showed multiple enhancing liver lesions, suspicious for malignancy. -I previously discussed his liver biopsy results, which showed metastatic carcinoid tumor, low-grade -His EGD showed abnormal appearing of the distal stomach, however the biopsy only showed inflammatory change. Colonoscopy was also an negative except polyps, and biopsy was negative for carcinoid tumor. - I recommend him to consider Ga DOTATATE PET scan to identify his primary tumpor, which was recently approved and our radiology department just started using it this month. He's lost her Sandostatin injection was 4 weeks ago, I'll hold on Sandostatin injection, and to the scan early next week -We previously discussed the nature history of metastatic carcinoid tumor. Due to his diffuse liver metastasis, I do not think he is a candidate for upfront metastasectomy, and his disease is likely incurable. We discussed this is a indolent disease, and most people do well with treatment -the goal of therapy is palliative, to prolong his life and improve his quality of life  -He has started Sandostatin injection 4 weeks ago, tolerated well, will increase to 30 mg every 4 weeks, from next week, will give the injection after his Ga-DOTATATE PET scan  -his diarrhea has not improved much, I will start him on Xermelo '250mg'$  tid. Potential side effects, such as nausea, headache, peripheral edema, abnormal liver functions, anorexia, constipation, etc., were discussed with patient, he agrees to try.   3. 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 He did well with first injection, will continue treatment every 4 weeks with increased dose to '30mg'$  from second cycle.  -will start him on Xermelo for his diarrhea   Plan -Lung biopsy reviewed.  -I have ordered Ga-DOTATATE PET scan to be done early next week -I called in a prescription for Xermelo for his diarrhea  -Continue second Sandostatin injection next week, will increase to '30mg'$  for second cycle, and continue every 4 weeks  -will check serum chromogranin A, and a 24-hour urine 5 HIAA every month for now  -I have referred the patient for consultation with radiation oncologist, Dr. Tammi Klippel, next week or the week after -I will see him back in 5 weeks with lab. He should be completed with radiation treatment by next visit and his third Sandostatin injection.    All questions were answered. The patient knows to call the clinic with any problems, questions or concerns.  I spent 35 minutes counseling the patient face to face. The total time spent in the appointment was 40 minutes and more than 50% was on counseling.  This document serves as a record of services personally performed by Truitt Merle, MD. It was created on her behalf by Arlyce Harman, a trained medical scribe. The creation of this record is based on the scribe's personal observations and the provider's statements to them. This document has been checked and approved by the attending provider.    Truitt Merle, MD 07/18/2016

## 2016-07-17 ENCOUNTER — Ambulatory Visit (HOSPITAL_BASED_OUTPATIENT_CLINIC_OR_DEPARTMENT_OTHER): Payer: Commercial Managed Care - PPO | Admitting: Hematology

## 2016-07-17 ENCOUNTER — Telehealth: Payer: Self-pay | Admitting: Hematology

## 2016-07-17 VITALS — BP 149/92 | HR 67 | Temp 97.9°F | Resp 18 | Ht 67.0 in | Wt 152.9 lb

## 2016-07-17 DIAGNOSIS — K529 Noninfective gastroenteritis and colitis, unspecified: Secondary | ICD-10-CM

## 2016-07-17 DIAGNOSIS — C7B02 Secondary carcinoid tumors of liver: Secondary | ICD-10-CM

## 2016-07-17 DIAGNOSIS — E340 Carcinoid syndrome: Secondary | ICD-10-CM

## 2016-07-17 DIAGNOSIS — C3491 Malignant neoplasm of unspecified part of right bronchus or lung: Secondary | ICD-10-CM

## 2016-07-17 DIAGNOSIS — C7A00 Malignant carcinoid tumor of unspecified site: Secondary | ICD-10-CM

## 2016-07-17 DIAGNOSIS — C3431 Malignant neoplasm of lower lobe, right bronchus or lung: Secondary | ICD-10-CM | POA: Insufficient documentation

## 2016-07-17 NOTE — Telephone Encounter (Signed)
Gave patient avs report and appointments for December thru February, including 12/14 with Dr. Tammi Klippel. No referral for radonc entered yet. Spoke with Threasa Beards re appointments.

## 2016-07-18 ENCOUNTER — Encounter: Payer: Self-pay | Admitting: Hematology

## 2016-07-18 MED ORDER — TELOTRISTAT ETHYL(AS ETIPRATE) 250 MG PO TABS: 250.0000 mg | ORAL_TABLET | Freq: Three times a day (TID) | ORAL | 2 refills | Status: DC

## 2016-07-20 ENCOUNTER — Encounter: Payer: Self-pay | Admitting: Radiation Oncology

## 2016-07-22 ENCOUNTER — Other Ambulatory Visit: Payer: Self-pay | Admitting: *Deleted

## 2016-07-22 MED ORDER — TELOTRISTAT ETHYL(AS ETIPRATE) 250 MG PO TABS: 250.0000 mg | ORAL_TABLET | Freq: Three times a day (TID) | ORAL | 2 refills | Status: DC

## 2016-07-22 NOTE — Telephone Encounter (Signed)
I called pt and explained that the Ga-dotatate PET scan is not ready yet, and will postpone his PET to next month, a few days before his injection on 08/21/16.  I have called in [place] for his carcinoid syndrome induced diarrhea, the medication was called into his specialty pharmacy, he knows to call us if he does not hear from them by next Monday, or if he has high co-pay. Side effects were reviewed with him, he agrees to start when he receive it.   Truitt Merle  07/22/2016

## 2016-07-23 ENCOUNTER — Encounter: Payer: Self-pay | Admitting: Radiation Oncology

## 2016-07-23 ENCOUNTER — Ambulatory Visit (HOSPITAL_BASED_OUTPATIENT_CLINIC_OR_DEPARTMENT_OTHER): Payer: Commercial Managed Care - PPO

## 2016-07-23 ENCOUNTER — Ambulatory Visit
Admission: RE | Admit: 2016-07-23 | Discharge: 2016-07-23 | Disposition: A | Discharge status: Home / Self Care | Payer: Commercial Managed Care - PPO | Source: Ambulatory Visit | Attending: Radiation Oncology | Admitting: Radiation Oncology

## 2016-07-23 VITALS — BP 156/102 | HR 77 | Resp 18 | Wt 147.2 lb

## 2016-07-23 VITALS — BP 146/90 | HR 78 | Temp 98.2°F | Resp 20

## 2016-07-23 DIAGNOSIS — Z79899 Other long term (current) drug therapy: Secondary | ICD-10-CM | POA: Insufficient documentation

## 2016-07-23 DIAGNOSIS — Z510 Encounter for antineoplastic radiation therapy: Secondary | ICD-10-CM | POA: Insufficient documentation

## 2016-07-23 DIAGNOSIS — R7303 Prediabetes: Secondary | ICD-10-CM | POA: Insufficient documentation

## 2016-07-23 DIAGNOSIS — E559 Vitamin D deficiency, unspecified: Secondary | ICD-10-CM | POA: Insufficient documentation

## 2016-07-23 DIAGNOSIS — C7B02 Secondary carcinoid tumors of liver: Secondary | ICD-10-CM

## 2016-07-23 DIAGNOSIS — I10 Essential (primary) hypertension: Secondary | ICD-10-CM | POA: Insufficient documentation

## 2016-07-23 DIAGNOSIS — Z7951 Long term (current) use of inhaled steroids: Secondary | ICD-10-CM | POA: Insufficient documentation

## 2016-07-23 DIAGNOSIS — Z87442 Personal history of urinary calculi: Secondary | ICD-10-CM | POA: Insufficient documentation

## 2016-07-23 DIAGNOSIS — Z888 Allergy status to other drugs, medicaments and biological substances status: Secondary | ICD-10-CM | POA: Insufficient documentation

## 2016-07-23 DIAGNOSIS — C3431 Malignant neoplasm of lower lobe, right bronchus or lung: Secondary | ICD-10-CM | POA: Insufficient documentation

## 2016-07-23 DIAGNOSIS — E785 Hyperlipidemia, unspecified: Secondary | ICD-10-CM | POA: Insufficient documentation

## 2016-07-23 DIAGNOSIS — J449 Chronic obstructive pulmonary disease, unspecified: Secondary | ICD-10-CM | POA: Insufficient documentation

## 2016-07-23 DIAGNOSIS — Z87891 Personal history of nicotine dependence: Secondary | ICD-10-CM | POA: Insufficient documentation

## 2016-07-23 DIAGNOSIS — C7A00 Malignant carcinoid tumor of unspecified site: Secondary | ICD-10-CM

## 2016-07-23 DIAGNOSIS — Z7982 Long term (current) use of aspirin: Secondary | ICD-10-CM | POA: Insufficient documentation

## 2016-07-23 DIAGNOSIS — E340 Carcinoid syndrome: Secondary | ICD-10-CM | POA: Insufficient documentation

## 2016-07-23 DIAGNOSIS — E291 Testicular hypofunction: Secondary | ICD-10-CM | POA: Insufficient documentation

## 2016-07-23 HISTORY — DX: Malignant neoplasm of unspecified part of unspecified bronchus or lung: C34.90

## 2016-07-23 MED ORDER — OCTREOTIDE ACETATE 30 MG IM KIT
30.0000 mg | PACK | Freq: Once | INTRAMUSCULAR | Status: AC
  Administered 2016-07-23: 30 mg via INTRAMUSCULAR
  Filled 2016-07-23: qty 1

## 2016-07-23 NOTE — Patient Instructions (Signed)

## 2016-07-23 NOTE — Progress Notes (Signed)
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 Adenocarcinoma of right lower lobe of lung (HCC) 162.5 C34.31     HISTORY OF PRESENT ILLNESS::Ruben Mccormick is a 60 y.o. male  who presented with increasing episodes of diarrhea over the past 18 months. He underwent abdominal ultrasound on 04/24/16. This showed multiple masses in the right lobe of the liver. Abdominal CT on 05/01/16 confirmed a 7.6 cm necrotic mass in the inferior right liver. He also had several smaller hypervascular liver lesions.  In addition, scan showed a 16 mm RLL nodule. This prompted a PET scan on 05/15/16. The PET scan confirmed a 15 mm RLL nodule with intense hypermetabolism suggesting malignancy. The liver lesions exhibited no hypermetabolism. Ultrasound guided liver biopsy on 06/08/16 revealed metastatic carcinoid tumor. CT guided lung biopsy on 07/10/16 revealed adenocarcinoma.   The patient is a former smoker; he quit in February 2017. He reports Sandostatin injections every 4 weeks, the first given on 06/22/16. He denies pain. The patient reports he is fatigued and "sleeps a lot." He reports he has lost 25 lbs in the last 4-5 months. He drinks 1 Boost per day and eats small, frequent meals. He complains of chronic mild dyspnea. He denies a cough, but reports some congestion. Denies hemoptysis. Of note, the patient's father had mesothelioma.  The patient comes to the clinic today for discussion of the role that radiation therapy, specifically SBRT, may play in the treatment of his disease.  PREVIOUS RADIATION THERAPY: No  Past Medical History:  Diagnosis Date  . History of kidney stones   . Hyperlipidemia   . Hypertension   .  Hypogonadism male   . Lung cancer ([place])   . Pre-diabetes   . Vitamin D deficiency   :   Past Surgical History:  Procedure Laterality Date  . APPENDECTOMY    . COLONOSCOPY WITH PROPOFOL N/A 06/11/2016   Procedure: COLONOSCOPY WITH PROPOFOL;  Surgeon: Milus Banister, MD;  Location: [facility];  Service: Endoscopy;  Laterality: N/A;  . ESOPHAGOGASTRODUODENOSCOPY (EGD) WITH PROPOFOL N/A 06/11/2016   Procedure: ESOPHAGOGASTRODUODENOSCOPY (EGD) WITH PROPOFOL;  Surgeon: Milus Banister, MD;  Location: [facility];  Service: Endoscopy;  Laterality: N/A;  . VIDEO BRONCHOSCOPY WITH ENDOBRONCHIAL NAVIGATION Right 06/19/2016   Procedure: VIDEO BRONCHOSCOPY WITH ENDOBRONCHIAL NAVIGATION with RLL brushings, washings and bx;  Surgeon: [name], MD;  Location: [facility];  Service: Thoracic;  Laterality: Right;  :   Current Outpatient Prescriptions:  .  aspirin 325 MG tablet, Take 325 mg by mouth daily., Disp: , Rfl:  .  Aspirin-Acetaminophen-Caffeine (GOODY HEADACHE PO), Take 1 packet by mouth daily as needed (headaches)., Disp: , Rfl:  .  B-D 3CC LUER-LOK SYR 21GX1" 21G X 1" 3 ML MISC, USE AS DIRECTED FOR TESTOSTERONE, Disp: 10 each, Rfl: 0 .  Cholecalciferol (VITAMIN D-3) 5000 UNITS TABS, Take 5,000 Units by mouth daily. , Disp: , Rfl:  .  Eluxadoline (VIBERZI) 100 MG TABS, Take 1 tablet by mouth 2 (two) times daily. (Patient taking differently: Take 1 tablet by mouth daily. ), Disp: 180 tablet, Rfl: 0 .  fluticasone (FLONASE) 50 MCG/ACT nasal spray, INSTILL 2 SPRAYS INTO THE NOSTRILS AT  NIGHT AS NEEDED FOR CONGESTION, Disp: , Rfl: 5 .  hydrochlorothiazide (HYDRODIURIL) 25 MG tablet, Take 1 tablet (25 mg total) by mouth daily., Disp: 90 tablet, Rfl: 0 .  Multiple Vitamin (MULTIVITAMIN WITH MINERALS) TABS tablet, Take 1 tablet by mouth daily., Disp: , Rfl:  .  PREVIDENT 5000 BOOSTER PLUS 1.1 % PSTE, Take 1 application by mouth daily. , Disp: , Rfl: 3 .  Telotristat Etiprate (XERMELO) 250 MG TABS,  Take 250 mg by mouth 3 (three) times daily with meals., Disp: 90 tablet, Rfl: 2 .  testosterone cypionate (DEPOTESTOSTERONE CYPIONATE) 200 MG/ML injection, INJECT 2 ML INTO THE MUSCLE EVERY 2 WEEKS, Disp: 10 mL, Rfl: 5 .  Triamcinolone Acetonide (TRIAMCINOLONE 0.1 % CREAM : EUCERIN) CREA, Apply 1 application topically 2 (two) times daily., Disp: 1 each, Rfl: 11 .  valACYclovir (VALTREX) 500 MG tablet, TAKE 1 TABLET BY MOUTH TWICE DAILY AS NEEDED FOR FEVER BLISTER, Disp: 90 tablet, Rfl: 1:   Allergies  Allergen Reactions  . Bee Venom Anaphylaxis  . Ppd [Tuberculin Purified Protein Derivative] Rash  . Voltaren [Diclofenac Sodium] Rash  :   Family History  Problem Relation Age of Onset  . Diabetes Father   . Cancer Father     mesothelioma   . Alzheimer's disease Mother   . Parkinson's disease Mother   . Cancer Paternal Grandfather     colon  :   Social History   Social History  . Marital status: Married    Spouse name: N/A  . Number of children: N/A  . Years of education: N/A   Occupational History  . Not on file.   Social History Main Topics  . Smoking status: Former Smoker    Packs/day: 1.00    Years: 40.00    Types: Cigarettes    Quit date: 09/11/2015  . Smokeless tobacco: Never Used  . Alcohol use 16.8 oz/week    14 Shots of liquor, 14 Standard drinks or equivalent per week     Comment: 2 drinks daily since young   . Drug use: No     Comment: cocaine in the past, no iv drugs   . Sexual activity: Not on file   Other Topics Concern  . Not on file   Social History Narrative  . No narrative on file  :  REVIEW OF SYSTEMS:  A 15 point review of systems is documented in the electronic medical record. This was obtained by the nursing staff. However, I reviewed this with the patient to discuss relevant findings and make appropriate changes.  Pertinent items noted in HPI and remainder of comprehensive ROS otherwise negative.   PHYSICAL EXAM:  Blood pressure (!)  156/102, pulse 77, resp. rate 18, weight 147 lb 3.2 oz (66.8 kg), SpO2 95 %. In general this is a well appearing Caucasian male in no acute distress. He's alert and oriented x4 and appropriate throughout the examination.   KPS = 90  100 - Normal; no complaints; no evidence of disease. 90   - Able to carry on normal activity; minor signs or symptoms of disease. 80   - Normal activity with effort; some signs or symptoms of disease. 59   - Cares for self; unable to carry on normal activity or to do active work. 60   - Requires occasional assistance, but is able to care for most of his personal needs. 50   - Requires considerable assistance and frequent medical care. 63   - Disabled; requires special care  and assistance. 76   - Severely disabled; hospital admission is indicated although death not imminent. 18   - Very sick; hospital admission necessary; active supportive treatment necessary. 10   - Moribund; fatal processes progressing rapidly. 0     - Dead  Karnofsky DA, Abelmann [place], Craver LS and [place] [facility] [phone]) The use of the nitrogen mustards in the palliative treatment of carcinoma: with particular reference to bronchogenic carcinoma Cancer 1 634-56  LABORATORY DATA:  Lab Results  Component Value Date   WBC 5.3 07/10/2016   HGB 14.7 07/10/2016   HCT 46.6 07/10/2016   MCV 95.1 07/10/2016   PLT 183 07/10/2016   Lab Results  Component Value Date   NA 140 06/22/2016   K 4.6 06/22/2016   CL 101 06/08/2016   CO2 29 06/22/2016   Lab Results  Component Value Date   ALT 24 06/22/2016   AST 27 06/22/2016   ALKPHOS 255 (H) 06/22/2016   BILITOT 0.72 06/22/2016     RADIOGRAPHY: Ct Biopsy  Result Date: 07/10/2016 INDICATION: 60 year old male with a history of right lower lobe lung nodule. FDG avid. EXAM: CT BIOPSY MEDICATIONS: None. ANESTHESIA/SEDATION: Moderate (conscious) sedation was employed during this procedure. A total of Versed 1.0 mg and Fentanyl 50 mcg was administered  intravenously. Moderate Sedation Time: 15 minutes. The patient's level of consciousness and vital signs were monitored continuously by radiology nursing throughout the procedure under my direct supervision. FLUOROSCOPY TIME:  CT COMPLICATIONS: None PROCEDURE: The procedure, risks, benefits, and alternatives were explained to the patient and the patient's family. Specific risks that were addressed included bleeding, infection, pneumothorax, need for further procedure including chest tube placement, chance of delayed pneumothorax or hemorrhage, hemoptysis, nondiagnostic sample, cardiopulmonary collapse, death. Questions regarding the procedure were encouraged and answered. The patient understands and consents to the procedure. Patient was positioned in the prone position on the CT gantry table and a scout CT of the chest was performed for planning purposes. Once angle of approach was determined, the skin and subcutaneous tissues this scan was prepped and draped in the usual sterile fashion, and a sterile drape was applied covering the operative field. A sterile gown and sterile gloves were used for the procedure. Local anesthesia was provided with 1% Lidocaine. The skin and subcutaneous tissues were infiltrated 1% lidocaine for local anesthesia, and a small stab incision was made with an 11 blade scalpel. Using CT guidance, a 17 gauge trocar needle was advanced into the right lower lobetarget. After confirmation of the tip, separate 18 gauge core biopsies were performed. These were placed into solution for transportation to the lab. Biosentry Device was deployed. A final CT image was performed. Patient tolerated the procedure well and remained hemodynamically stable throughout. No complications were encountered and no significant blood loss was encounter IMPRESSION: Status post CT-guided biopsy of right lower lobe nodule with tissue specimen sent to pathology for complete histopathologic analysis. Signed, Dulcy Fanny.  Earleen Newport, DO [facility] Radiology Electronically Signed   By: Corrie Mckusick D.O.   On: 07/10/2016 13:58   Dg Chest Port 1 View  Result Date: 07/10/2016 CLINICAL DATA:  Status post right lower lobe lung biopsy EXAM: PORTABLE CHEST 1 VIEW COMPARISON:  06/19/2016 FINDINGS: Cardiac shadow is within normal limits. Post biopsy changes are noted in the right lung base consistent with the recent procedure. No evidence of pneumothorax is seen. No bony abnormality is noted. IMPRESSION: No evidence of post biopsy pneumothorax. Electronically Signed  By: Inez Catalina M.D.   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 He is not an ideal surgical candidate due to underlying comorbidities, including severe COPD, metastatic carcinoid tumor, and carcinoid syndrome. He may be a good candidate for SBRT.  PLAN: Today, I talked to the patient about the findings and work-up thus far.  We discussed the natural history of unresectable Stage I lung cancer and general treatment, highlighting the role of radiotherapy in the management.  We discussed the available stereotactic radiation techniques, and focused on the details of logistics and delivery.  We reviewed the anticipated acute and late sequelae associated with stereotactic radiation in this setting.  The patient was encouraged to ask questions that I answered to the best of my ability. The patient would like to proceed with radiation and will be scheduled for CT simulation tomorrow 07/24/16.  I spent 60 minutes face to face with the patient and more than 50% of that time was spent in counseling and/or coordination of care.   ------------------------------------------------   Tyler Pita, MD [place] Director and Director of Stereotactic Radiosurgery Direct Dial: [phone]  Fax:  [phone] [email or web address]  Skype  [email or web address]  This document serves as a record of services personally performed by Tyler Pita, MD. It was created on his behalf by Maryla Morrow, a trained medical scribe. The creation of this record is based on the scribe's personal observations and the provider's statements to them. This document has been checked and approved by the attending provider.

## 2016-07-23 NOTE — Progress Notes (Signed)
  Radiation Oncology         ([phone]) [phone] ________________________________  Name: Ruben Mccormick MRN: [id number]  Date: 07/24/2016  DOB: 1956-01-19  STEREOTACTIC BODY RADIOTHERAPY SIMULATION AND TREATMENT PLANNING NOTE    ICD-9-CM ICD-10-CM   1. 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  Identity was confirmed.  All relevant records and images related to the planned course of therapy were reviewed.  The patient freely provided informed written consent to proceed with treatment after reviewing the details related to the planned course of therapy. The consent form was witnessed and verified by the simulation staff.  Then, the patient was set-up in a stable reproducible  supine position for radiation therapy.  A BodyFix immobilization pillow was fabricated for reproducible positioning.  Then I personally applied the abdominal compression paddle to limit respiratory excursion.  4D respiratoy motion management CT images were obtained.  Surface markings were placed.  The CT images were loaded into the planning software.  Then, using Cine, MIP, and standard views, the internal target volume (ITV) and planning target volumes (PTV) were delinieated, and avoidance structures were contoured.  Treatment planning then occurred.  The radiation prescription was entered and confirmed.  A total of two complex treatment devices were fabricated in the form of the BodyFix immobilization pillow and a neck accuform cushion.  I have requested : 3D Simulation  I have requested a DVH of the following structures: Heart, Lungs, Esophagus, Chest Wall, Brachial Plexus, Major Blood Vessels, and targets.  SPECIAL TREATMENT PROCEDURE:  The planned course of therapy using radiation constitutes a special treatment procedure. Special care is required  in the management of this patient for the following reasons. This treatment constitutes a Special Treatment Procedure for the following reason: [ High dose per fraction requiring special monitoring for increased toxicities of treatment including daily imaging..  The special nature of the planned course of radiotherapy will require increased physician supervision and oversight to ensure patient's safety with optimal treatment outcomes.  RESPIRATORY MOTION MANAGEMENT SIMULATION:  In order to account for effect of respiratory motion on target structures and other organs in the planning and delivery of radiotherapy, this patient underwent respiratory motion management simulation.  To accomplish this, when the patient was brought to the CT simulation planning suite, 4D respiratoy motion management CT images were obtained.  The CT images were loaded into the planning software.  Then, using a variety of tools including Cine, MIP, and standard views, the target volume and planning target volumes (PTV) were delineated.  Avoidance structures were contoured.  Treatment planning then occurred.  Dose volume histograms were generated and reviewed for each of the requested structure.  The resulting plan was carefully reviewed and approved today.  PLAN:  The patient will receive 54 Gy in 3 fraction.  ________________________________  Sheral Apley Tammi Klippel, M.D.

## 2016-07-23 NOTE — Progress Notes (Signed)
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 Burr Medico by Dr. Melford Aase (PCP) after CT scan for diarrhea revealed multiple liver lesions concerning for malignancy.  Lung pathology:  Liver pathology:   Tobacco/Marijuana/Snuff/ETOH use: former smoker quit February 2017  Past/Anticipated interventions by cardiothoracic surgery, if any: no  Past/Anticipated interventions by medical oncology, if any: Sandostatin injections every 4 weeks   Signs/Symptoms  Weight changes, if any: yes, 25 lb in the last 4-5 months. Reports drinking one boost per day and small frequency meals.   Respiratory complaints, if any: [place] mild dyspnea. Patient denies cough but, cough noted with audible congestion.   Hemoptysis, if any: no  Pain issues, if any:  no  SAFETY ISSUES:  Prior radiation? no  Pacemaker/ICD?  no  Possible current pregnancy?no  Is the patient on methotrexate? no  Current Complaints / other details:  60 year old male. Fatigued. Sleeps "alot." Father had mesothelioma. Referred for consideration of SBRT.

## 2016-07-24 ENCOUNTER — Ambulatory Visit
Admission: RE | Admit: 2016-07-24 | Discharge: 2016-07-24 | Disposition: A | Discharge status: Home / Self Care | Payer: Commercial Managed Care - PPO | Source: Ambulatory Visit | Attending: Radiation Oncology | Admitting: Radiation Oncology

## 2016-07-24 ENCOUNTER — Ambulatory Visit: Payer: Commercial Managed Care - PPO

## 2016-07-24 DIAGNOSIS — Z510 Encounter for antineoplastic radiation therapy: Secondary | ICD-10-CM | POA: Diagnosis present

## 2016-07-24 DIAGNOSIS — Z87442 Personal history of urinary calculi: Secondary | ICD-10-CM | POA: Diagnosis not present

## 2016-07-24 DIAGNOSIS — J449 Chronic obstructive pulmonary disease, unspecified: Secondary | ICD-10-CM | POA: Diagnosis not present

## 2016-07-24 DIAGNOSIS — E559 Vitamin D deficiency, unspecified: Secondary | ICD-10-CM | POA: Diagnosis not present

## 2016-07-24 DIAGNOSIS — E785 Hyperlipidemia, unspecified: Secondary | ICD-10-CM | POA: Diagnosis not present

## 2016-07-24 DIAGNOSIS — I10 Essential (primary) hypertension: Secondary | ICD-10-CM | POA: Diagnosis not present

## 2016-07-24 DIAGNOSIS — R7303 Prediabetes: Secondary | ICD-10-CM | POA: Diagnosis not present

## 2016-07-24 DIAGNOSIS — Z7951 Long term (current) use of inhaled steroids: Secondary | ICD-10-CM | POA: Diagnosis not present

## 2016-07-24 DIAGNOSIS — E291 Testicular hypofunction: Secondary | ICD-10-CM | POA: Diagnosis not present

## 2016-07-24 DIAGNOSIS — E340 Carcinoid syndrome: Secondary | ICD-10-CM | POA: Diagnosis not present

## 2016-07-24 DIAGNOSIS — Z888 Allergy status to other drugs, medicaments and biological substances status: Secondary | ICD-10-CM | POA: Diagnosis not present

## 2016-07-24 DIAGNOSIS — Z79899 Other long term (current) drug therapy: Secondary | ICD-10-CM | POA: Diagnosis not present

## 2016-07-24 DIAGNOSIS — Z7982 Long term (current) use of aspirin: Secondary | ICD-10-CM | POA: Diagnosis not present

## 2016-07-24 DIAGNOSIS — Z87891 Personal history of nicotine dependence: Secondary | ICD-10-CM | POA: Diagnosis not present

## 2016-07-24 DIAGNOSIS — C3431 Malignant neoplasm of lower lobe, right bronchus or lung: Secondary | ICD-10-CM | POA: Diagnosis not present

## 2016-07-29 ENCOUNTER — Ambulatory Visit: Payer: Commercial Managed Care - PPO | Admitting: Radiation Oncology

## 2016-07-29 DIAGNOSIS — Z510 Encounter for antineoplastic radiation therapy: Secondary | ICD-10-CM | POA: Diagnosis not present

## 2016-07-31 ENCOUNTER — Ambulatory Visit
Admission: RE | Admit: 2016-07-31 | Discharge: 2016-07-31 | Disposition: A | Discharge status: Home / Self Care | Payer: Commercial Managed Care - PPO | Source: Ambulatory Visit | Attending: Radiation Oncology | Admitting: Radiation Oncology

## 2016-07-31 DIAGNOSIS — Z510 Encounter for antineoplastic radiation therapy: Secondary | ICD-10-CM | POA: Diagnosis not present

## 2016-08-03 LAB — ACID FAST CULTURE WITH REFLEXED SENSITIVITIES (MYCOBACTERIA): Acid Fast Culture: NEGATIVE

## 2016-08-04 ENCOUNTER — Ambulatory Visit
Admission: RE | Admit: 2016-08-04 | Discharge: 2016-08-04 | Disposition: A | Discharge status: Home / Self Care | Payer: Commercial Managed Care - PPO | Source: Ambulatory Visit | Attending: Radiation Oncology | Admitting: Radiation Oncology

## 2016-08-04 DIAGNOSIS — Z510 Encounter for antineoplastic radiation therapy: Secondary | ICD-10-CM | POA: Diagnosis not present

## 2016-08-05 ENCOUNTER — Ambulatory Visit: Payer: Commercial Managed Care - PPO | Admitting: Radiation Oncology

## 2016-08-06 ENCOUNTER — Ambulatory Visit
Admission: RE | Admit: 2016-08-06 | Discharge: 2016-08-06 | Disposition: A | Discharge status: Home / Self Care | Payer: Commercial Managed Care - PPO | Source: Ambulatory Visit | Attending: Radiation Oncology | Admitting: Radiation Oncology

## 2016-08-06 ENCOUNTER — Encounter: Payer: Self-pay | Admitting: Radiation Oncology

## 2016-08-06 VITALS — BP 132/90 | HR 73 | Temp 97.6°F | Ht 67.0 in | Wt 147.4 lb

## 2016-08-06 DIAGNOSIS — Z510 Encounter for antineoplastic radiation therapy: Secondary | ICD-10-CM | POA: Diagnosis not present

## 2016-08-06 DIAGNOSIS — C3431 Malignant neoplasm of lower lobe, right bronchus or lung: Secondary | ICD-10-CM

## 2016-08-06 NOTE — Progress Notes (Addendum)
Department of Radiation Oncology  Phone:  [phone] Fax:        [phone]  Weekly Treatment Note    Name: Ruben Mccormick Date: 08/06/2016 MRN: [id number] DOB: 10/01/1955   Current dose: 54 Gy  Current fraction:3   MEDICATIONS: Current Outpatient Prescriptions  Medication Sig Dispense Refill  . aspirin 325 MG tablet Take 325 mg by mouth daily.    . Aspirin-Acetaminophen-Caffeine (GOODY HEADACHE PO) Take 1 packet by mouth daily as needed (headaches).    . B-D 3CC LUER-LOK SYR 21GX1" 21G X 1" 3 ML MISC USE AS DIRECTED FOR TESTOSTERONE 10 each 0  . Cholecalciferol (VITAMIN D-3) 5000 UNITS TABS Take 5,000 Units by mouth daily.     . Eluxadoline (VIBERZI) 100 MG TABS Take 1 tablet by mouth 2 (two) times daily. (Patient taking differently: Take 1 tablet by mouth daily. ) 180 tablet 0  . fluticasone (FLONASE) 50 MCG/ACT nasal spray INSTILL 2 SPRAYS INTO THE NOSTRILS AT NIGHT AS NEEDED FOR CONGESTION  5  . hydrochlorothiazide (HYDRODIURIL) 25 MG tablet Take 1 tablet (25 mg total) by mouth daily. 90 tablet 0  . Multiple Vitamin (MULTIVITAMIN WITH MINERALS) TABS tablet Take 1 tablet by mouth daily.    Marland Kitchen PREVIDENT 5000 BOOSTER PLUS 1.1 % PSTE Take 1 application by mouth daily.   3  . Telotristat Etiprate (XERMELO) 250 MG TABS Take 250 mg by mouth 3 (three) times daily with meals. 90 tablet 2  . testosterone cypionate (DEPOTESTOSTERONE CYPIONATE) 200 MG/ML injection INJECT 2 ML INTO THE MUSCLE EVERY 2 WEEKS 10 mL 5  . Triamcinolone Acetonide (TRIAMCINOLONE 0.1 % CREAM : EUCERIN) CREA Apply 1 application topically 2 (two) times daily. 1 each 11  . valACYclovir (VALTREX) 500 MG tablet TAKE 1 TABLET BY MOUTH TWICE DAILY AS NEEDED FOR FEVER BLISTER 90 tablet 1   No current facility-administered medications for this encounter.      ALLERGIES: Bee venom; Ppd [tuberculin purified protein derivative]; and Voltaren [diclofenac sodium]   LABORATORY DATA:  Lab Results  Component Value Date   WBC 5.3 07/10/2016   HGB 14.7 07/10/2016   HCT 46.6 07/10/2016   MCV 95.1 07/10/2016   PLT 183 07/10/2016   Lab Results  Component Value Date   NA 140 06/22/2016   K 4.6 06/22/2016   CL 101 06/08/2016   CO2 29 06/22/2016   Lab Results  Component Value Date   ALT 24 06/22/2016   AST 27 06/22/2016   ALKPHOS 255 (H) 06/22/2016   BILITOT 0.72 06/22/2016     NARRATIVE: Ruben Mccormick was seen today for weekly treatment management. The chart was checked and the patient's films were reviewed.  Ruben Mccormick presents after completion of 3 SBRT treatments to his Right Lung. He denies pain. He reports decreased energy. He does have a non-productive cough today, which he notices has occurred after his treatments and it comes and goes. He reports he has a decreased appetite. Reports a "tickle" in the back of his throat, but no pain/soreness there.  PHYSICAL EXAMINATION: height is '5\' 7"'$  (1.702 m) and weight is 147 lb 6.4 oz (66.9 kg). His temperature is 97.6 F (36.4 C). His blood pressure is 132/90 and his pulse is 73. His oxygen saturation is 100%.  Alert and oriented x3. Normal respiratory effort.  ASSESSMENT: The patient did satisfactorily with treatment.  PLAN: The patient will follow-up in our clinic in 1 month. He has a follow up with Dr. Burr Medico on 08/21/16.  ------------------------------------------------  [name]  S. Lisbeth Renshaw, MD, PhD  This document serves as a record of services personally performed by Kyung Rudd, MD. It was created on his behalf by Darcus Austin, a trained medical scribe. The creation of this record is based on the scribe's personal observations and the provider's statements to them. This document has been checked and approved by the attending provider.

## 2016-08-06 NOTE — Progress Notes (Signed)
Mr. Rawles presents after completion of 3 SBRT treatments to his Right Lung. He denies pain. He reports decreased energy. He does have a cough today, which he notices has occurred after his treatments. He does have a cough that comes and goes. He tells me that it is non-productive. He reports he has a decreased appetite. He has no other concerns at this time.   BP 132/90   Pulse 73   Temp 97.6 F (36.4 C)   Ht '5\' 7"'$  (1.702 m)   Wt 147 lb 6.4 oz (66.9 kg)   SpO2 100% Comment: room air  BMI 23.09 kg/m    Wt Readings from Last 3 Encounters:  08/06/16 147 lb 6.4 oz (66.9 kg)  07/23/16 147 lb 3.2 oz (66.8 kg)  07/17/16 152 lb 14.4 oz (69.4 kg)

## 2016-08-07 ENCOUNTER — Ambulatory Visit: Payer: Commercial Managed Care - PPO | Admitting: Radiation Oncology

## 2016-08-11 ENCOUNTER — Ambulatory Visit: Payer: Commercial Managed Care - PPO | Admitting: Radiation Oncology

## 2016-08-13 ENCOUNTER — Ambulatory Visit: Payer: Commercial Managed Care - PPO | Admitting: Radiation Oncology

## 2016-08-20 ENCOUNTER — Other Ambulatory Visit: Payer: Self-pay | Admitting: Hematology

## 2016-08-20 ENCOUNTER — Encounter (HOSPITAL_COMMUNITY)
Admission: RE | Admit: 2016-08-20 | Discharge: 2016-08-20 | Disposition: A | Discharge status: Home / Self Care | Payer: Commercial Managed Care - PPO | Source: Ambulatory Visit | Attending: Hematology | Admitting: Hematology

## 2016-08-20 DIAGNOSIS — C7B02 Secondary carcinoid tumors of liver: Secondary | ICD-10-CM

## 2016-08-21 ENCOUNTER — Telehealth: Payer: Self-pay | Admitting: Hematology

## 2016-08-21 ENCOUNTER — Ambulatory Visit (HOSPITAL_BASED_OUTPATIENT_CLINIC_OR_DEPARTMENT_OTHER): Payer: Commercial Managed Care - PPO | Admitting: Hematology

## 2016-08-21 ENCOUNTER — Other Ambulatory Visit (HOSPITAL_BASED_OUTPATIENT_CLINIC_OR_DEPARTMENT_OTHER): Payer: Commercial Managed Care - PPO

## 2016-08-21 ENCOUNTER — Ambulatory Visit (HOSPITAL_BASED_OUTPATIENT_CLINIC_OR_DEPARTMENT_OTHER): Payer: Commercial Managed Care - PPO

## 2016-08-21 VITALS — BP 115/98 | HR 100 | Temp 97.9°F | Resp 17 | Ht 67.0 in | Wt 157.2 lb

## 2016-08-21 DIAGNOSIS — Z7189 Other specified counseling: Secondary | ICD-10-CM

## 2016-08-21 DIAGNOSIS — C7A00 Malignant carcinoid tumor of unspecified site: Secondary | ICD-10-CM

## 2016-08-21 DIAGNOSIS — C7B03 Secondary carcinoid tumors of bone: Secondary | ICD-10-CM

## 2016-08-21 DIAGNOSIS — R197 Diarrhea, unspecified: Secondary | ICD-10-CM

## 2016-08-21 DIAGNOSIS — C3491 Malignant neoplasm of unspecified part of right bronchus or lung: Secondary | ICD-10-CM | POA: Diagnosis not present

## 2016-08-21 DIAGNOSIS — C7B02 Secondary carcinoid tumors of liver: Secondary | ICD-10-CM

## 2016-08-21 DIAGNOSIS — R21 Rash and other nonspecific skin eruption: Secondary | ICD-10-CM

## 2016-08-21 DIAGNOSIS — E340 Carcinoid syndrome: Secondary | ICD-10-CM | POA: Diagnosis not present

## 2016-08-21 DIAGNOSIS — C3431 Malignant neoplasm of lower lobe, right bronchus or lung: Secondary | ICD-10-CM

## 2016-08-21 LAB — COMPREHENSIVE METABOLIC PANEL
ALBUMIN: 3.2 g/dL — AB (ref 3.5–5.0)
ALK PHOS: 259 U/L — AB (ref 40–150)
ALT: 19 U/L (ref 0–55)
ANION GAP: 7 meq/L (ref 3–11)
AST: 30 U/L (ref 5–34)
BILIRUBIN TOTAL: 0.86 mg/dL (ref 0.20–1.20)
BUN: 13.6 mg/dL (ref 7.0–26.0)
CALCIUM: 9.2 mg/dL (ref 8.4–10.4)
CO2: 32 meq/L — AB (ref 22–29)
CREATININE: 0.9 mg/dL (ref 0.7–1.3)
Chloride: 102 mEq/L (ref 98–109)
Glucose: 131 mg/dl (ref 70–140)
Potassium: 4.5 mEq/L (ref 3.5–5.1)
Sodium: 142 mEq/L (ref 136–145)
TOTAL PROTEIN: 6.2 g/dL — AB (ref 6.4–8.3)

## 2016-08-21 LAB — CBC WITH DIFFERENTIAL/PLATELET
BASO%: 0.9 % (ref 0.0–2.0)
Basophils Absolute: 0.1 10*3/uL (ref 0.0–0.1)
EOS ABS: 0.1 10*3/uL (ref 0.0–0.5)
EOS%: 1.8 % (ref 0.0–7.0)
HEMATOCRIT: 47.9 % (ref 38.4–49.9)
HGB: 15.2 g/dL (ref 13.0–17.1)
LYMPH#: 1 10*3/uL (ref 0.9–3.3)
LYMPH%: 15.5 % (ref 14.0–49.0)
MCH: 31 pg (ref 27.2–33.4)
MCHC: 31.7 g/dL — ABNORMAL LOW (ref 32.0–36.0)
MCV: 97.6 fL (ref 79.3–98.0)
MONO#: 0.7 10*3/uL (ref 0.1–0.9)
MONO%: 10.5 % (ref 0.0–14.0)
NEUT%: 71.3 % (ref 39.0–75.0)
NEUTROS ABS: 4.4 10*3/uL (ref 1.5–6.5)
PLATELETS: 167 10*3/uL (ref 140–400)
RBC: 4.91 10*6/uL (ref 4.20–5.82)
RDW: 17.2 % — ABNORMAL HIGH (ref 11.0–14.6)
WBC: 6.2 10*3/uL (ref 4.0–10.3)

## 2016-08-21 MED ORDER — TELOTRISTAT ETHYL(AS ETIPRATE) 250 MG PO TABS: 250.0000 mg | ORAL_TABLET | Freq: Three times a day (TID) | ORAL | 2 refills | Status: AC

## 2016-08-21 MED ORDER — OCTREOTIDE ACETATE 30 MG IM KIT
30.0000 mg | PACK | Freq: Once | INTRAMUSCULAR | Status: AC
  Administered 2016-08-21: 30 mg via INTRAMUSCULAR
  Filled 2016-08-21: qty 1

## 2016-08-21 NOTE — Progress Notes (Signed)
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 No evidence of mediastinal node metastasis or distant metastasis. Groundglass nodule in the right upper lobe is indeterminate 8. No metabolic activity associated with the enhancing hepatic lesion in left and right hepatic lobes, fever up benign etiology or hemangiomas, but malignancy is not ruled out.      05/24/2016 Imaging    Abdomen MRI with and without contrast showed multiple enhancing liver lesions, the largest in the inferior right liver shows central necrosis, suspicious for malignancy.      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       06/11/2016 Procedure    Patient underwent EGD and colonoscopy by Dr. Ardis Mccormick, no significant tumor, to the ileum and distal stomach biopsy showed inflammatory change, no evidence of carcinoid. One polyp in ascending colon was removed, showed tubular adenoma.      06/22/2016 -  Chemotherapy    Sandostatin injections every 4 weeks        Adenocarcinoma of right lower lobe of lung ([place])   05/15/2016 Imaging    PET scan showed: 1. Intense metabolic  activity associated with the RIGHT lower lobe pulmonary nodule is concerning for bronchogenic carcinoma. No evidence of mediastinal nodal metastasis or distant metastasis (potential stage IA). 2. Ground-glass nodule RIGHT upper lobe is indeterminate. This lesion will require follow-uphowed       06/19/2016 Procedure    Bronchoscopy biopsy of the right lung mass was negative for malignancy      07/10/2016 Initial Diagnosis    Adenocarcinoma of right lung ([place])      07/10/2016 Procedure    CT-guided right lung mass biopsy showed non-small cell carcinoma, immunostaining was strongly positive for TTF-1, negative for CK 5/6, consistent with primary lung adenocarcinoma       HISTORY OF PRESENTING ILLNESS (05/15/2016):     Ruben Mccormick 61 y.o. male with past medical history of hepatitis B, hypertension, heavy smoking, is here because of his recent abnormal CT scan, which showed multiple liver lesions concerning for malignancy. He was referred by his primary care physician Dr. Melford Mccormick.    He has had diarrhea for 1.5 years, it has been getting worse lately, up 20 times a day. He was seen by Dr. Melford Mccormick and started taking Viberzi in 02/2016 which helps a lot. He underwent abdominal ultrasound, which showed a large lesion in the right lobe of the liver. He subsequently underwent abdominal and pelvis CT with contrast, which showed a dominant 7.6 cm necrotic mass in the inferior right lobe, and multiple other small lesions in the liver.   His last colonoscopy was 5 years ago which  was normal per patient. He denies significant abdominal pain or discomfort, no melano or hemachizia, but he has mild heart burn and gassy feeling from [employer]. No other symptoms. He has chronic mild dyspnia from smoking. He lost about 25 lbs in the past 4-5 months. 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   He lives with his wife, has two daughters and  one son. His previous hepatitis B test showed positive S antigen and E antibody, hepatitis C negative. Patient is on not aware of his prior hepatitis B infection. He did use cocaine, but no IV drugs. No history of blood transfusion.  CURRENT THERAPY:  somatostatin injection every 4 weeks, started on 06/22/2016  INTERIM HISTORY  Mr. Ruben Mccormick returns for follow-up and Sandostatin injection. He received 3 fractions of SBRT to his lung cancer last month, and tolerated the treatment very well. He complains of abdominal discomfort, which he feels possibly related to Ruben Mccormick which he takes for his diarrhea. I prescribed Xermelp for him last month but he did not receive the medication. He still has loose bowel movement 3-5 times a day, he also reports moderate fatigue, he is able to work full-time, but feels exhausted after work. His appetite is decent, he gained 5 pounds in the past month. No other new complaints.  MEDICAL HISTORY:  Past Medical History:  Diagnosis Date  . History of kidney stones   . Hyperlipidemia   . Hypertension   . Hypogonadism male   . Lung cancer ([place])   . Pre-diabetes   . Vitamin D deficiency     SURGICAL HISTORY: Past Surgical History:  Procedure Laterality Date  . APPENDECTOMY    . COLONOSCOPY WITH PROPOFOL N/A 06/11/2016   Procedure: COLONOSCOPY WITH PROPOFOL;  Surgeon: Ruben Banister, MD;  Location: [facility];  Service: Endoscopy;  Laterality: N/A;  . ESOPHAGOGASTRODUODENOSCOPY (EGD) WITH PROPOFOL N/A 06/11/2016   Procedure: ESOPHAGOGASTRODUODENOSCOPY (EGD) WITH PROPOFOL;  Surgeon: Ruben Banister, MD;  Location: [facility];  Service: Endoscopy;  Laterality: N/A;  . VIDEO BRONCHOSCOPY WITH ENDOBRONCHIAL NAVIGATION Right 06/19/2016   Procedure: VIDEO BRONCHOSCOPY WITH ENDOBRONCHIAL NAVIGATION with RLL brushings, washings and bx;  Surgeon: Ruben Noel, MD;  Location: [facility];  Service: Thoracic;  Laterality: Right;    SOCIAL HISTORY: Social History   Social  History  . Marital status: Married    Spouse name: N/A  . Number of children: N/A  . Years of education: N/A   Occupational History  . Not on file.   Social History Main Topics  . Smoking status: Former Smoker    Packs/day: 1.00    Years: 40.00    Types: Cigarettes    Quit date: 09/11/2015  . Smokeless tobacco: Never Used  . Alcohol use 16.8 oz/week    14 Shots of liquor, 14 Standard drinks or equivalent per week     Comment: 2 drinks daily since young   . Drug use: No     Comment: cocaine in the past, no iv drugs   . Sexual activity: Not on file   Other Topics Concern  . Not on file   Social History Narrative  . No narrative on file    FAMILY HISTORY: Family History  Problem Relation Age of Onset  . Diabetes Father   . Cancer Father     mesothelioma   . Alzheimer's disease Mother   . Parkinson's disease Mother   .  Cancer Paternal Grandfather     colon    ALLERGIES:  is allergic to bee venom; ppd [tuberculin purified protein derivative]; and voltaren [diclofenac sodium].  MEDICATIONS:  Current Outpatient Prescriptions  Medication Sig Dispense Refill  . aspirin 325 MG tablet Take 325 mg by mouth daily.    . Aspirin-Acetaminophen-Caffeine (GOODY HEADACHE PO) Take 1 packet by mouth daily as needed (headaches).    . B-D 3CC LUER-LOK SYR 21GX1" 21G X 1" 3 ML MISC USE AS DIRECTED FOR TESTOSTERONE 10 each 0  . Cholecalciferol (VITAMIN D-3) 5000 UNITS TABS Take 5,000 Units by mouth daily.     . Eluxadoline (VIBERZI) 100 MG TABS Take 1 tablet by mouth 2 (two) times daily. (Patient taking differently: Take 1 tablet by mouth daily. ) 180 tablet 0  . fluticasone (FLONASE) 50 MCG/ACT nasal spray INSTILL 2 SPRAYS INTO THE NOSTRILS AT NIGHT AS NEEDED FOR CONGESTION  5  . hydrochlorothiazide (HYDRODIURIL) 25 MG tablet Take 1 tablet (25 mg total) by mouth daily. 90 tablet 0  . Multiple Vitamin (MULTIVITAMIN WITH MINERALS) TABS tablet Take 1 tablet by mouth daily.    Marland Kitchen PREVIDENT  5000 BOOSTER PLUS 1.1 % PSTE Take 1 application by mouth daily.   3  . Telotristat Etiprate (XERMELO) 250 MG TABS Take 250 mg by mouth 3 (three) times daily with meals. 90 tablet 2  . testosterone cypionate (DEPOTESTOSTERONE CYPIONATE) 200 MG/ML injection INJECT 2 ML INTO THE MUSCLE EVERY 2 WEEKS 10 mL 5  . Triamcinolone Acetonide (TRIAMCINOLONE 0.1 % CREAM : EUCERIN) CREA Apply 1 application topically 2 (two) times daily. 1 each 11  . valACYclovir (VALTREX) 500 MG tablet TAKE 1 TABLET BY MOUTH TWICE DAILY AS NEEDED FOR FEVER BLISTER 90 tablet 1   No current facility-administered medications for this visit.     REVIEW OF SYSTEMS:   Constitutional: Denies fevers, chills or abnormal night sweats (+) fatigue Eyes: Denies blurriness of vision, double vision or watery eyes Ears, nose, mouth, throat, and face: Denies mucositis or sore throat Respiratory: Denies cough, dyspnea or wheezes Cardiovascular: Denies palpitation, chest discomfort or lower extremity swelling Gastrointestinal:  Denies nausea, heartburn or change in bowel habits Skin: Denies abnormal skin rashes (+) dry, itchy bumps on skin over abdomen and back Lymphatics: Denies new lymphadenopathy or easy bruising Neurological:Denies numbness, tingling or new weaknesses Behavioral/Psych: Mood is stable, no new changes  All other systems were reviewed with the patient and are negative.  PHYSICAL EXAMINATION: ECOG PERFORMANCE STATUS: 1 - Symptomatic but completely ambulatory  Vitals:   08/21/16 1523  BP: (!) 115/98  Pulse: 100  Resp: 17  Temp: 97.9 F (36.6 C)   Filed Weights   08/21/16 1523  Weight: 157 lb 3.2 oz (71.3 kg)    GENERAL:alert, no distress and comfortable SKIN: skin color, texture, turgor are normal, no rashes or significant lesions (+) very dry skin and sporadic scaly rash with scratch markers over abdomen and back EYES: normal, conjunctiva are pink and non-injected, sclera clear OROPHARYNX:no exudate, no  erythema and lips, buccal mucosa, and tongue normal  NECK: supple, thyroid normal size, non-tender, without nodularity LYMPH:  no palpable lymphadenopathy in the cervical, axillary or inguinal LUNGS: clear to auscultation and percussion with normal breathing effort HEART: regular rate & rhythm and no murmurs and no lower extremity edema ABDOMEN:abdomen soft, non-tender and normal bowel sounds, no organomegaly. Rectal exam was negative for palpable mass, no blood on the tip of gloves   Musculoskeletal:no cyanosis of digits and  no clubbing  PSYCH: alert & oriented x 3 with fluent speech NEURO: no focal motor/sensory deficits  LABORATORY DATA:  I have reviewed the data as listed CBC Latest Ref Rng & Units 07/10/2016 06/22/2016 06/19/2016  WBC 4.0 - 10.5 K/uL 5.3 6.3 -  Hemoglobin 13.0 - 17.0 g/dL 14.7 14.8 15.6  Hematocrit 39.0 - 52.0 % 46.6 46.8 46.0  Platelets 150 - 400 K/uL 183 206 -   CMP Latest Ref Rng & Units 06/22/2016 06/19/2016 06/19/2016  Glucose 70 - 140 mg/dl 167(H) - -  BUN 7.0 - 26.0 mg/dL 10.0 - -  Creatinine 0.7 - 1.3 mg/dL 0.8 - -  Sodium 136 - 145 mEq/L 140 138 140  Potassium 3.5 - 5.1 mEq/L 4.6 4.2 3.6  Chloride 101 - 111 mmol/L - - -  CO2 22 - 29 mEq/L 29 - -  Calcium 8.4 - 10.4 mg/dL 9.2 - -  Total Protein 6.4 - 8.3 g/dL 6.2(L) - -  Total Bilirubin 0.20 - 1.20 mg/dL 0.72 - -  Alkaline Phos 40 - 150 U/L 255(H) - -  AST 5 - 34 U/L 27 - -  ALT 0 - 55 U/L 24 - -   PATHOLOGY REPORT  Diagnosis 06/08/2016 Liver, biopsy, Right hepatic lobe - METASTATIC LOW GRADE NEUROENDOCRINE TUMOR (CARCINOID TUMOR). - SEE COMMENT. Microscopic Comment To help phenotype the tumor, immunohistochemical stains were performed. The tumor cells are positive for synaptophysin, CD56, chromogranin, and NSE. They are negative for arginase, cytokeratin 5/6, cytokeratin 7, Glypican, Hepar-1, and TTF-1. The findings are consistent with metastatic low grade neuroendocrine tumor. Additional  studies can be performed upon clinician request. Dr. Burr Medico was paged on 06/10/16. Dr. Saralyn Pilar has reviewed the case and concurs with this interpretation. (JBK:gt, 06/10/16)  Diagnosis 06/11/2016 1. Ileum, biopsy, terminal - INFLAMMATORY PROLAPSE TYPE POLYP. - NO DYSPLASIA OR MALIGNANCY. 2. Colon, polyp(s), ascending - TUBULAR ADENOMA (X3 FRAGMENTS). - NO HIGH GRADE DYSPLASIA OR MALIGNANCY. 3. Stomach, biopsy, distal - CHRONIC ACTIVE GASTRITIS. - NEGATIVE FOR HELICOBACTER PYLORI. - NO INTESTINAL METAPLASIA, DYSPLASIA, OR MALIGNANCY. - NO EVIDENCE OF CARCINOID. Microscopic Comment 3. A Warthin-Starry stain is performed to determine the possibility of the presence of Helicobacter pylori. The Warthin-Starry stain is negative for organisms of Helicobacter pylori.  Diagnosis 07/10/2016 Lung, needle/core biopsy(ies), Right Lower Lobe - NON SMALL CELL CARCINOMA. Microscopic Comment Immunohistochemistry will be performed and reported as an addendum. (JDP:gt, 07/13/16) Addendum: Immunohistochemistry shows strong positivity with thyroid transcription factor-1 and negative staining with cytokeratin 5/6 and the immunophenotype is consistent with primary lung adenocarcinoma. (JDP:ecj 07/14/2016)  RADIOGRAPHIC STUDIES: I have personally reviewed the radiological images as listed and agreed with the findings in the report. Nm Pet (netspot Ga 68 Dotatate) Skull Base To Mid Thigh  Result Date: 08/20/2016 CLINICAL DATA:  Metastatic neuroendocrine tumor with biopsy-proven liver metastasis. Hypermetabolic lesion in the RIGHT lower lobe. EXAM: NUCLEAR MEDICINE PET SKULL BASE TO THIGH TECHNIQUE: 3.96 mCi Gallium 68 Dotatate (NETSPOT) was injected intravenously. Full-ring PET imaging was performed from the skull base to thigh after the radiotracer. CT data was obtained and used for attenuation correction and anatomic localization. COMPARISON:  CT 06/05/2016, MRI 05/2014 17, FDG PET-CT 05/15/2016. FINDINGS: NECK No  radiotracer activity in neck lymph nodes. CHEST No abnormal radiotracer accumulation within the mediastinal nodes or axilla. No activity associated with nodular lesion in the RIGHT lower lobe which recent biopsy. ABDOMEN/PELVIS Multiple round lesions within the liver parenchyma which have avid accumulation of the radiotracer. Largest lesion in the set  RIGHT hepatic lobe with SUV max equal 16.1 measure approximate 7 cm. Multiple small lesions in LEFT and RIGHT hepatic lobe number approximately 12. For example lesion in the LEFT lateral hepatic lobe with SUV max equal 33 (fused image 118). This corresponds to enhancing lesions on comparison MRI. Subcapsular RIGHT hepatic lobe with SUV max equal 29 (fused image 60). There are multiple sites of radiotracer accumulation within the pancreas body and tail with SUV = 14 in pancreatic body and SUV max equal 8 in pancreatic tail. No discrete lesions identified on CT portion. There is focal uptake at the distal ileum with SUV max equal 24. Subtle soft tissue thickening at this level (image 153, series 4). Physiologic activity noted within the adrenal glands, spleen and kidneys. SKELETON There multiple sites of small intense focal metabolic activity within the skeleton consistent widespread skeletal metastasis. Example lesion in the LEFT iliac wing with SUV max equal 13.7 on image 151. Note clear CT changes. Activity in the pedicle of the RIGHT L3 vertebral body. Focal activity within the sternum, RIGHT ribs, knee and RIGHT skullbase at C1. IMPRESSION: 1. Multiple foci of of somatostatin receptor positive lesions within the liver consistent with metastatic neuroendocrine tumor. Dominant lesion measures 7 cm. Approximately 12 additional smaller lesions. 2. Focal somatostatin receptor avid lesion in the distal ileum is concerning for metastatic lesion or primary lesion. 3. Activity in the pancreas body and tail indeterminate with differential including physiologic uptake versus  NEUROENDOCRINE TUMOR. No lesion identified on comparison MRI. 4. Multiple foci of somatostatin receptor positive tumor within the skeleton including the sternum, ribs, spine and pelvis. Minimal CT correlation. Electronically Signed   By: Suzy Bouchard M.D.   On: 08/20/2016 17:43   EGD 06/11/2016 - Normal esophagus. - Normal examined duodenum. - Abnromal distal stomach, this was somewhat neoplastic appearing, biospied extensively. - The examination was otherwise normal.  Colonoscopy 06/11/2016: One abnormal appearing ileal polypoid leion in the terminal ileum. Biopsied. - Two 8 to 11 mm polyps in the transverse colon and in the ascending colon, removed with a hot snare. Resected and retrieved. - One 5 mm polyp in the transverse colon, removed with a cold snare. Resected and retrieved. - Diverticulosis in the left colon.  ECHO 06/05/2016 - Left ventricle: The cavity size was normal. Left ventricular   diastolic function parameters were normal. - Ventricular septum: The contour showed diastolic flattening.   These changes are consistent with RV volume overload. - Right ventricle: The cavity size was severely dilated. Systolic   function was mildly reduced. - Right atrium: The atrium was severely dilated. - Tricuspid valve: Mobility was restricted. There was malcoaptation   of the valve leaflets. There was wide-open regurgitation. 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  ASSESSMENT & PLAN:  61 y.o. male with past medical history of hypertension, hepatitis B infection, presented with worsening diarrhea for one and half years, fatigue, anorexia and weight loss.  1. 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 I spoke with radiologist Dr. Leonia Reeves and we think his primary carcinoid tumor is probably in the terminal ileum. The PET scan also reviewed multiple bone metastasis, he is not symptomatic from the bone mets.  -I previously  discussed his liver biopsy results, which showed metastatic carcinoid tumor, low-grade -His EGD showed abnormal appearing of the distal stomach, however the biopsy only showed inflammatory change. Colonoscopy was also an negative except polyps, and biopsy was negative for carcinoid tumor. --We previously discussed the nature history of metastatic carcinoid tumor. Due to his diffuse liver and bone metastasis, I do not think he is a candidate for upfront metastasectomy, and his disease is likely incurable. We discussed this is a indolent disease, and most people do well with treatment -the goal of therapy is palliative, to prolong his life and improve his quality of life  -He has started Sandostatin injection 2 months ago, tolerated well, will continue -his diarrhea has not improved much, I will start him on Xermelo '250mg'$  tid. Potential side effects, such as nausea, headache, peripheral edema, abnormal liver functions, anorexia, constipation, etc., were discussed with patient, he agrees to try. I initially prescribed last month,  But he did not see if the medication. I again sent the proscription to our oral pharmacist today  -Although it is hard to compare the DOTATATE-PET to his prior CT and MRI, his dominant liver lesion seems to be stable or slightly improved after 2 months of Sandostatin injection. -I'll plan to repeat the PET scan in 4 months for follow-up. -due to his diffuse bone metastasis, I recommend him to start Xgeva injection next month. Potential side effects, especially jaw necrosis, were discussed with patient. He will schedule his dental cleaning and exam within the next month. -I encouraged him to take calcium and  vitamin D.  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  -will start him on Xermelo for his diarrhea   4. Skin rashes -he has had diffuse skin rashes on his trunk, not sure if it's related to his carcinoid syndrome. It's very itchy, and the bothersome. He has tried over-the-counter and prescription topical steroids, which does not help. -I'll refer him to dermatologist  5. Adenocarcinoma of right lung, cT1N0M0, stage I  -His right lower lobe nodule is intensely hypermetabolic on PET scan, no significant adenopathy or other hypermetabolic lesion suggesting metastasis. Biopsy confirmed non-small cell lung cancer, consistent with adenocarcinoma. -He is s/p SBRT to his lung cancer, will follow up with Dr. Tammi Klippel  6. Goal of care discussion  -We discussed the incurable nature of his metastatic carcinoid tumor, it's very treatable and probably slow growing, but we are not able to cure it. Most pt can liver for several years.  -The patient understands the goal of care is palliative.  Plan -DOTATATE-PET scan reviewed  -Continue lab and Sandostatin injection every 4 weeks -He will start Xgeva next month, he will take a calcium and vitamin D -Dermatology referral -I sent a prescription Xermelo '250mg'$  tid to our oral pharmacist, he will start as well as he receives it. If it controls his diarrhea, he will stop Viberzi -f/u in 2 months   All questions were answered. The patient knows to call the clinic with any problems, questions or concerns.  I spent 35 minutes counseling the patient face to face. The total time spent in the appointment was 40 minutes and more than 50% was on counseling.     Ruben Merle, MD 08/21/2016

## 2016-08-21 NOTE — Patient Instructions (Signed)

## 2016-08-21 NOTE — Progress Notes (Signed)
Faxed script for Telotristat to [facility] as instructed by Denyse Amass, oral chemo pharmacist.

## 2016-08-21 NOTE — Telephone Encounter (Signed)
Appointments scheduled per 1/12 LOS. Patient given AVS report and calendars with future scheduled appointments. Called to set up dermatologist  appointment. LVM to call patient with appointment.

## 2016-08-22 ENCOUNTER — Encounter: Payer: Self-pay | Admitting: Hematology

## 2016-08-22 DIAGNOSIS — Z7189 Other specified counseling: Secondary | ICD-10-CM | POA: Insufficient documentation

## 2016-08-25 LAB — CHROMOGRANIN A: CHROMOGRAN A: 313 nmol/L — AB (ref 0–5)

## 2016-08-26 ENCOUNTER — Telehealth: Payer: Self-pay | Admitting: Pharmacist

## 2016-08-26 NOTE — Telephone Encounter (Signed)
Oral Chemotherapy Pharmacist Encounter  Received notification from [facility] that patient's Ruben Mccormick would require prior authorization to be completed by the office and can not be done by the pharmacy. I called [facility]'s PA department at [phone] and requested PA fax to be sent to Oral oncology fax line. Once this form is received, we will complete and get back to [facility] for PA determination. Once PA is obtained, we will contact [facility] to process prescription.  Johny Drilling, PharmD, BCPS, BCOP 08/26/2016  12:56 PM Oral Oncology Clinic [phone]

## 2016-08-26 NOTE — Telephone Encounter (Signed)
Oral Chemotherapy Pharmacist Encounter  Received Xermelo PA form from [facility]. Clinical questions answered, MD signature obtained, form faxed back to [facility] at [phone]  We will update [facility] once PA is obtained.  Oral Oncology Clinic will continue to follow.  Johny Drilling, PharmD, BCPS, BCOP 08/26/2016  1:48 PM Oral Oncology Clinic [phone]

## 2016-08-28 ENCOUNTER — Telehealth: Payer: Self-pay

## 2016-08-28 NOTE — Telephone Encounter (Signed)
PA for Xermelo received from 08/27/16 ti 08/27/17  Informed [facility] and they ran thru claim for $0 copay and will arrange delivery to patient.  Thank you  Henreitta Leber, PharmD Oral Oncology [facility]

## 2016-08-29 ENCOUNTER — Telehealth: Payer: Self-pay | Admitting: Hematology

## 2016-08-29 NOTE — Telephone Encounter (Signed)
ADDED F/U TO 3/9 LAB/INJ. LEFT MESSAGE FOR PATIENT RE NEW TIME FOR 3*9. PATIENT TO GET NEW SCHEDULE 2/9.

## 2016-08-30 NOTE — Progress Notes (Signed)
  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  Volumetric arc fields were employed to deliver 6 MV X-rays.  Image guidance was performed with per fraction cone beam CT prior to treatment under personal MD supervision.  Immobilization was achieved using BodyFix Pillow.  Narrative: The patient tolerated radiation treatment relatively well.     Plan: The patient has completed radiation treatment. The patient will return to radiation oncology clinic for routine followup in one month. I advised them to call or return sooner if they have any questions or concerns related to their recovery or treatment. ________________________________  Sheral Apley. Tammi Klippel, M.D.

## 2016-09-08 ENCOUNTER — Ambulatory Visit: Payer: Self-pay | Admitting: Radiation Oncology

## 2016-09-18 ENCOUNTER — Telehealth: Payer: Self-pay | Admitting: *Deleted

## 2016-09-18 ENCOUNTER — Other Ambulatory Visit (HOSPITAL_BASED_OUTPATIENT_CLINIC_OR_DEPARTMENT_OTHER): Payer: Commercial Managed Care - PPO

## 2016-09-18 ENCOUNTER — Ambulatory Visit (HOSPITAL_BASED_OUTPATIENT_CLINIC_OR_DEPARTMENT_OTHER): Payer: Commercial Managed Care - PPO

## 2016-09-18 VITALS — BP 135/92 | HR 66 | Temp 97.5°F | Resp 18

## 2016-09-18 DIAGNOSIS — E340 Carcinoid syndrome: Secondary | ICD-10-CM

## 2016-09-18 DIAGNOSIS — C7B02 Secondary carcinoid tumors of liver: Secondary | ICD-10-CM

## 2016-09-18 DIAGNOSIS — C7B03 Secondary carcinoid tumors of bone: Secondary | ICD-10-CM | POA: Diagnosis not present

## 2016-09-18 DIAGNOSIS — C7A00 Malignant carcinoid tumor of unspecified site: Secondary | ICD-10-CM

## 2016-09-18 LAB — CBC WITH DIFFERENTIAL/PLATELET
BASO%: 1 % (ref 0.0–2.0)
Basophils Absolute: 0 10*3/uL (ref 0.0–0.1)
EOS ABS: 0.1 10*3/uL (ref 0.0–0.5)
EOS%: 1.9 % (ref 0.0–7.0)
HEMATOCRIT: 43.5 % (ref 38.4–49.9)
HEMOGLOBIN: 14 g/dL (ref 13.0–17.1)
LYMPH#: 0.8 10*3/uL — AB (ref 0.9–3.3)
LYMPH%: 22.2 % (ref 14.0–49.0)
MCH: 31.3 pg (ref 27.2–33.4)
MCHC: 32.1 g/dL (ref 32.0–36.0)
MCV: 97.5 fL (ref 79.3–98.0)
MONO#: 0.5 10*3/uL (ref 0.1–0.9)
MONO%: 12.8 % (ref 0.0–14.0)
NEUT%: 62.1 % (ref 39.0–75.0)
NEUTROS ABS: 2.3 10*3/uL (ref 1.5–6.5)
PLATELETS: 180 10*3/uL (ref 140–400)
RBC: 4.46 10*6/uL (ref 4.20–5.82)
RDW: 16.9 % — AB (ref 11.0–14.6)
WBC: 3.8 10*3/uL — AB (ref 4.0–10.3)

## 2016-09-18 LAB — COMPREHENSIVE METABOLIC PANEL
ALBUMIN: 3 g/dL — AB (ref 3.5–5.0)
ALK PHOS: 361 U/L — AB (ref 40–150)
ALT: 26 U/L (ref 0–55)
ANION GAP: 7 meq/L (ref 3–11)
AST: 47 U/L — ABNORMAL HIGH (ref 5–34)
BILIRUBIN TOTAL: 0.49 mg/dL (ref 0.20–1.20)
BUN: 12.9 mg/dL (ref 7.0–26.0)
CO2: 34 mEq/L — ABNORMAL HIGH (ref 22–29)
Calcium: 8.7 mg/dL (ref 8.4–10.4)
Chloride: 100 mEq/L (ref 98–109)
Creatinine: 0.8 mg/dL (ref 0.7–1.3)
GLUCOSE: 94 mg/dL (ref 70–140)
Potassium: 4.5 mEq/L (ref 3.5–5.1)
SODIUM: 141 meq/L (ref 136–145)
TOTAL PROTEIN: 6.2 g/dL — AB (ref 6.4–8.3)

## 2016-09-18 MED ORDER — OCTREOTIDE ACETATE 30 MG IM KIT
30.0000 mg | PACK | Freq: Once | INTRAMUSCULAR | Status: AC
  Administered 2016-09-18: 30 mg via INTRAMUSCULAR
  Filled 2016-09-18: qty 1

## 2016-09-18 NOTE — Telephone Encounter (Signed)
Was informed by Verdis Frederickson in the lab dept that 24 hr urine collection that pt brought in today will be rejected - due to pt kept it for 2 days instead of  24 hr .  Called pt on cell phone and left message on voice mail requesting a call back to collaborative nurse.

## 2016-09-18 NOTE — Progress Notes (Signed)
Pt stated he had been unable to see the dentist  Before appointment today. Pt stated he has dental appointment on 09/21/2016. Will hold Xgeva until dental evaluation is completed.

## 2016-09-18 NOTE — Patient Instructions (Signed)

## 2016-09-22 ENCOUNTER — Telehealth: Payer: Self-pay | Admitting: *Deleted

## 2016-09-22 LAB — CHROMOGRANIN A: CHROMOGRAN A: 97 nmol/L — AB (ref 0–5)

## 2016-09-22 NOTE — Telephone Encounter (Signed)
Received fax from [facility] with confirmation of xermelo prescription shipment on 09/21/16.

## 2016-09-24 LAB — 5 HIAA, QUANTITATIVE, URINE, 24 HOUR
5-HIAA, URINE: 21.8 mg/L
5-HIAA,Quant.,24 Hr Urine: 31.1 mg/24 hr — ABNORMAL HIGH (ref 0.0–14.9)

## 2016-10-05 ENCOUNTER — Ambulatory Visit (INDEPENDENT_AMBULATORY_CARE_PROVIDER_SITE_OTHER): Payer: Commercial Managed Care - PPO | Admitting: Internal Medicine

## 2016-10-05 VITALS — BP 118/72 | HR 86 | Temp 97.9°F | Wt 164.4 lb

## 2016-10-05 DIAGNOSIS — C3431 Malignant neoplasm of lower lobe, right bronchus or lung: Secondary | ICD-10-CM

## 2016-10-05 DIAGNOSIS — R180 Malignant ascites: Secondary | ICD-10-CM

## 2016-10-05 DIAGNOSIS — R21 Rash and other nonspecific skin eruption: Secondary | ICD-10-CM

## 2016-10-05 DIAGNOSIS — R062 Wheezing: Secondary | ICD-10-CM

## 2016-10-05 MED ORDER — UMECLIDINIUM-VILANTEROL 62.5-25 MCG/INH IN AEPB: 1.0000 | INHALATION_SPRAY | Freq: Every day | RESPIRATORY_TRACT | 0 refills | Status: AC

## 2016-10-05 MED ORDER — IPRATROPIUM-ALBUTEROL 0.5-2.5 (3) MG/3ML IN SOLN: 3.0000 mL | Freq: Four times a day (QID) | RESPIRATORY_TRACT | Status: DC

## 2016-10-05 MED ORDER — FUROSEMIDE 40 MG PO TABS: 40.0000 mg | ORAL_TABLET | Freq: Every day | ORAL | 11 refills | Status: DC

## 2016-10-05 NOTE — Progress Notes (Signed)
Assessment and Plan:   1. Malignant ascites -suspect that given liver dysfunction and metastatic lesions that this is causing him to develop ascites.  We will try to use lasix and treat conservatively; however, given exam think that there is potential for significant fluid pocket.  Will get abdominal ultrasound to determine extent of the ascites and if signficant will order paracentesis.   -will have Dr. Burr Medico recheck BMET in 2 weeks at 10/16/16 appointment and will also send her a note about abdominal and leg swelling.   - furosemide (LASIX) 40 MG tablet; Take 1 tablet (40 mg total) by mouth daily.  Dispense: 30 tablet; Refill: 11 - US Abdomen Complete; Future  2.  Wheezing -duoneb given here in office with relief of wheeze -possible this is COPD vs. Restrictive lung disease component with abdominal swelling -sent home with 3 weeks of anoro  3.  Diffuse Skin rash.  -will send referral to [name] to see if we can get this set up.     HPI 61 y.o.male presents for complaints of abdominal distension and upper leg swelling.  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  He reports that he feels like his legs are swelling in the thigh region and he is also feeling like his liver is distension.  He feels like at times it is really tight.  He is not having significant pain, but he is uncomfortable.  He reports that his stomach distension is improved first thing in the morning.  He reports that in the afternoon and the evening.  He reports that he is having less diarrhea with the xermelo, but he is still getting a lot of gas and gas pains.  He is not viberzi anymore.  He is currently getting injections every week.  He has taken HCTZ with minimal relief.  He reports that he is wondering whether there is anything that can help with the swelling in his legs.  He reports that he feels like he could be more active if he could get rid of the  swelling.  He has a history of ascites and has had a paracentesis before.  His last paracentesis was on 06/08/16.  He has been wheezing lately.  He feels like his breathing is no worse than usual.  He does not use inhalers.  No cough, SOB or CP.  He does want to see if he can get a referral to dermatology.  He has a skin rash that is related to his cancer.  Dr. Burr Medico has already placed a referral but he has not heard anything yet.    Past Medical History:  Diagnosis Date  . History of kidney stones   . Hyperlipidemia   . Hypertension   . Hypogonadism male   . Lung cancer ([place])   . Pre-diabetes   . Vitamin D deficiency      Allergies  Allergen Reactions  . Bee Venom Anaphylaxis  . Ppd [Tuberculin Purified Protein Derivative] Rash  . Voltaren [Diclofenac Sodium] Rash      Current Outpatient Prescriptions on File Prior to Visit  Medication Sig Dispense Refill  . aspirin 325 MG tablet Take 325 mg by mouth daily.    . Aspirin-Acetaminophen-Caffeine (GOODY HEADACHE PO) Take 1 packet by mouth daily as needed (headaches).    . B-D 3CC LUER-LOK SYR 21GX1" 21G X 1" 3 ML MISC USE AS DIRECTED FOR TESTOSTERONE 10 each 0  . Cholecalciferol (VITAMIN D-3) 5000 UNITS  TABS Take 5,000 Units by mouth daily.     . Eluxadoline (VIBERZI) 100 MG TABS Take 1 tablet by mouth 2 (two) times daily. (Patient taking differently: Take 1 tablet by mouth daily. ) 180 tablet 0  . fluticasone (FLONASE) 50 MCG/ACT nasal spray INSTILL 2 SPRAYS INTO THE NOSTRILS AT NIGHT AS NEEDED FOR CONGESTION  5  . Multiple Vitamin (MULTIVITAMIN WITH MINERALS) TABS tablet Take 1 tablet by mouth daily.    Marland Kitchen PREVIDENT 5000 BOOSTER PLUS 1.1 % PSTE Take 1 application by mouth daily.   3  . Telotristat Etiprate (XERMELO) 250 MG TABS Take 250 mg by mouth 3 (three) times daily with meals. 90 tablet 2  . testosterone cypionate (DEPOTESTOSTERONE CYPIONATE) 200 MG/ML injection INJECT 2 ML INTO THE MUSCLE EVERY 2 WEEKS 10 mL 5  .  Triamcinolone Acetonide (TRIAMCINOLONE 0.1 % CREAM : EUCERIN) CREA Apply 1 application topically 2 (two) times daily. 1 each 11  . valACYclovir (VALTREX) 500 MG tablet TAKE 1 TABLET BY MOUTH TWICE DAILY AS NEEDED FOR FEVER BLISTER (Patient not taking: Reported on 08/21/2016) 90 tablet 1   No current facility-administered medications on file prior to visit.     Review of Systems  Constitutional: Negative for chills, fever and malaise/fatigue.  Respiratory: Positive for wheezing. Negative for cough, hemoptysis, sputum production and shortness of breath.   Cardiovascular: Positive for leg swelling. Negative for chest pain, palpitations, orthopnea, claudication and PND.  Gastrointestinal: Positive for abdominal pain and diarrhea. Negative for blood in stool, constipation, melena, nausea and vomiting.  Genitourinary: Negative for dysuria, flank pain, frequency, hematuria and urgency.  Neurological: Negative for dizziness.     Physical Exam: Filed Weights   10/05/16 1144  Weight: 164 lb 6.4 oz (74.6 kg)   BP 118/72   Pulse 86   Temp 97.9 F (36.6 C)   Wt 164 lb 6.4 oz (74.6 kg)   SpO2 99%   BMI 25.75 kg/m  General Appearance: Ruddy faced and chronically ill appearing, Well developed well nourished, non-toxic appearing in no apparent distress. Eyes: PERRLA, EOMs, conjunctiva w/ no swelling or erythema or discharge Sinuses: No Frontal/maxillary tenderness ENT/Mouth: Ear canals clear without swelling or erythema.  TM's normal bilaterally with no retractions, bulging, or loss of landmarks.   Neck: Supple, thyroid normal, no notable JVD  Respiratory: Respiratory effort mildly increased, expiratory wheeze in all lung fields without rales. No retractions or accessory muscle usage. Cardio: RRR with no 2/6 murmur, RGs.   Abdomen: Abdomen distended and tight,  + BS.  Non tender, no guarding, rebound, hernias, masses.  Musculoskeletal: Full ROM, 5/5 strength, normal gait.  Skin: Warm, dry without  rashes  Neuro: Awake and oriented X 3, Cranial nerves intact. Normal muscle tone, no cerebellar symptoms. Sensation intact.  Psych: normal affect, Insight and Judgment appropriate.     Starlyn Skeans, PA-C 12:46 PM [facility]

## 2016-10-05 NOTE — Patient Instructions (Signed)
Please start taking lasix once daily.  This will make you urinate frequently.  Please be close to a bathroom.  If you start developing dizziness or lightheadedness lay down and put your feet up.  Ruben Mccormick is going to call you about you getting an ultrasound to see how much fluid is on your abdomen.  Please ask Dr. Burr Medico to recheck your potassium and kidney functions in a week.     Ascites Ascites is a collection of excess fluid in the abdomen. Ascites can range from mild to severe. It can get worse without treatment. What are the causes? Possible causes include:  Cirrhosis. This is the most common cause of ascites.  Infection or inflammation in the abdomen.  Cancer in the abdomen.  Heart failure.  Kidney disease.  Inflammation of the pancreas.  Clots in the veins of the liver. What are the signs or symptoms? Signs and symptoms may include:  A feeling of fullness in your abdomen. This is common.  An increase in the size of your abdomen or your waist.  Swelling in your legs.  Swelling of the scrotum in men.  Difficulty breathing.  Abdominal pain.  Sudden weight gain. If the condition is mild, you may not have symptoms. How is this diagnosed? To make a diagnosis, your health care provider will:  Ask about your medical history.  Perform a physical exam.  Order imaging tests, such as an ultrasound or CT scan of your abdomen. How is this treated? Treatment depends on the cause of the ascites. It may include:  Taking a pill to make you urinate. This is called a water pill (diuretic pill).  Strictly reducing your salt (sodium) intake. Salt can cause extra fluid to be kept in the body, and this makes ascites worse.  Having a procedure to remove fluid from your abdomen (paracentesis).  Having a procedure to transfer fluid from your abdomen into a vein.  Having a procedure that connects two of the major veins within your liver and relieves pressure on your liver (TIPS  procedure). Ascites may go away or improve with treatment of the condition that caused it. Follow these instructions at home:  Keep track of your weight. To do this, weigh yourself at the same time every day and record your weight.  Keep track of how much you drink and any changes in the amount you urinate.  Follow any instructions that your health care provider gives you about how much to drink.  Try not to eat salty (high-sodium) foods.  Take medicines only as directed by your health care provider.  Keep all follow-up visits as directed by your health care provider. This is important.  Report any changes in your health to your health care provider, especially if you develop new symptoms or your symptoms get worse. Contact a health care provider if:  Your gain more than 3 pounds in 3 days.  Your abdominal size or your waist size increases.  You have new swelling in your legs.  The swelling in your legs gets worse. Get help right away if:  You develop a fever.  You develop confusion.  You develop new or worsening difficulty breathing.  You develop new or worsening abdominal pain.  You develop new or worsening swelling in the scrotum (in men). This information is not intended to replace advice given to you by your health care provider. Make sure you discuss any questions you have with your health care provider. Document Released: 07/27/2005 Document Revised: 12/04/2015 Document  Reviewed: 02/23/2014 Elsevier Interactive Patient Education  2017 [employer].

## 2016-10-09 ENCOUNTER — Telehealth: Payer: Self-pay | Admitting: Hematology

## 2016-10-09 NOTE — Telephone Encounter (Signed)
Called and confirmed appointment changes on 3/9

## 2016-10-15 NOTE — Progress Notes (Signed)
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 No evidence of mediastinal node metastasis or distant metastasis. Groundglass nodule in the right upper lobe is indeterminate 8. No metabolic activity associated with the enhancing hepatic lesion in left and right hepatic lobes, fever up benign etiology or hemangiomas, but malignancy is not ruled out.      05/24/2016 Imaging    Abdomen MRI with and without contrast showed multiple enhancing liver lesions, the largest in the inferior right liver shows central necrosis, suspicious for malignancy.      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       06/11/2016 Procedure    Patient underwent EGD and colonoscopy by Dr. Ardis Hughs, no significant tumor, to the ileum and distal stomach biopsy showed inflammatory change, no evidence of carcinoid. One polyp in ascending colon was removed, showed tubular adenoma.      06/22/2016 -  Chemotherapy    Sandostatin injections every 4 weeks        Adenocarcinoma of right lower lobe of lung ([place])   05/15/2016 Imaging    PET scan showed: 1. Intense metabolic  activity associated with the RIGHT lower lobe pulmonary nodule is concerning for bronchogenic carcinoma. No evidence of mediastinal nodal metastasis or distant metastasis (potential stage IA). 2. Ground-glass nodule RIGHT upper lobe is indeterminate. This lesion will require follow-uphowed       06/19/2016 Procedure    Bronchoscopy biopsy of the right lung mass was negative for malignancy      07/10/2016 Initial Diagnosis    Adenocarcinoma of right lung ([place])      07/10/2016 Procedure    CT-guided right lung mass biopsy showed non-small cell carcinoma, immunostaining was strongly positive for TTF-1, negative for CK 5/6, consistent with primary lung adenocarcinoma      07/31/2016 - 08/06/2016 Radiation Therapy    SBRT 54 Gy in 3 fractions of 18 Gy, by Dr. Tammi Klippel        HISTORY OF PRESENTING ILLNESS (05/15/2016):     Ruben Mccormick 61 y.o. male with past medical history of hepatitis B, hypertension, heavy smoking, is here because of his recent abnormal CT scan, which showed multiple liver lesions concerning for malignancy. He was referred by his primary care physician Dr. Melford Aase.    He has had diarrhea for 1.5 years, it has been getting worse lately, up 20 times a day. He was seen by Dr. Melford Aase and started taking Viberzi in 02/2016 which helps a lot. He underwent abdominal ultrasound, which showed a large lesion in the right lobe of the liver. He subsequently underwent abdominal and pelvis CT with contrast, which showed a dominant  7.6 cm necrotic mass in the inferior right lobe, and multiple other small lesions in the liver.   His last colonoscopy was 5 years ago which was normal per patient. He denies significant abdominal pain or discomfort, no melano or hemachizia, but he has mild heart burn and gassy feeling from [employer]. No other symptoms. He has chronic mild dyspnia from smoking. He lost about 25 lbs in the past 4-5 months. 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   He lives with his wife, has two daughters and one son. His previous hepatitis B test showed positive S antigen and E antibody, hepatitis C negative. Patient is on not aware of his prior hepatitis B infection. He did use cocaine, but no IV drugs. No history of blood transfusion.  CURRENT THERAPY:  somatostatin injection every 4 weeks, started on 06/22/2016  INTERIM HISTORY  Mr. Deskins returns for follow-up and Sandostatin injection. Patient complains of bloating of the belly and swelling of the legs.He is also sore around belly area and has shooting pain mainly in belly, but also in feet. He admits to SOB, and he is more comfortable laying down because his legs swell up when sitting. His energy level has decreased, may not be able to work. His appetite is on/off. Payton Doughty is taken 2x daily for diarrhea. He usually has 2-3 BM a day, which has improved. He is still experiencing skin rashes.   MEDICAL HISTORY:  Past Medical History:  Diagnosis Date  . History of kidney stones   . Hyperlipidemia   . Hypertension   . Hypogonadism male   . Lung cancer ([place])   . Pre-diabetes   . Vitamin D deficiency     SURGICAL HISTORY: Past Surgical History:  Procedure Laterality Date  . APPENDECTOMY    . COLONOSCOPY WITH PROPOFOL N/A 06/11/2016   Procedure: COLONOSCOPY WITH PROPOFOL;  Surgeon: Milus Banister, MD;  Location: [facility];  Service: Endoscopy;  Laterality: N/A;  . ESOPHAGOGASTRODUODENOSCOPY (EGD) WITH PROPOFOL N/A 06/11/2016   Procedure: ESOPHAGOGASTRODUODENOSCOPY (EGD) WITH PROPOFOL;  Surgeon: Milus Banister, MD;  Location: [facility];  Service: Endoscopy;  Laterality: N/A;  . VIDEO BRONCHOSCOPY WITH ENDOBRONCHIAL NAVIGATION Right 06/19/2016   Procedure: VIDEO BRONCHOSCOPY WITH ENDOBRONCHIAL NAVIGATION with RLL brushings, washings and bx;  Surgeon: [name], MD;  Location: [place];  Service: Thoracic;  Laterality: Right;    SOCIAL  HISTORY: Social History   Social History  . Marital status: Married    Spouse name: N/A  . Number of children: N/A  . Years of education: N/A   Occupational History  . Not on file.   Social History Main Topics  . Smoking status: Former Smoker    Packs/day: 1.00    Years: 40.00    Types: Cigarettes    Quit date: 09/11/2015  . Smokeless tobacco: Never Used  . Alcohol use 16.8 oz/week    14 Shots of liquor, 14 Standard drinks or equivalent per week     Comment: 2 drinks daily since young   . Drug use: No     Comment: cocaine in the past, no iv drugs   . Sexual activity: Not on file   Other Topics Concern  . Not on file   Social History Narrative  . No narrative on file    FAMILY HISTORY: Family History  Problem Relation Age of Onset  . Diabetes Father   .  Cancer Father     mesothelioma   . Alzheimer's disease Mother   . Parkinson's disease Mother   . Cancer Paternal Grandfather     colon    ALLERGIES:  is allergic to bee venom; ppd [tuberculin purified protein derivative]; and voltaren [diclofenac sodium].  MEDICATIONS:  Current Outpatient Prescriptions  Medication Sig Dispense Refill  . aspirin 325 MG tablet Take 325 mg by mouth daily.    . Aspirin-Acetaminophen-Caffeine (GOODY HEADACHE PO) Take 1 packet by mouth daily as needed (headaches).    . B-D 3CC LUER-LOK SYR 21GX1" 21G X 1" 3 ML MISC USE AS DIRECTED FOR TESTOSTERONE 10 each 0  . Cholecalciferol (VITAMIN D-3) 5000 UNITS TABS Take 5,000 Units by mouth daily.     . Eluxadoline (VIBERZI) 100 MG TABS Take 1 tablet by mouth 2 (two) times daily. (Patient taking differently: Take 1 tablet by mouth daily. ) 180 tablet 0  . fluticasone (FLONASE) 50 MCG/ACT nasal spray INSTILL 2 SPRAYS INTO THE NOSTRILS AT NIGHT AS NEEDED FOR CONGESTION  5  . furosemide (LASIX) 40 MG tablet Take 1 tablet (40 mg total) by mouth daily. 30 tablet 11  . Multiple Vitamin (MULTIVITAMIN WITH MINERALS) TABS tablet Take 1 tablet by mouth  daily.    Marland Kitchen PREVIDENT 5000 BOOSTER PLUS 1.1 % PSTE Take 1 application by mouth daily.   3  . Telotristat Etiprate (XERMELO) 250 MG TABS Take 250 mg by mouth 3 (three) times daily with meals. 90 tablet 2  . testosterone cypionate (DEPOTESTOSTERONE CYPIONATE) 200 MG/ML injection INJECT 2 ML INTO THE MUSCLE EVERY 2 WEEKS 10 mL 5  . Triamcinolone Acetonide (TRIAMCINOLONE 0.1 % CREAM : EUCERIN) CREA Apply 1 application topically 2 (two) times daily. 1 each 11  . umeclidinium-vilanterol (ANORO ELLIPTA) 62.5-25 MCG/INH AEPB Inhale 1 puff into the lungs daily. 21 each 0  . valACYclovir (VALTREX) 500 MG tablet TAKE 1 TABLET BY MOUTH TWICE DAILY AS NEEDED FOR FEVER BLISTER (Patient not taking: Reported on 08/21/2016) 90 tablet 1   Current Facility-Administered Medications  Medication Dose Route Frequency Provider Last Rate Last Dose  . ipratropium-albuterol (DUONEB) 0.5-2.5 (3) MG/3ML nebulizer solution 3 mL  3 mL Nebulization Q6H Courtney Forcucci, PA-C        REVIEW OF SYSTEMS:  Constitutional: Denies fevers, chills or abnormal night sweats (+) fatigue (+) decrease appetite  Eyes: Denies blurriness of vision, double vision or watery eyes Ears, nose, mouth, throat, and face: Denies mucositis or sore throat Respiratory: Denies cough, dyspnea  (+) SOB, wheezing Cardiovascular: Denies palpitation, chest discomfort or lower extremity swelling Gastrointestinal:  Denies nausea, heartburn Skin:  (+) dry, itchy bumps on skin over abdomen and back Lymphatics: Denies new lymphadenopathy or easy bruising Neurological:Denies numbness, tingling or new weaknesses Behavioral/Psych: Mood is stable, no new changes  All other systems were reviewed with the patient and are negative.  PHYSICAL EXAMINATION:  ECOG PERFORMANCE STATUS: 1 - Symptomatic but completely ambulatory  Vitals:   10/16/16 1300  BP: 128/82  Pulse: 81  Resp: 18  Temp: 97.8 F (36.6 C)   Filed Weights   10/16/16 1300  Weight: 161 lb 9.6 oz  (73.3 kg)    GENERAL:alert, no distress and comfortable SKIN: skin color, texture, turgor are normal, no rashes or significant lesions (+) very dry skin and sporadic scaly rash with scratch markers over abdomen and back (+)flushing EYES: normal, conjunctiva are pink and non-injected, sclera clear OROPHARYNX:no exudate, no erythema and lips, buccal mucosa, and tongue normal  NECK: supple, thyroid normal size, non-tender, without nodularity LYMPH:  no palpable lymphadenopathy in the cervical, axillary or inguinal LUNGS: clear to auscultation and percussion with normal breathing effort (+) wheezing  HEART: regular rate & rhythm and no murmurs and no lower extremity edema ABDOMEN:abdomen soft and normal bowel sounds, no organomegaly. (+)pain in middle abdomen  Musculoskeletal:no cyanosis of digits and no clubbing (+)swelling of lower leg PSYCH: alert & oriented x 3 with fluent speech NEURO: no focal motor/sensory deficits  LABORATORY DATA:  I have reviewed the data as listed CBC Latest Ref Rng & Units 10/16/2016 09/18/2016 08/21/2016  WBC 4.0 - 10.3 10e3/uL 4.5 3.8(L) 6.2  Hemoglobin 13.0 - 17.1 g/dL 14.4 14.0 15.2  Hematocrit 38.4 - 49.9 % 45.3 43.5 47.9  Platelets 140 - 400 10e3/uL 208 180 167   CMP Latest Ref Rng & Units 10/16/2016 09/18/2016 08/21/2016  Glucose 70 - 140 mg/dl 142(H) 94 131  BUN 7.0 - 26.0 mg/dL 13.7 12.9 13.6  Creatinine 0.7 - 1.3 mg/dL 1.0 0.8 0.9  Sodium 136 - 145 mEq/L 142 141 142  Potassium 3.5 - 5.1 mEq/L 4.3 4.5 4.5  Chloride 101 - 111 mmol/L - - -  CO2 22 - 29 mEq/L 34(H) 34(H) 32(H)  Calcium 8.4 - 10.4 mg/dL 9.2 8.7 9.2  Total Protein 6.4 - 8.3 g/dL 6.4 6.2(L) 6.2(L)  Total Bilirubin 0.20 - 1.20 mg/dL 0.70 0.49 0.86  Alkaline Phos 40 - 150 U/L 315(H) 361(H) 259(H)  AST 5 - 34 U/L 32 47(H) 30  ALT 0 - 55 U/L '20 26 19   '$ PATHOLOGY REPORT  Diagnosis 06/08/2016 Liver, biopsy, Right hepatic lobe - METASTATIC LOW GRADE NEUROENDOCRINE TUMOR (CARCINOID TUMOR). - SEE  COMMENT.  Diagnosis 06/11/2016 1. Ileum, biopsy, terminal - INFLAMMATORY PROLAPSE TYPE POLYP. - NO DYSPLASIA OR MALIGNANCY. 2. Colon, polyp(s), ascending - TUBULAR ADENOMA (X3 FRAGMENTS). - NO HIGH GRADE DYSPLASIA OR MALIGNANCY. 3. Stomach, biopsy, distal - CHRONIC ACTIVE GASTRITIS. - NEGATIVE FOR HELICOBACTER PYLORI. - NO INTESTINAL METAPLASIA, DYSPLASIA, OR MALIGNANCY. - NO EVIDENCE OF CARCINOID.  Diagnosis 07/10/2016 Lung, needle/core biopsy(ies), Right Lower Lobe - NON SMALL CELL CARCINOMA.  RADIOGRAPHIC STUDIES: I have personally reviewed the radiological images as listed and agreed with the findings in the report. No results found. EGD 06/11/2016 - Normal esophagus. - Normal examined duodenum. - Abnromal distal stomach, this was somewhat neoplastic appearing, biospied extensively. - The examination was otherwise normal.  Colonoscopy 06/11/2016: One abnormal appearing ileal polypoid leion in the terminal ileum. Biopsied. - Two 8 to 11 mm polyps in the transverse colon and in the ascending colon, removed with a hot snare. Resected and retrieved. - One 5 mm polyp in the transverse colon, removed with a cold snare. Resected and retrieved. - Diverticulosis in the left colon.  ECHO 06/05/2016 - Left ventricle: The cavity size was normal. Left ventricular   diastolic function parameters were normal. - Ventricular septum: The contour showed diastolic flattening.   These changes are consistent with RV volume overload. - Right ventricle: The cavity size was severely dilated. Systolic   function was mildly reduced. - Right atrium: The atrium was severely dilated. - Tricuspid valve: Mobility was restricted. There was malcoaptation   of the valve leaflets. There was wide-open regurgitation. 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  ASSESSMENT & PLAN:  61 y.o. male with past medical history of hypertension, hepatitis B infection,  presented with worsening diarrhea for one and half years, fatigue, anorexia and weight  loss.  1. 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 I spoke with radiologist Dr. Leonia Reeves and we think his primary carcinoid tumor is probably in the terminal ileum. The PET scan also reviewed multiple bone metastasis, he is not symptomatic from the bone mets.  -I previously discussed his liver biopsy results, which showed metastatic carcinoid tumor, low-grade -His EGD showed abnormal appearing of the distal stomach, however the biopsy only showed inflammatory change. Colonoscopy was also an negative except polyps, and biopsy was negative for carcinoid tumor. --We previously discussed the nature history of metastatic carcinoid tumor. Due to his diffuse liver and bone metastasis, I do not think he is a candidate for upfront metastasectomy, and his disease is likely incurable. We discussed this is a indolent disease, and most people do well with treatment -the goal of therapy is palliative, to prolong his life and improve his quality of life  -He has previously started Sandostatin injection, tolerated well, but he is more symptomatic lately, will probably new ascites and fatigue  -his diarrhea has improved with Xermelo '250mg'$  2-3 times daily. I encourage him to take 3 times a day -due to his diffuse bone metastasis, I recommend him to start Xgeva injection today. Potential side effects, especially jaw necrosis, were discussed with patient. -I encouraged him to take calcium and vitamin D. -I'll arrange ultrasound of the abdomen and a paracentesis to be done in the next week due to symptomatic ascites I'll repeat his CT abdomen and pelvis in the  next few weeks to evaluate his disease status.  -If his ascites is malignant, or he has disease progression on his CT scan, we'll conside switching his therapy, especially [id number] if it's available.    3. 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 Skin rashes -he previously had diffuse skin rashes on his trunk, not sure if it's related to his carcinoid syndrome. It's very itchy, and the bothersome. He has tried over-the-counter and prescription topical steroids, which does not help. - I previously referred him to dermatology, he has not had appointment yet   5. Adenocarcinoma of right lung, cT1N0M0, stage I  -His right lower lobe nodule is intensely hypermetabolic on PET scan, no significant adenopathy or other hypermetabolic lesion suggesting metastasis. Biopsy confirmed non-small cell lung cancer, consistent with adenocarcinoma. -He is s/p SBRT to his lung cancer, will follow up with Dr. Tammi Klippel  6. Goal of care discussion  -We previously discussed the incurable nature of his metastatic carcinoid tumor, it's very treatable and probably slow growing, but we are not able to cure it. Most pt can liver for several years.  -The patient understands the goal of care is palliative.  Plan - Sandostatin and Xgeva Injections today and continue every 4 weeks  - f/u in 2-3 weeks with CT abd/pel w contrast before  - [place] next week for paracentesis   All questions were answered. The  patient knows to call the clinic with any problems, questions or concerns.  I spent 25 minutes counseling the patient face to  face. The total time spent in the appointment was 30 minutes and more than 50% was on counseling.  This document serves as a record of services personally performed by Truitt Merle, MD. It was created on her behalf by Brandt Loosen, a trained medical scribe. The creation of this record is based on the scribe's personal observations and the provider's statements to them. This document has been checked and approved by the attending provider.    Truitt Merle, MD 10/16/2016

## 2016-10-16 ENCOUNTER — Ambulatory Visit (HOSPITAL_BASED_OUTPATIENT_CLINIC_OR_DEPARTMENT_OTHER): Payer: Commercial Managed Care - PPO

## 2016-10-16 ENCOUNTER — Other Ambulatory Visit (HOSPITAL_BASED_OUTPATIENT_CLINIC_OR_DEPARTMENT_OTHER): Payer: Commercial Managed Care - PPO

## 2016-10-16 ENCOUNTER — Ambulatory Visit (HOSPITAL_BASED_OUTPATIENT_CLINIC_OR_DEPARTMENT_OTHER): Payer: Commercial Managed Care - PPO | Admitting: Hematology

## 2016-10-16 ENCOUNTER — Telehealth: Payer: Self-pay | Admitting: Hematology

## 2016-10-16 ENCOUNTER — Encounter: Payer: Self-pay | Admitting: Hematology

## 2016-10-16 VITALS — BP 128/82 | HR 81 | Temp 97.8°F | Resp 18 | Ht 67.0 in | Wt 161.6 lb

## 2016-10-16 DIAGNOSIS — C7B02 Secondary carcinoid tumors of liver: Secondary | ICD-10-CM | POA: Diagnosis not present

## 2016-10-16 DIAGNOSIS — E340 Carcinoid syndrome: Secondary | ICD-10-CM | POA: Diagnosis not present

## 2016-10-16 DIAGNOSIS — C7B03 Secondary carcinoid tumors of bone: Secondary | ICD-10-CM

## 2016-10-16 DIAGNOSIS — R21 Rash and other nonspecific skin eruption: Secondary | ICD-10-CM | POA: Diagnosis not present

## 2016-10-16 DIAGNOSIS — C7A00 Malignant carcinoid tumor of unspecified site: Secondary | ICD-10-CM

## 2016-10-16 DIAGNOSIS — R109 Unspecified abdominal pain: Secondary | ICD-10-CM | POA: Diagnosis not present

## 2016-10-16 DIAGNOSIS — C3491 Malignant neoplasm of unspecified part of right bronchus or lung: Secondary | ICD-10-CM | POA: Diagnosis not present

## 2016-10-16 DIAGNOSIS — C3431 Malignant neoplasm of lower lobe, right bronchus or lung: Secondary | ICD-10-CM

## 2016-10-16 LAB — COMPREHENSIVE METABOLIC PANEL
ALT: 20 U/L (ref 0–55)
AST: 32 U/L (ref 5–34)
Albumin: 3.1 g/dL — ABNORMAL LOW (ref 3.5–5.0)
Alkaline Phosphatase: 315 U/L — ABNORMAL HIGH (ref 40–150)
Anion Gap: 8 mEq/L (ref 3–11)
BUN: 13.7 mg/dL (ref 7.0–26.0)
CALCIUM: 9.2 mg/dL (ref 8.4–10.4)
CHLORIDE: 100 meq/L (ref 98–109)
CO2: 34 mEq/L — ABNORMAL HIGH (ref 22–29)
Creatinine: 1 mg/dL (ref 0.7–1.3)
EGFR: 82 mL/min/{1.73_m2} — ABNORMAL LOW (ref >90)
GLUCOSE: 142 mg/dL — AB (ref 70–140)
POTASSIUM: 4.3 meq/L (ref 3.5–5.1)
SODIUM: 142 meq/L (ref 136–145)
Total Bilirubin: 0.7 mg/dL (ref 0.20–1.20)
Total Protein: 6.4 g/dL (ref 6.4–8.3)

## 2016-10-16 LAB — CBC WITH DIFFERENTIAL/PLATELET
BASO%: 1.1 % (ref 0.0–2.0)
BASOS ABS: 0 10*3/uL (ref 0.0–0.1)
EOS%: 2.1 % (ref 0.0–7.0)
Eosinophils Absolute: 0.1 10*3/uL (ref 0.0–0.5)
HCT: 45.3 % (ref 38.4–49.9)
HGB: 14.4 g/dL (ref 13.0–17.1)
LYMPH%: 18.6 % (ref 14.0–49.0)
MCH: 31.4 pg (ref 27.2–33.4)
MCHC: 31.8 g/dL — AB (ref 32.0–36.0)
MCV: 98.7 fL — ABNORMAL HIGH (ref 79.3–98.0)
MONO#: 0.4 10*3/uL (ref 0.1–0.9)
MONO%: 8.8 % (ref 0.0–14.0)
NEUT#: 3.1 10*3/uL (ref 1.5–6.5)
NEUT%: 69.4 % (ref 39.0–75.0)
Platelets: 208 10*3/uL (ref 140–400)
RBC: 4.59 10*6/uL (ref 4.20–5.82)
RDW: 17.3 % — ABNORMAL HIGH (ref 11.0–14.6)
WBC: 4.5 10*3/uL (ref 4.0–10.3)
lymph#: 0.8 10*3/uL — ABNORMAL LOW (ref 0.9–3.3)

## 2016-10-16 MED ORDER — OCTREOTIDE ACETATE 30 MG IM KIT
30.0000 mg | PACK | Freq: Once | INTRAMUSCULAR | Status: AC
  Administered 2016-10-16: 30 mg via INTRAMUSCULAR
  Filled 2016-10-16: qty 1

## 2016-10-16 MED ORDER — DENOSUMAB 120 MG/1.7ML ~~LOC~~ SOLN
120.0000 mg | Freq: Once | SUBCUTANEOUS | Status: AC
  Administered 2016-10-16: 120 mg via SUBCUTANEOUS
  Filled 2016-10-16: qty 1.7

## 2016-10-16 MED ORDER — OCTREOTIDE ACETATE 30 MG IM KIT
30.0000 mg | PACK | Freq: Once | INTRAMUSCULAR | Status: AC
  Filled 2016-10-16: qty 1

## 2016-10-16 NOTE — Patient Instructions (Signed)
Denosumab injection What is this medicine? DENOSUMAB (den oh sue mab) slows bone breakdown. Prolia is used to treat osteoporosis in women after menopause and in men. Delton See is used to treat a high calcium level due to cancer and to prevent bone fractures and other bone problems caused by multiple myeloma or cancer bone metastases. Delton See is also used to treat giant cell tumor of the bone. This medicine may be used for other purposes; ask your health care provider or pharmacist if you have questions. COMMON BRAND NAME(S): Prolia, XGEVA What should I tell my health care provider before I take this medicine? They need to know if you have any of these conditions: -dental disease -having surgery or tooth extraction -infection -kidney disease -low levels of calcium or Vitamin D in the blood -malnutrition -on hemodialysis -skin conditions or sensitivity -thyroid or parathyroid disease -an unusual reaction to denosumab, other medicines, foods, dyes, or preservatives -pregnant or trying to get pregnant -breast-feeding How should I use this medicine? This medicine is for injection under the skin. It is given by a health care professional in a hospital or clinic setting. If you are getting Prolia, a special MedGuide will be given to you by the pharmacist with each prescription and refill. Be sure to read this information carefully each time. For Prolia, talk to your pediatrician regarding the use of this medicine in children. Special care may be needed. For Delton See, talk to your pediatrician regarding the use of this medicine in children. While this drug may be prescribed for children as young as 13 years for selected conditions, precautions do apply. Overdosage: If you think you have taken too much of this medicine contact a poison control center or emergency room at once. NOTE: This medicine is only for you. Do not share this medicine with others. What if I miss a dose? It is important not to miss your  dose. Call your doctor or health care professional if you are unable to keep an appointment. What may interact with this medicine? Do not take this medicine with any of the following medications: -other medicines containing denosumab This medicine may also interact with the following medications: -medicines that lower your chance of fighting infection -steroid medicines like prednisone or cortisone This list may not describe all possible interactions. Give your health care provider a list of all the medicines, herbs, non-prescription drugs, or dietary supplements you use. Also tell them if you smoke, drink alcohol, or use illegal drugs. Some items may interact with your medicine. What should I watch for while using this medicine? Visit your doctor or health care professional for regular checks on your progress. Your doctor or health care professional may order blood tests and other tests to see how you are doing. Call your doctor or health care professional for advice if you get a fever, chills or sore throat, or other symptoms of a cold or flu. Do not treat yourself. This drug may decrease your body's ability to fight infection. Try to avoid being around people who are sick. You should make sure you get enough calcium and vitamin D while you are taking this medicine, unless your doctor tells you not to. Discuss the foods you eat and the vitamins you take with your health care professional. See your dentist regularly. Brush and floss your teeth as directed. Before you have any dental work done, tell your dentist you are receiving this medicine. Do not become pregnant while taking this medicine or for 5 months after stopping  it. Talk with your doctor or health care professional about your birth control options while taking this medicine. Women should inform their doctor if they wish to become pregnant or think they might be pregnant. There is a potential for serious side effects to an unborn child. Talk  to your health care professional or pharmacist for more information. What side effects may I notice from receiving this medicine? Side effects that you should report to your doctor or health care professional as soon as possible: -allergic reactions like skin rash, itching or hives, swelling of the face, lips, or tongue -bone pain -breathing problems -dizziness -jaw pain, especially after dental work -redness, blistering, peeling of the skin -signs and symptoms of infection like fever or chills; cough; sore throat; pain or trouble passing urine -signs of low calcium like fast heartbeat, muscle cramps or muscle pain; pain, tingling, numbness in the hands or feet; seizures -unusual bleeding or bruising -unusually weak or tired Side effects that usually do not require medical attention (report to your doctor or health care professional if they continue or are bothersome): -constipation -diarrhea -headache -joint pain -loss of appetite -muscle pain -runny nose -tiredness -upset stomach This list may not describe all possible side effects. Call your doctor for medical advice about side effects. You may report side effects to FDA at 1-800-FDA-1088. Where should I keep my medicine? This medicine is only given in a clinic, doctor's office, or other health care setting and will not be stored at home. NOTE: This sheet is a summary. It may not cover all possible information. If you have questions about this medicine, talk to your doctor, pharmacist, or health care provider.  2018 [employer] Standard (2016-08-18 19:17:21) Octreotide injection solution What is this medicine? OCTREOTIDE (ok TREE oh tide) is used to reduce blood levels of growth hormone in patients with a condition called acromegaly. This medicine also reduces flushing and watery diarrhea caused by certain types of cancer. This medicine may be used for other purposes; ask your health care provider or pharmacist if you have  questions. What should I tell my health care provider before I take this medicine? They need to know if you have any of these conditions: -gallbladder disease -kidney disease -liver disease -an unusual or allergic reaction to octreotide, other medicines, foods, dyes, or preservatives -pregnant or trying to get pregnant -breast-feeding How should I use this medicine? This medicine is for injection under the skin or into a vein (only in emergency situations). It is usually given by a health care professional in a hospital or clinic setting. If you get this medicine at home, you will be taught how to prepare and give this medicine. Allow the injection solution to come to room temperature before use. Do not warm it artificially. Use exactly as directed. Take your medicine at regular intervals. Do not take your medicine more often than directed. It is important that you put your used needles and syringes in a special sharps container. Do not put them in a trash can. If you do not have a sharps container, call your pharmacist or healthcare provider to get one. Talk to your pediatrician regarding the use of this medicine in children. Special care may be needed. Overdosage: If you think you have taken too much of this medicine contact a poison control center or emergency room at once. NOTE: This medicine is only for you. Do not share this medicine with others. What if I miss a dose? If you miss a dose, take  it as soon as you can. If it is almost time for your next dose, take only that dose. Do not take double or extra doses. What may interact with this medicine? Do not take this medicine with any of the following medications: -cisapride -droperidol -general anesthetics -grepafloxacin -perphenazine -thioridazine This medicine may also interact with the following medications: -bromocriptine -cyclosporine -diuretics -medicines for blood pressure, heart disease, irregular heart beat -medicines for  diabetes, including insulin -quinidine This list may not describe all possible interactions. Give your health care provider a list of all the medicines, herbs, non-prescription drugs, or dietary supplements you use. Also tell them if you smoke, drink alcohol, or use illegal drugs. Some items may interact with your medicine. What should I watch for while using this medicine? Visit your doctor or health care professional for regular checks on your progress. To help reduce irritation at the injection site, use a different site for each injection and make sure the solution is at room temperature before use. This medicine may cause increases or decreases in blood sugar. Signs of high blood sugar include frequent urination, unusual thirst, flushed or dry skin, difficulty breathing, drowsiness, stomach ache, nausea, vomiting or dry mouth. Signs of low blood sugar include chills, cool, pale skin or cold sweats, drowsiness, extreme hunger, fast heartbeat, headache, nausea, nervousness or anxiety, shakiness, trembling, unsteadiness, tiredness, or weakness. Contact your doctor or health care professional right away if you experience any of these symptoms. What side effects may I notice from receiving this medicine? Side effects that you should report to your doctor or health care professional as soon as possible: -allergic reactions like skin rash, itching or hives, swelling of the face, lips, or tongue -changes in blood sugar -changes in heart rate -severe stomach pain Side effects that usually do not require medical attention (report to your doctor or health care professional if they continue or are bothersome): -diarrhea or constipation -gas or stomach pain -nausea, vomiting -pain, redness, swelling and irritation at site where injected This list may not describe all possible side effects. Call your doctor for medical advice about side effects. You may report side effects to FDA at 1-800-FDA-1088. Where  should I keep my medicine? Keep out of the reach of children. Store in a refrigerator between 2 and 8 degrees C (36 and 46 degrees F). Protect from light. Allow to come to room temperature naturally. Do not use artificial heat. If protected from light, the injection may be stored at room temperature between 20 and 30 degrees C (70 and 86 degrees F) for 14 days. After the initial use, throw away any unused portion of a multiple dose vial after 14 days. Throw away unused portions of the ampules after use. NOTE: This sheet is a summary. It may not cover all possible information. If you have questions about this medicine, talk to your doctor, pharmacist, or health care provider.    2016, [employer]. (2008-02-21 16:56:04)

## 2016-10-16 NOTE — Telephone Encounter (Signed)
Appointments scheduled per 3/9 LOS. Patient given AVS report and calendars with future scheduled appointments. Patient also given two bottles of contrast and instructions for CT scan appointment.

## 2016-10-19 ENCOUNTER — Telehealth: Payer: Self-pay | Admitting: Hematology

## 2016-10-19 NOTE — Telephone Encounter (Signed)
Patient came to scheduling to check to see why his CT scan and [place] appointments have not been scheduled.

## 2016-10-20 LAB — CHROMOGRANIN A: Chromogranin A: 189 nmol/L — ABNORMAL HIGH (ref 0–5)

## 2016-10-22 ENCOUNTER — Ambulatory Visit (HOSPITAL_COMMUNITY)
Admission: RE | Admit: 2016-10-22 | Discharge: 2016-10-22 | Disposition: A | Discharge status: Home / Self Care | Payer: Commercial Managed Care - PPO | Source: Ambulatory Visit | Attending: Hematology | Admitting: Hematology

## 2016-10-22 ENCOUNTER — Telehealth: Payer: Self-pay | Admitting: *Deleted

## 2016-10-22 ENCOUNTER — Encounter (HOSPITAL_COMMUNITY): Payer: Self-pay

## 2016-10-22 DIAGNOSIS — C787 Secondary malignant neoplasm of liver and intrahepatic bile duct: Secondary | ICD-10-CM | POA: Diagnosis not present

## 2016-10-22 DIAGNOSIS — R188 Other ascites: Secondary | ICD-10-CM | POA: Insufficient documentation

## 2016-10-22 DIAGNOSIS — C7951 Secondary malignant neoplasm of bone: Secondary | ICD-10-CM | POA: Diagnosis not present

## 2016-10-22 DIAGNOSIS — C3431 Malignant neoplasm of lower lobe, right bronchus or lung: Secondary | ICD-10-CM | POA: Insufficient documentation

## 2016-10-22 MED ORDER — IOPAMIDOL (ISOVUE-300) INJECTION 61%
100.0000 mL | Freq: Once | INTRAVENOUS | Status: AC | PRN
Start: 2016-10-22 — End: 2016-10-22
  Administered 2016-10-22: 100 mL via INTRAVENOUS

## 2016-10-22 MED ORDER — IOPAMIDOL (ISOVUE-300) INJECTION 61%
INTRAVENOUS | Status: AC
  Filled 2016-10-22: qty 100

## 2016-10-22 NOTE — Telephone Encounter (Signed)
Pt left FMLA forms at nurses' desk today.  Collaborative nurse left forms in [place] box,  FMLA specialist  for assistance.

## 2016-10-25 LAB — 5 HIAA, QUANTITATIVE, URINE, 24 HOUR
5-HIAA, URINE: 179 mg/L
5-HIAA,Quant.,24 Hr Urine: 134.3 mg/24 hr — ABNORMAL HIGH (ref 0.0–14.9)

## 2016-10-27 ENCOUNTER — Ambulatory Visit (HOSPITAL_COMMUNITY)
Admission: RE | Admit: 2016-10-27 | Discharge: 2016-10-27 | Disposition: A | Discharge status: Home / Self Care | Payer: Commercial Managed Care - PPO | Source: Ambulatory Visit | Attending: Hematology | Admitting: Hematology

## 2016-10-27 DIAGNOSIS — C3431 Malignant neoplasm of lower lobe, right bronchus or lung: Secondary | ICD-10-CM | POA: Insufficient documentation

## 2016-10-27 DIAGNOSIS — R188 Other ascites: Secondary | ICD-10-CM | POA: Diagnosis not present

## 2016-10-27 LAB — ALBUMIN, PLEURAL OR PERITONEAL FLUID: ALBUMIN FL: 1.7 g/dL

## 2016-10-27 LAB — BODY FLUID CELL COUNT WITH DIFFERENTIAL
Lymphs, Fluid: 36 %
MONOCYTE-MACROPHAGE-SEROUS FLUID: 61 % (ref 50–90)
Neutrophil Count, Fluid: 3 % (ref 0–25)
Total Nucleated Cell Count, Fluid: 316 cu mm (ref 0–1000)

## 2016-10-27 LAB — PROTEIN, PLEURAL OR PERITONEAL FLUID: Total protein, fluid: <3 g/dL

## 2016-10-27 NOTE — Procedures (Signed)
Ultrasound-guided diagnostic and therapeutic paracentesis performed yielding 4 liters (maximum ordered) of light yellow fluid. No immediate complications. A portion of the fluid was submitted to the lab for preordered studies.

## 2016-10-29 NOTE — Progress Notes (Signed)
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)   05/15/2016 Imaging    PET scan showed intense metabolic activity associated with the right lower lobe form a nodule, concerning for bronchogenic carcinoma. No evidence of mediastinal node metastasis or distant metastasis. Groundglass nodule in the right upper lobe is indeterminate 8. No metabolic activity associated with the enhancing hepatic lesion in left and right hepatic lobes, fever up benign etiology or hemangiomas, but malignancy is not ruled out.      05/24/2016 Imaging    Abdomen MRI with and without contrast showed multiple enhancing liver lesions, the largest in the inferior right liver shows central necrosis, suspicious for malignancy.      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       06/11/2016 Procedure    Patient underwent EGD and colonoscopy by Dr. Ardis Hughs, no significant tumor, to the ileum and distal stomach biopsy showed inflammatory change, no evidence of carcinoid. One polyp in ascending colon was removed, showed tubular adenoma.      06/22/2016 -  Chemotherapy    Sandostatin injections every 4 weeks       10/23/2016 Imaging    CT ABD/PELVIS w/ Contrast on 10/23/16 IMPRESSION: No significant interval change in diffuse  hypervascular liver metastases. Stable subtle small lytic bone metastases within the pelvis. Increased large amount of ascites and diffuse body wall edema. New left abdominal omental soft tissue thickening, suspicious for peritoneal carcinomatosis.      10/27/2016 Procedure    US Paracentesis 10/27/16 IMPRESSION: Successful ultrasound-guided diagnostic and therapeutic paracentesis yielding 4 liters of peritoneal fluid.      10/27/2016 Pathology Results    Pathology 10/27/16 Diagnosis PERITONEAL/ASCITIC FLUID (SPECIMEN 1 OF 1 COLLECTED 10/27/16): REACTIVE MESOTHELIAL CELLS. NO MALIGNANT CELLS IDENTIFIED.       Adenocarcinoma of right lower lobe of lung ([place])   05/15/2016 Imaging    PET scan showed: 1. Intense metabolic activity associated with the RIGHT lower lobe pulmonary nodule is concerning for bronchogenic carcinoma. No evidence of mediastinal nodal metastasis or distant metastasis (potential stage IA). 2. Ground-glass nodule RIGHT upper lobe is indeterminate. This lesion will require follow-uphowed       06/19/2016 Procedure    Bronchoscopy biopsy of the right lung mass was negative for malignancy      07/10/2016 Initial Diagnosis    Adenocarcinoma of right lung ([place])      07/10/2016 Procedure    CT-guided right lung mass biopsy showed non-small cell carcinoma, immunostaining was strongly positive for TTF-1, negative for CK 5/6, consistent with primary lung adenocarcinoma      07/31/2016 - 08/06/2016 Radiation Therapy    SBRT 54 Gy in 3 fractions of 18 Gy, by Dr. Tammi Klippel        HISTORY OF PRESENTING ILLNESS (05/15/2016):  Ruben Mccormick 61 y.o. male with past medical history of hepatitis B, hypertension, heavy smoking, is here because of his recent abnormal CT scan, which showed multiple liver lesions concerning for malignancy. He was referred by his primary care physician Dr. Melford Aase.    He has had diarrhea for 1.5 years, it has been getting worse lately, up 20 times  a day. He was seen by Dr. Melford Aase and started taking Viberzi in 02/2016 which helps a lot. He underwent abdominal ultrasound, which showed a large lesion in the right lobe of the liver. He subsequently underwent abdominal and pelvis CT with contrast, which showed a dominant 7.6 cm necrotic mass in the inferior right lobe, and multiple other small lesions in the liver.   His last colonoscopy was 5 years ago which was normal per patient. He denies significant abdominal pain or discomfort, no melano or hemachizia, but he has mild heart burn and gassy feeling from [employer]. No other symptoms. He has chronic mild dyspnia from smoking. He lost about 25 lbs in the past 4-5 months. 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   He lives with his wife, has two daughters and one son. His previous hepatitis B test showed positive S antigen and E antibody, hepatitis C negative. Patient is on not aware of his prior hepatitis B infection. He did use cocaine, but no IV drugs. No history of blood transfusion.  CURRENT THERAPY:  somatostatin injection every 4 weeks, started on 06/22/2016. Pending Everolimus   INTERIM HISTORY  [name] returns for follow-up and Sandostatin injection. CT of the abd/pelvis was performed on 10/23/16 showed no significant  The patient had US paracentesis on 10/27/16 of 4L of peritoneal fluid revealing reactive mesothelial cells, no malignant cells identified. He feels much better after his paracentesis. He had chest pain for the past week. Shooting pain in the left chest that lasts a few seconds. He reports it would happen with no warning. He could be sitting and it would happen. He has quit work due to fatigue. He reports less flushing. He reports 3-4 bowel movements a day; diarrhea. He takes Xermelo twice a day. He eats a big meal for breakfast and dinner and snacks throughout the day. He reports feeling better 6 months ago  than he does now.  MEDICAL HISTORY:  Past Medical History:  Diagnosis Date  . History of kidney stones   . Hyperlipidemia   . Hypertension   . Hypogonadism male   . Lung cancer ([place])   . Pre-diabetes   . Vitamin D deficiency     SURGICAL HISTORY: Past Surgical History:  Procedure Laterality Date  . APPENDECTOMY    . COLONOSCOPY WITH PROPOFOL N/A 06/11/2016   Procedure: COLONOSCOPY WITH PROPOFOL;  Surgeon: Milus Banister, MD;  Location: [facility];  Service: Endoscopy;  Laterality: N/A;  . ESOPHAGOGASTRODUODENOSCOPY (EGD) WITH PROPOFOL N/A 06/11/2016   Procedure: ESOPHAGOGASTRODUODENOSCOPY (EGD) WITH PROPOFOL;  Surgeon: Milus Banister, MD;  Location: [facility];  Service: Endoscopy;  Laterality: N/A;  . VIDEO BRONCHOSCOPY WITH ENDOBRONCHIAL NAVIGATION Right 06/19/2016   Procedure: VIDEO BRONCHOSCOPY WITH ENDOBRONCHIAL NAVIGATION with RLL brushings, washings and bx;  Surgeon: [name], MD;  Location: [facility];  Service: Thoracic;  Laterality: Right;    SOCIAL HISTORY: Social History   Social History  . Marital status: Married    Spouse name: N/A  . Number of children: N/A  .  Years of education: N/A   Occupational History  . Not on file.   Social History Main Topics  . Smoking status: Former Smoker    Packs/day: 1.00    Years: 40.00    Types: Cigarettes    Quit date: 09/11/2015  . Smokeless tobacco: Never Used  . Alcohol use 16.8 oz/week    14 Shots of liquor, 14 Standard drinks or equivalent per week     Comment: 2 drinks daily since young   . Drug use: No     Comment: cocaine in the past, no iv drugs   . Sexual activity: Not on file   Other Topics Concern  . Not on file   Social History Narrative  . No narrative on file    FAMILY HISTORY: Family History  Problem Relation Age of Onset  . Diabetes Father   . Cancer Father     mesothelioma   . Alzheimer's disease Mother   . Parkinson's disease Mother   . Cancer Paternal Grandfather     colon     ALLERGIES:  is allergic to bee venom; ppd [tuberculin purified protein derivative]; and voltaren [diclofenac sodium].  MEDICATIONS:  Current Outpatient Prescriptions  Medication Sig Dispense Refill  . aspirin 325 MG tablet Take 325 mg by mouth daily.    . Aspirin-Acetaminophen-Caffeine (GOODY HEADACHE PO) Take 1 packet by mouth daily as needed (headaches).    . B-D 3CC LUER-LOK SYR 21GX1" 21G X 1" 3 ML MISC USE AS DIRECTED FOR TESTOSTERONE 10 each 0  . Cholecalciferol (VITAMIN D-3) 5000 UNITS TABS Take 5,000 Units by mouth daily.     . Eluxadoline (VIBERZI) 100 MG TABS Take 1 tablet by mouth 2 (two) times daily. (Patient taking differently: Take 1 tablet by mouth daily. ) 180 tablet 0  . fluticasone (FLONASE) 50 MCG/ACT nasal spray INSTILL 2 SPRAYS INTO THE NOSTRILS AT NIGHT AS NEEDED FOR CONGESTION  5  . furosemide (LASIX) 40 MG tablet Take 1 tablet (40 mg total) by mouth daily. 30 tablet 11  . Multiple Vitamin (MULTIVITAMIN WITH MINERALS) TABS tablet Take 1 tablet by mouth daily.    Marland Kitchen PREVIDENT 5000 BOOSTER PLUS 1.1 % PSTE Take 1 application by mouth daily.   3  . Telotristat Etiprate (XERMELO) 250 MG TABS Take 250 mg by mouth 3 (three) times daily with meals. 90 tablet 2  . testosterone cypionate (DEPOTESTOSTERONE CYPIONATE) 200 MG/ML injection INJECT 2 ML INTO THE MUSCLE EVERY 2 WEEKS 10 mL 5  . Triamcinolone Acetonide (TRIAMCINOLONE 0.1 % CREAM : EUCERIN) CREA Apply 1 application topically 2 (two) times daily. 1 each 11  . umeclidinium-vilanterol (ANORO ELLIPTA) 62.5-25 MCG/INH AEPB Inhale 1 puff into the lungs daily. 21 each 0  . everolimus (AFINITOR) 10 MG tablet Take 1 tablet (10 mg total) by mouth daily. 30 tablet 2  . valACYclovir (VALTREX) 500 MG tablet TAKE 1 TABLET BY MOUTH TWICE DAILY AS NEEDED FOR FEVER BLISTER (Patient not taking: Reported on 08/21/2016) 90 tablet 1   Current Facility-Administered Medications  Medication Dose Route Frequency Provider Last Rate Last Dose  .  ipratropium-albuterol (DUONEB) 0.5-2.5 (3) MG/3ML nebulizer solution 3 mL  3 mL Nebulization Q6H Courtney Forcucci, PA-C        REVIEW OF SYSTEMS:  Constitutional: Denies fevers, chills or abnormal night sweats (+) fatigue (+) Intermittent chest pain (+) Flushing Eyes: Denies blurriness of vision, double vision or watery eyes Ears, nose, mouth, throat, and face: Denies mucositis or sore throat Respiratory: Denies cough,  dyspnea Cardiovascular: Denies palpitation, chest discomfort or lower extremity swelling Gastrointestinal:  Denies nausea, heartburn (+) Diarrhea Skin:  (+) dry, itchy bumps on skin over abdomen and back Lymphatics: Denies new lymphadenopathy or easy bruising Neurological:Denies numbness, tingling or new weaknesses Behavioral/Psych: Mood is stable, no new changes  All other systems were reviewed with the patient and are negative.  PHYSICAL EXAMINATION:  ECOG PERFORMANCE STATUS: 1 - Symptomatic but completely ambulatory Blood pressure 131/85, temperature 97.7, heart rate 78, respiratory rate 18, pulse ox 100% on room air, weight 155 lbs  GENERAL:alert, no distress and comfortable SKIN: skin color, texture, turgor are normal, no rashes or significant lesions (+) very dry skin and sporadic scaly rash with scratch markers over abdomen and back (+) flushing EYES: normal, conjunctiva are pink and non-injected, sclera clear OROPHARYNX:no exudate, no erythema and lips, buccal mucosa, and tongue normal  NECK: supple, thyroid normal size, non-tender, without nodularity LYMPH:  no palpable lymphadenopathy in the cervical, axillary or inguinal LUNGS: clear to auscultation and percussion with normal breathing effort HEART: regular rate & rhythm and no murmurs and no lower extremity edema ABDOMEN:abdomen soft and normal bowel sounds, no organomegaly. (+)pain in middle abdomen  Musculoskeletal:no cyanosis of digits and no clubbing (+)swelling of lower leg PSYCH: alert & oriented x 3  with fluent speech NEURO: no focal motor/sensory deficits  LABORATORY DATA:  I have reviewed the data as listed CBC Latest Ref Rng & Units 10/16/2016 09/18/2016 08/21/2016  WBC 4.0 - 10.3 10e3/uL 4.5 3.8(L) 6.2  Hemoglobin 13.0 - 17.1 g/dL 14.4 14.0 15.2  Hematocrit 38.4 - 49.9 % 45.3 43.5 47.9  Platelets 140 - 400 10e3/uL 208 180 167   CMP Latest Ref Rng & Units 10/30/2016 10/16/2016 09/18/2016  Glucose 70 - 140 mg/dl 106 142(H) 94  BUN 7.0 - 26.0 mg/dL 10.7 13.7 12.9  Creatinine 0.7 - 1.3 mg/dL 0.8 1.0 0.8  Sodium 136 - 145 mEq/L 141 142 141  Potassium 3.5 - 5.1 mEq/L 4.9 4.3 4.5  Chloride 101 - 111 mmol/L - - -  CO2 22 - 29 mEq/L 31(H) 34(H) 34(H)  Calcium 8.4 - 10.4 mg/dL 8.5 9.2 8.7  Total Protein 6.4 - 8.3 g/dL 5.9(L) 6.4 6.2(L)  Total Bilirubin 0.20 - 1.20 mg/dL 0.67 0.70 0.49  Alkaline Phos 40 - 150 U/L 322(H) 315(H) 361(H)  AST 5 - 34 U/L 31 32 47(H)  ALT 0 - 55 U/L '21 20 26   '$ PATHOLOGY REPORT  Diagnosis 06/08/2016 Liver, biopsy, Right hepatic lobe - METASTATIC LOW GRADE NEUROENDOCRINE TUMOR (CARCINOID TUMOR). - SEE COMMENT.  Diagnosis 06/11/2016 1. Ileum, biopsy, terminal - INFLAMMATORY PROLAPSE TYPE POLYP. - NO DYSPLASIA OR MALIGNANCY. 2. Colon, polyp(s), ascending - TUBULAR ADENOMA (X3 FRAGMENTS). - NO HIGH GRADE DYSPLASIA OR MALIGNANCY. 3. Stomach, biopsy, distal - CHRONIC ACTIVE GASTRITIS. - NEGATIVE FOR HELICOBACTER PYLORI. - NO INTESTINAL METAPLASIA, DYSPLASIA, OR MALIGNANCY. - NO EVIDENCE OF CARCINOID.  Diagnosis 07/10/2016 Lung, needle/core biopsy(ies), Right Lower Lobe - NON SMALL CELL CARCINOMA.  US Paracentesis 10/27/16 IMPRESSION: Successful ultrasound-guided diagnostic and therapeutic paracentesis yielding 4 liters of peritoneal fluid. Pathology 10/27/16 Diagnosis PERITONEAL/ASCITIC FLUID (SPECIMEN 1 OF 1 COLLECTED 10/27/16): REACTIVE MESOTHELIAL CELLS. NO MALIGNANT CELLS IDENTIFIED.  RADIOGRAPHIC STUDIES: I have personally reviewed the radiological  images as listed and agreed with the findings in the report. Ct Abdomen Pelvis W Contrast  Result Date: 10/23/2016 CLINICAL DATA:  Followup metastatic neuroendocrine tumor to liver and right lower lobe lung adenocarcinoma. EXAM: CT ABDOMEN AND PELVIS WITH CONTRAST  TECHNIQUE: Multidetector CT imaging of the abdomen and pelvis was performed using the standard protocol following bolus administration of intravenous contrast. CONTRAST:  130m ISOVUE-300 IOPAMIDOL (ISOVUE-300) INJECTION 61% COMPARISON:  Dotatate PET-CT on 08/20/2016, and MRI on 05/24/2016 FINDINGS: Lower Chest: No acute findings. Hepatobiliary: Large hypervascular mass with central necrosis inferior right hepatic lobe measures 8.6 x 6.5 cm on image 35/2, and is stable in size since previous study. Multiple other smaller hypervascular metastases throughout the right and left hepatic lobes show no significant change in number or size compared to previous studies. Gallbladder is unremarkable. No evidence of biliary ductal dilatation. Pancreas:  No mass or inflammatory changes. Spleen: Within normal limits in size and appearance. Adrenals/Urinary Tract: No masses identified. No evidence of hydronephrosis. Stomach/Bowel: No evidence of obstruction, inflammatory process or abnormal fluid collections. Vascular/Lymphatic: No pathologically enlarged lymph nodes. No abdominal aortic aneurysm. Aortic atherosclerosis. Reproductive:  No mass identified. Other: Large amount of ascites is increased compared to most recent exam on 08/20/2016. Increased diffuse body wall edema also noted. New omental soft tissue thickening seen within the left abdomen, raising suspicion for peritoneal carcinomatosis. Musculoskeletal: Subtle small lytic bone metastases are again seen within the pelvis, without significant change. IMPRESSION: No significant interval change in diffuse hypervascular liver metastases. Stable subtle small lytic bone metastases within the pelvis. Increased  large amount of ascites and diffuse body wall edema. New left abdominal omental soft tissue thickening, suspicious for peritoneal carcinomatosis. Electronically Signed   By: JEarle GellM.D.   On: 10/23/2016 08:37   [place]  Result Date: 10/27/2016 INDICATION: Patient with history of metastatic carcinoid tumor and stage 1 lung adenocarcinoma, recurrent ascites. Request made for diagnostic and therapeutic paracentesis up to 4 liters. EXAM: ULTRASOUND GUIDED DIAGNOSTIC AND THERAPEUTIC PARACENTESIS MEDICATIONS: None. COMPLICATIONS: None immediate. PROCEDURE: Informed written consent was obtained from the patient after a discussion of the risks, benefits and alternatives to treatment. A timeout was performed prior to the initiation of the procedure. Initial ultrasound scanning demonstrates a large amount of ascites within the right lower abdominal quadrant. The right lower abdomen was prepped and draped in the usual sterile fashion. 1% lidocaine was used for local anesthesia. Following this, a Yueh catheter was introduced. An ultrasound image was saved for documentation purposes. The paracentesis was performed. The catheter was removed and a dressing was applied. The patient tolerated the procedure well without immediate post procedural complication. FINDINGS: A total of approximately 4 liters of light yellow fluid was removed. Samples were sent to the laboratory as requested by the clinical team. IMPRESSION: Successful ultrasound-guided diagnostic and therapeutic paracentesis yielding 4 liters of peritoneal fluid. Read by: KRowe Robert PA-C Electronically Signed   By: DLucrezia EuropeM.D.   On: 10/27/2016 11:30   EGD 06/11/2016 - Normal esophagus. - Normal examined duodenum. - Abnromal distal stomach, this was somewhat neoplastic appearing, biospied extensively. - The examination was otherwise normal.  Colonoscopy 06/11/2016: One abnormal appearing ileal polypoid leion in the terminal ileum. Biopsied. -  Two 8 to 11 mm polyps in the transverse colon and in the ascending colon, removed with a hot snare. Resected and retrieved. - One 5 mm polyp in the transverse colon, removed with a cold snare. Resected and retrieved. - Diverticulosis in the left colon.  ECHO 06/05/2016 - Left ventricle: The cavity size was normal. Left ventricular   diastolic function parameters were normal. - Ventricular septum: The contour showed diastolic flattening.   These changes are consistent with RV volume overload. -  Right ventricle: The cavity size was severely dilated. Systolic   function was mildly reduced. - Right atrium: The atrium was severely dilated. - Tricuspid valve: Mobility was restricted. There was malcoaptation   of the valve leaflets. There was wide-open regurgitation. 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  ASSESSMENT & PLAN:  61 y.o. male with past medical history of hypertension, hepatitis B infection, presented with worsening diarrhea for one and half years, fatigue, anorexia and weight loss.  1. 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 I spoke with radiologist Dr. Leonia Reeves and we think his primary carcinoid tumor is probably in the terminal ileum. The PET scan also reviewed multiple bone metastasis, he is not symptomatic from the bone mets.  -I previously discussed his liver biopsy results, which showed metastatic carcinoid tumor, low-grade -His EGD showed abnormal appearing of the distal stomach, however the biopsy only showed inflammatory change. Colonoscopy was also an negative except polyps, and biopsy was negative for carcinoid tumor. --We previously  discussed the nature history of metastatic carcinoid tumor. Due to his diffuse liver and bone metastasis, I do not think he is a candidate for upfront metastasectomy, and his disease is likely incurable. We discussed this is a indolent disease, and most people do well with treatment -the goal of therapy is palliative, to prolong his life and improve his quality of life  -He has previously started Sandostatin injection, tolerated well, but he is more symptomatic lately, with new ascites and fatigue  -his diarrhea has improved with Xermelo '250mg'$  2-3 times daily. I encourage him to take 3 times a day -Repeat CT abdomen and pelvis on 10/23/16 to evaluate his disease status. This showed no significant interval change in diffuse hypervascular liver metastases and stable subtle small lytic bone metastases within the pelvis. However, there was increased large amount of ascites and diffuse body wall edema and new left abdominal omental soft tissue thickening, suspicious for peritoneal carcinomatosis. -I arranged ultrasound of the abdomen and a paracentesis to be done. This was performed on 10/27/16 removing 4L of peritoneal fluid. This revealed reactive mesothelial cells, no malignant cells identified. 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 -His tumor marker chromogranin A and 24 hour urine HIAA level have also significantly increased lately, indicating disease progression of his carcinoid tumor.  -Due to the formation of ascites and disease progression in peritoneal on his CT scan from 10/23/16, I recommend him to change treatment. We discussed option of everolimus, chemotherapy, and the newly FDA approved [id number] when his insurance will approve it (hopefully soon). -I recommend him to start everolimus, potential side effects, which includes but not limited to, fatigue, mucositis, diarrhea, abnormal  liver function, cytopenias, risk of infection, etc., were discussed with him in details. He agrees to proceed. I sent the prescription to our oral chemotherapy pharmacist today. -The patient will continue Sandostatin injection for his carcinoid symptom control.  3. 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 Skin rashes -he previously had diffuse skin rashes on his trunk, not sure if it's related to his carcinoid syndrome. It's very itchy, and the bothersome. He has tried over-the-counter and prescription topical steroids, which does not help. - I previously referred him to dermatology, he has not had appointment yet   5. Adenocarcinoma of right lung, cT1N0M0, stage I  -His right lower lobe nodule is intensely hypermetabolic on PET scan, no significant adenopathy or other hypermetabolic lesion suggesting metastasis. Biopsy confirmed non-small cell lung cancer, consistent with adenocarcinoma. -He is s/p SBRT to his lung cancer, will follow up with Dr. Tammi Klippel  6. Bone Health -due to his diffuse bone metastasis, I recommend him to start Xgeva injection on 10/16/16. Potential side effects, especially jaw necrosis, were discussed with patient. I encouraged him to take calcium and vitamin D.  7. Goal of care discussion  -We previously discussed the incurable nature of his metastatic carcinoid tumor, it's very treatable and probably slow growing, but we are not able to cure it. Most pt can liver for several years.  -The patient understands the goal of care is palliative.  Plan - Sandostatin and Xgeva Injections in 2  and 6 weeks. -Lab and f/u in 3 weeks. -Will start the patient on Everolimus as soon as he receives the medication   All questions were answered. The patient knows to call the clinic with any problems, questions or concerns.  I spent 25 minutes counseling the patient face to face. The total time spent in the appointment was 30 minutes and more than 50% was on counseling.    Truitt Merle, MD 10/30/2016   This document serves as a record of services personally performed by Truitt Merle, MD. It was created on her behalf by Darcus Austin, a trained medical scribe. The creation of this record is based on the scribe's personal observations and the provider's statements to them. This document has been checked and approved by the attending provider.

## 2016-10-30 ENCOUNTER — Other Ambulatory Visit (HOSPITAL_BASED_OUTPATIENT_CLINIC_OR_DEPARTMENT_OTHER): Payer: Commercial Managed Care - PPO

## 2016-10-30 ENCOUNTER — Encounter: Payer: Self-pay | Admitting: Hematology

## 2016-10-30 ENCOUNTER — Ambulatory Visit (HOSPITAL_BASED_OUTPATIENT_CLINIC_OR_DEPARTMENT_OTHER): Payer: Commercial Managed Care - PPO | Admitting: Hematology

## 2016-10-30 DIAGNOSIS — R21 Rash and other nonspecific skin eruption: Secondary | ICD-10-CM | POA: Diagnosis not present

## 2016-10-30 DIAGNOSIS — R197 Diarrhea, unspecified: Secondary | ICD-10-CM | POA: Diagnosis not present

## 2016-10-30 DIAGNOSIS — E340 Carcinoid syndrome: Secondary | ICD-10-CM | POA: Diagnosis not present

## 2016-10-30 DIAGNOSIS — C7B03 Secondary carcinoid tumors of bone: Secondary | ICD-10-CM | POA: Diagnosis not present

## 2016-10-30 DIAGNOSIS — C7B02 Secondary carcinoid tumors of liver: Secondary | ICD-10-CM

## 2016-10-30 DIAGNOSIS — C3431 Malignant neoplasm of lower lobe, right bronchus or lung: Secondary | ICD-10-CM

## 2016-10-30 DIAGNOSIS — C7A00 Malignant carcinoid tumor of unspecified site: Secondary | ICD-10-CM

## 2016-10-30 DIAGNOSIS — C3491 Malignant neoplasm of unspecified part of right bronchus or lung: Secondary | ICD-10-CM | POA: Diagnosis not present

## 2016-10-30 LAB — COMPREHENSIVE METABOLIC PANEL
ALBUMIN: 2.9 g/dL — AB (ref 3.5–5.0)
ALT: 21 U/L (ref 0–55)
ANION GAP: 7 meq/L (ref 3–11)
AST: 31 U/L (ref 5–34)
Alkaline Phosphatase: 322 U/L — ABNORMAL HIGH (ref 40–150)
BILIRUBIN TOTAL: 0.67 mg/dL (ref 0.20–1.20)
BUN: 10.7 mg/dL (ref 7.0–26.0)
CALCIUM: 8.5 mg/dL (ref 8.4–10.4)
CO2: 31 mEq/L — ABNORMAL HIGH (ref 22–29)
CREATININE: 0.8 mg/dL (ref 0.7–1.3)
Chloride: 103 mEq/L (ref 98–109)
Glucose: 106 mg/dl (ref 70–140)
POTASSIUM: 4.9 meq/L (ref 3.5–5.1)
Sodium: 141 mEq/L (ref 136–145)
Total Protein: 5.9 g/dL — ABNORMAL LOW (ref 6.4–8.3)

## 2016-10-30 MED ORDER — EVEROLIMUS 10 MG PO TABS: 10.0000 mg | ORAL_TABLET | Freq: Every day | ORAL | 2 refills | Status: DC

## 2016-10-30 NOTE — Progress Notes (Signed)
Affinitor rx placed on oral chemo pharmacists desk.

## 2016-11-03 ENCOUNTER — Telehealth: Payer: Self-pay | Admitting: Hematology

## 2016-11-03 NOTE — Telephone Encounter (Signed)
Gave completed FMLA paperwork to nurse Janifer Adie to give to patient

## 2016-11-04 ENCOUNTER — Telehealth: Payer: Self-pay | Admitting: Pharmacist

## 2016-11-04 NOTE — Telephone Encounter (Signed)
Patient called regarding prescription.  He has not gotten this yet.  Discussed with [employer] [facility], they are in the process with this.  Have patient call us on Friday, if he has not heard anything.   Spoke with patient and let him know to call us on Friday if he has not heard anything.  He is fine with this.

## 2016-11-04 NOTE — Telephone Encounter (Signed)
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 Afinitor prescription faxed to [facility] outpatient pharmacy.  Labs reviewed - ok for treatment. Afinitor dose is appropriate for indication. Current medication list in epic assessed for drug/drug interactions: Xermelo/Afinitor:  Category B - Xermelo may decrease concentration of Afinitor, (although Xermelo at low doses such as '250mg'$  TID, Xermelo is then classified as a weak inducer) and the interaction is not significant.  Patient is taking lower dose Xermelo.  Will continue to follow, Raul Del, PharmD, BCPS, [facility] [phone]

## 2016-11-09 NOTE — Telephone Encounter (Signed)
"  I was seen, told to begin a new medication.  The doctor told me to call if no information received and I have not received any information."    Called [facility] about the Afinitor order.  Spoke with [place] who reports Afinitor requires prior authorization.  We left message for patient to call [phone] to determine which Pharmacy insurance requires him to use for the Afinitor.    Patient given this number and will notify oral chemotherapy pharmacist team.

## 2016-11-10 NOTE — Telephone Encounter (Signed)
Pharmacy was also routed this information yesterday as well.

## 2016-11-10 NOTE — Telephone Encounter (Signed)
Please consult oral pharmacy to see if we can give him Afinitor samples while we are waiting for the approval, and let me know if I need to call his insurance company. Thanks   Truitt Merle MD

## 2016-11-11 ENCOUNTER — Telehealth: Payer: Self-pay | Admitting: Hematology

## 2016-11-11 NOTE — Telephone Encounter (Signed)
Spoke with patients and confirmed all appointments

## 2016-11-12 NOTE — Telephone Encounter (Signed)
Oral Chemotherapy Pharmacist Encounter  PA request form completed. Faxed to [employer], along with supporting MD office note to [phone]  Once PA is approved we will ned to fax Afinitor prescription to [facility] ([phone], f:[phone]) for filling per insurance requirement.  Oral Oncology Clinic will continue to follow.  Johny Drilling, PharmD, BCPS, BCOP 11/12/2016  10:57 AM Oral Oncology Clinic [phone]

## 2016-11-12 NOTE — Telephone Encounter (Signed)
Oral Chemotherapy Pharmacist Encounter  Received notification from [place] that Afinitor requires specific specialty pharmacy for distribution. I called patient's PBM ([employer]) at [phone] They will fax [facility] form needed for prior authorization and contact information for their preferred specialty pharmacies.  I LVM for patient that we are still in the process of acquiring his Afinitor and can dispense samples to him when he comes for lab and injection appointments tomorrow (4/6).  Oral Oncology Clinic will continue to follow.  Johny Drilling, PharmD, BCPS, BCOP 11/12/2016  10:01 AM Oral Oncology Clinic [phone]

## 2016-11-12 NOTE — Telephone Encounter (Signed)
Great, thanks [place].   Truitt Merle MD

## 2016-11-13 ENCOUNTER — Ambulatory Visit (HOSPITAL_BASED_OUTPATIENT_CLINIC_OR_DEPARTMENT_OTHER): Payer: Commercial Managed Care - PPO

## 2016-11-13 ENCOUNTER — Other Ambulatory Visit (HOSPITAL_BASED_OUTPATIENT_CLINIC_OR_DEPARTMENT_OTHER): Payer: Commercial Managed Care - PPO

## 2016-11-13 ENCOUNTER — Encounter: Payer: Self-pay | Admitting: Pharmacist

## 2016-11-13 ENCOUNTER — Telehealth: Payer: Self-pay

## 2016-11-13 VITALS — BP 121/97 | HR 89 | Temp 97.7°F | Resp 18

## 2016-11-13 DIAGNOSIS — C7B03 Secondary carcinoid tumors of bone: Secondary | ICD-10-CM | POA: Diagnosis not present

## 2016-11-13 DIAGNOSIS — C7B02 Secondary carcinoid tumors of liver: Secondary | ICD-10-CM

## 2016-11-13 DIAGNOSIS — C7A00 Malignant carcinoid tumor of unspecified site: Secondary | ICD-10-CM

## 2016-11-13 LAB — CBC WITH DIFFERENTIAL/PLATELET
BASO%: 1.1 % (ref 0.0–2.0)
Basophils Absolute: 0.1 10*3/uL (ref 0.0–0.1)
EOS ABS: 0.1 10*3/uL (ref 0.0–0.5)
EOS%: 1.7 % (ref 0.0–7.0)
HCT: 45.5 % (ref 38.4–49.9)
HEMOGLOBIN: 14.9 g/dL (ref 13.0–17.1)
LYMPH%: 18 % (ref 14.0–49.0)
MCH: 32 pg (ref 27.2–33.4)
MCHC: 32.7 g/dL (ref 32.0–36.0)
MCV: 97.8 fL (ref 79.3–98.0)
MONO#: 0.6 10*3/uL (ref 0.1–0.9)
MONO%: 11.4 % (ref 0.0–14.0)
NEUT%: 67.8 % (ref 39.0–75.0)
NEUTROS ABS: 3.3 10*3/uL (ref 1.5–6.5)
Platelets: 266 10*3/uL (ref 140–400)
RBC: 4.65 10*6/uL (ref 4.20–5.82)
RDW: 17.3 % — AB (ref 11.0–14.6)
WBC: 4.9 10*3/uL (ref 4.0–10.3)
lymph#: 0.9 10*3/uL (ref 0.9–3.3)

## 2016-11-13 LAB — COMPREHENSIVE METABOLIC PANEL
ALBUMIN: 3 g/dL — AB (ref 3.5–5.0)
ALK PHOS: 347 U/L — AB (ref 40–150)
ALT: 21 U/L (ref 0–55)
AST: 32 U/L (ref 5–34)
Anion Gap: 8 mEq/L (ref 3–11)
BILIRUBIN TOTAL: 0.56 mg/dL (ref 0.20–1.20)
BUN: 9.5 mg/dL (ref 7.0–26.0)
CO2: 32 mEq/L — ABNORMAL HIGH (ref 22–29)
Calcium: 8.4 mg/dL (ref 8.4–10.4)
Chloride: 102 mEq/L (ref 98–109)
Creatinine: 0.8 mg/dL (ref 0.7–1.3)
GLUCOSE: 109 mg/dL (ref 70–140)
Potassium: 4.5 mEq/L (ref 3.5–5.1)
SODIUM: 142 meq/L (ref 136–145)
TOTAL PROTEIN: 6.2 g/dL — AB (ref 6.4–8.3)

## 2016-11-13 MED ORDER — DENOSUMAB 120 MG/1.7ML ~~LOC~~ SOLN
120.0000 mg | Freq: Once | SUBCUTANEOUS | Status: AC
  Administered 2016-11-13: 120 mg via SUBCUTANEOUS
  Filled 2016-11-13: qty 1.7

## 2016-11-13 MED ORDER — OCTREOTIDE ACETATE 30 MG IM KIT
30.0000 mg | PACK | Freq: Once | INTRAMUSCULAR | Status: AC
  Administered 2016-11-13: 30 mg via INTRAMUSCULAR
  Filled 2016-11-13: qty 1

## 2016-11-13 NOTE — Telephone Encounter (Signed)
I have called [facility], and his Afinitor is approved now for one year. The authorization number is [id number].   Truitt Merle MD

## 2016-11-13 NOTE — Telephone Encounter (Signed)
[facility] pharmacy called about afinitor. It will need prior auth. The phone # vor verbal PA is [phone] and is quicker. They will also fax a PA form .

## 2016-11-13 NOTE — Progress Notes (Signed)
Oral Chemotherapy Pharmacist Encounter  Dispensed samples to patient:  Medication: Afinitor 10 mg tablets Instructions: Take 1 tablet by mouth once daily Quantity dispensed: 7 Days supply: 7 Manufacturer: [employer] Lot: [id number] Exp: Oct 2018  Medication and information sheet left with [facility] nurse 2 in anticipation of patient's injection appointment today (11/13/16) at 4pm. Patient notified on 11/12/16 that samples would be waiting for him at this appointment.  Johny Drilling, PharmD, BCPS, BCOP 11/13/2016 10:50 AM Oral Oncology Clinic [phone]

## 2016-11-16 ENCOUNTER — Other Ambulatory Visit: Payer: Self-pay | Admitting: Pharmacist

## 2016-11-16 DIAGNOSIS — C7B02 Secondary carcinoid tumors of liver: Secondary | ICD-10-CM

## 2016-11-16 MED ORDER — EVEROLIMUS 10 MG PO TABS: 10.0000 mg | ORAL_TABLET | Freq: Every day | ORAL | 2 refills | Status: AC

## 2016-11-16 NOTE — Progress Notes (Signed)
E-scribed Afinitor to [employer] in [employer] for [employer]

## 2016-11-17 LAB — CHROMOGRANIN A: Chromogranin A: 221 nmol/L — ABNORMAL HIGH (ref 0–5)

## 2016-11-20 ENCOUNTER — Other Ambulatory Visit: Payer: Self-pay | Admitting: Hematology

## 2016-11-20 ENCOUNTER — Telehealth: Payer: Self-pay | Admitting: Medical Oncology

## 2016-11-20 ENCOUNTER — Telehealth: Payer: Self-pay | Admitting: *Deleted

## 2016-11-20 ENCOUNTER — Encounter: Payer: Self-pay | Admitting: Hematology

## 2016-11-20 ENCOUNTER — Telehealth: Payer: Self-pay | Admitting: Hematology

## 2016-11-20 ENCOUNTER — Other Ambulatory Visit: Payer: Commercial Managed Care - PPO

## 2016-11-20 ENCOUNTER — Ambulatory Visit (HOSPITAL_BASED_OUTPATIENT_CLINIC_OR_DEPARTMENT_OTHER): Payer: Commercial Managed Care - PPO | Admitting: Hematology

## 2016-11-20 VITALS — BP 122/91 | HR 130 | Temp 98.4°F | Resp 16 | Ht 67.0 in | Wt 162.4 lb

## 2016-11-20 DIAGNOSIS — C3431 Malignant neoplasm of lower lobe, right bronchus or lung: Secondary | ICD-10-CM

## 2016-11-20 DIAGNOSIS — C7B02 Secondary carcinoid tumors of liver: Secondary | ICD-10-CM | POA: Diagnosis not present

## 2016-11-20 DIAGNOSIS — E340 Carcinoid syndrome: Secondary | ICD-10-CM | POA: Diagnosis not present

## 2016-11-20 DIAGNOSIS — Z7189 Other specified counseling: Secondary | ICD-10-CM

## 2016-11-20 DIAGNOSIS — C7B03 Secondary carcinoid tumors of bone: Secondary | ICD-10-CM

## 2016-11-20 DIAGNOSIS — R188 Other ascites: Secondary | ICD-10-CM

## 2016-11-20 DIAGNOSIS — C3491 Malignant neoplasm of unspecified part of right bronchus or lung: Secondary | ICD-10-CM

## 2016-11-20 DIAGNOSIS — R21 Rash and other nonspecific skin eruption: Secondary | ICD-10-CM

## 2016-11-20 DIAGNOSIS — R5383 Other fatigue: Secondary | ICD-10-CM | POA: Diagnosis not present

## 2016-11-20 DIAGNOSIS — R609 Edema, unspecified: Secondary | ICD-10-CM

## 2016-11-20 DIAGNOSIS — C7A00 Malignant carcinoid tumor of unspecified site: Secondary | ICD-10-CM | POA: Diagnosis not present

## 2016-11-20 NOTE — Telephone Encounter (Signed)
Received fax from [place] with confirmation of dispense of afinitor on 11/18/16.

## 2016-11-20 NOTE — Telephone Encounter (Signed)
12/11/16 appointments rescheduled to 12/10/16 with follow up, per 11/20/16 los. Patient was given a copy of the AVS report and appointment schedule, per 11/20/16 los.

## 2016-11-20 NOTE — Telephone Encounter (Signed)
Left messages at vascular lab and IR to schedule 2 procedures- doppler left leg early next week for LL edema and r/o blood clot and for IR to schedule paracentesis by wed of next week.

## 2016-11-20 NOTE — Progress Notes (Signed)
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 No evidence of mediastinal node metastasis or distant metastasis. Groundglass nodule in the right upper lobe is indeterminate 8. No metabolic activity associated with the enhancing hepatic lesion in left and right hepatic lobes, fever up benign etiology or hemangiomas, but malignancy is not ruled out.      05/24/2016 Imaging    Abdomen MRI with and without contrast showed multiple enhancing liver lesions, the largest in the inferior right liver shows central necrosis, suspicious for malignancy.      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       06/11/2016 Procedure    Patient underwent EGD and colonoscopy by Dr. Ardis Mccormick, no significant tumor, to the ileum and distal stomach biopsy showed inflammatory change, no evidence of carcinoid. One polyp in ascending colon was removed, showed tubular adenoma.      06/22/2016 -  Chemotherapy    Ruben Mccormick injections every 4 weeks       10/23/2016 Imaging    CT ABD/PELVIS w/ Contrast on 10/23/16 IMPRESSION: No significant interval change in diffuse  hypervascular liver metastases. Stable subtle small lytic bone metastases within the pelvis. Increased large amount of ascites and diffuse body wall edema. New left abdominal omental soft tissue thickening, suspicious for peritoneal carcinomatosis.      10/27/2016 Procedure    US Paracentesis 10/27/16 IMPRESSION: Successful ultrasound-guided diagnostic and therapeutic paracentesis yielding 4 liters of peritoneal fluid.      10/27/2016 Pathology Results    Pathology 10/27/16 Diagnosis PERITONEAL/ASCITIC FLUID (SPECIMEN 1 OF 1 COLLECTED 10/27/16): REACTIVE MESOTHELIAL CELLS. NO MALIGNANT CELLS IDENTIFIED.      11/14/2016 -  Adjuvant Chemotherapy    Everolimus starting 11/14/16        Adenocarcinoma of right lower lobe of lung (Ruben Mccormick)   05/15/2016 Imaging    PET scan showed: 1. Intense metabolic activity associated with the RIGHT lower lobe pulmonary nodule is concerning for bronchogenic carcinoma. No evidence of mediastinal nodal metastasis or distant metastasis (potential stage IA). 2. Ground-glass nodule RIGHT upper lobe is indeterminate. This lesion will require follow-uphowed       06/19/2016 Procedure    Bronchoscopy biopsy of the right lung mass was negative for malignancy      07/10/2016 Initial Diagnosis    Adenocarcinoma of right lung (Ruben Mccormick)      07/10/2016 Procedure    CT-guided right lung mass biopsy showed non-small cell carcinoma, immunostaining was strongly positive for TTF-1, negative for CK 5/6, consistent with primary lung adenocarcinoma      07/31/2016 - 08/06/2016 Radiation Therapy    SBRT 54 Gy in 3 fractions of 18  Gy, by Dr. Tammi Mccormick        HISTORY OF PRESENTING ILLNESS (05/15/2016):     Ruben Mccormick 61 y.o. male with past medical history of hepatitis B, hypertension, heavy smoking, is here because of his recent abnormal CT scan, which showed multiple liver lesions concerning for malignancy. He was referred by his primary care physician Dr. Melford Mccormick.    He has  had diarrhea for 1.5 years, it has been getting worse lately, up 20 times a day. He was seen by Dr. Melford Mccormick and started taking Viberzi in 02/2016 which helps a lot. He underwent abdominal ultrasound, which showed a large lesion in the right lobe of the liver. He subsequently underwent abdominal and pelvis CT with contrast, which showed a dominant 7.6 cm necrotic mass in the inferior right lobe, and multiple other small lesions in the liver.   His last colonoscopy was 5 years ago which was normal per patient. He denies significant abdominal pain or discomfort, no melano or hemachizia, but he has mild heart burn and gassy feeling from [employer]. No other symptoms. He has chronic mild dyspnia from smoking. He lost about 25 lbs in the past 4-5 months. 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   He lives with his wife, has two daughters and one son. His previous hepatitis B test showed positive S antigen and E antibody, hepatitis C negative. Patient is on not aware of his prior hepatitis B infection. He did use cocaine, but no IV drugs. No history of blood transfusion.  CURRENT THERAPY:  somatostatin injection every 4 weeks, started on 06/22/2016. Everolimus started 11/14/16   INTERIM HISTORY  Ruben Mccormick returns for follow-up. The patient had a xgeva injection and Ruben Mccormick injection on 11/13/16. He reports a poor appetite and when he does eat he reports abdominal bloating. Reports he only at 1 egg this morning. He has  abdominal pain and he has more pain when he walks. He reports bilateral lower extremity edema from the thigh down. The swelling is more of the left leg. He states the edema has been presents for months. His legs hurt at times. He is fatigued. He can't even crank his weed eater. He continues to have itchy skin rashes on his trunk.  MEDICAL HISTORY:  Past Medical History:  Diagnosis Date  . History of kidney stones   .  Hyperlipidemia   . Hypertension   . Hypogonadism male   . Lung cancer ([place])   . Pre-diabetes   . Vitamin D deficiency     SURGICAL HISTORY: Past Surgical History:  Procedure Laterality Date  . APPENDECTOMY    . COLONOSCOPY WITH PROPOFOL N/A 06/11/2016   Procedure: COLONOSCOPY WITH PROPOFOL;  Surgeon: Milus Banister, MD;  Location: [facility];  Service: Endoscopy;  Laterality: N/A;  . ESOPHAGOGASTRODUODENOSCOPY (EGD) WITH PROPOFOL N/A 06/11/2016   Procedure: ESOPHAGOGASTRODUODENOSCOPY (EGD) WITH PROPOFOL;  Surgeon: Milus Banister, MD;  Location: [facility];  Service: Endoscopy;  Laterality: N/A;  . VIDEO BRONCHOSCOPY WITH ENDOBRONCHIAL NAVIGATION Right 06/19/2016   Procedure: VIDEO BRONCHOSCOPY WITH ENDOBRONCHIAL NAVIGATION with RLL brushings, washings and bx;  Surgeon: [name], MD;  Location: [place];  Service: Thoracic;  Laterality: Right;    SOCIAL HISTORY: Social History   Social History  . Marital status: Married    Spouse name: N/A  . Number of children: N/A  . Years of education: N/A   Occupational  History  . Not on file.   Social History Main Topics  . Smoking status: Former Smoker    Packs/day: 1.00    Years: 40.00    Types: Cigarettes    Quit date: 09/11/2015  . Smokeless tobacco: Never Used  . Alcohol use 16.8 oz/week    14 Shots of liquor, 14 Standard drinks or equivalent per week     Comment: 2 drinks daily since young   . Drug use: No     Comment: cocaine in the past, no iv drugs   . Sexual activity: Not on file   Other Topics Concern  . Not on file   Social History Narrative  . No narrative on file    FAMILY HISTORY: Family History  Problem Relation Age of Onset  . Diabetes Father   . Cancer Father     mesothelioma   . Alzheimer's disease Mother   . Parkinson's disease Mother   . Cancer Paternal Grandfather     colon    ALLERGIES:  is allergic to bee venom; ppd [tuberculin purified protein derivative]; and voltaren [diclofenac  sodium].  MEDICATIONS:  Current Outpatient Prescriptions  Medication Sig Dispense Refill  . aspirin 325 MG tablet Take 325 mg by mouth daily.    . Aspirin-Acetaminophen-Caffeine (GOODY HEADACHE PO) Take 1 packet by mouth daily as needed (headaches).    . B-D 3CC LUER-LOK SYR 21GX1" 21G X 1" 3 ML MISC USE AS DIRECTED FOR TESTOSTERONE 10 each 0  . Cholecalciferol (VITAMIN D-3) 5000 UNITS TABS Take 5,000 Units by mouth daily.     . Eluxadoline (VIBERZI) 100 MG TABS Take 1 tablet by mouth 2 (two) times daily. (Patient taking differently: Take 1 tablet by mouth daily. ) 180 tablet 0  . everolimus (AFINITOR) 10 MG tablet Take 1 tablet (10 mg total) by mouth daily. (Patient taking differently: Take 10 mg by mouth daily. Started 11/14/16) 30 tablet 2  . fluticasone (FLONASE) 50 MCG/ACT nasal spray INSTILL 2 SPRAYS INTO THE NOSTRILS AT NIGHT AS NEEDED FOR CONGESTION  5  . furosemide (LASIX) 40 MG tablet Take 1 tablet (40 mg total) by mouth daily. 30 tablet 11  . Multiple Vitamin (MULTIVITAMIN WITH MINERALS) TABS tablet Take 1 tablet by mouth daily.    Marland Kitchen PREVIDENT 5000 BOOSTER PLUS 1.1 % PSTE Take 1 application by mouth daily.   3  . Telotristat Etiprate (XERMELO) 250 MG TABS Take 250 mg by mouth 3 (three) times daily with meals. 90 tablet 2  . testosterone cypionate (DEPOTESTOSTERONE CYPIONATE) 200 MG/ML injection INJECT 2 ML INTO THE MUSCLE EVERY 2 WEEKS 10 mL 5  . Triamcinolone Acetonide (TRIAMCINOLONE 0.1 % CREAM : EUCERIN) CREA Apply 1 application topically 2 (two) times daily. 1 each 11  . umeclidinium-vilanterol (ANORO ELLIPTA) 62.5-25 MCG/INH AEPB Inhale 1 puff into the lungs daily. 21 each 0  . valACYclovir (VALTREX) 500 MG tablet TAKE 1 TABLET BY MOUTH TWICE DAILY AS NEEDED FOR FEVER BLISTER 90 tablet 1   Current Facility-Administered Medications  Medication Dose Route Frequency Provider Last Rate Last Dose  . ipratropium-albuterol (DUONEB) 0.5-2.5 (3) MG/3ML nebulizer solution 3 mL  3 mL  Nebulization Q6H Courtney Forcucci, PA-C        REVIEW OF SYSTEMS:  Constitutional: Denies fevers, chills or abnormal night sweats (+) fatigue (+) Flushing (+) Poor appetite Eyes: Denies blurriness of vision, double vision or watery eyes Ears, nose, mouth, throat, and face: Denies mucositis or sore throat Respiratory: Denies cough, dyspnea Cardiovascular: Denies  palpitation, chest discomfort (+) Lower extremity edema. Gastrointestinal:  Denies nausea, heartburn (+) Abdominal distention Skin:  (+) dry, itchy bumps on skin over abdomen and back Lymphatics: Denies new lymphadenopathy or easy bruising Neurological:Denies numbness, tingling or new weaknesses Behavioral/Psych: Mood is stable, no new changes  All other systems were reviewed with the patient and are negative.  PHYSICAL EXAMINATION:  ECOG PERFORMANCE STATUS: 2 Vitals:   11/20/16 1436  BP: (!) 122/91  Pulse: (!) 130  Resp: 16  Temp: 98.4 F (36.9 C)  TempSrc: Oral  Weight: 162 lb 6.4 oz (73.7 kg)  Height: '5\' 7"'$  (1.702 m)   GENERAL:alert, no distress and comfortable SKIN:  (+) very dry skin and sporadic scaly rash with scratch markers over abdomen and back (+) flushing EYES: normal, conjunctiva are pink and non-injected, sclera clear OROPHARYNX:no exudate, no erythema and lips, buccal mucosa, and tongue normal  NECK: supple, thyroid normal size, non-tender, without nodularity LYMPH:  no palpable lymphadenopathy in the cervical, axillary or inguinal LUNGS: clear to auscultation and percussion with normal breathing effort HEART: regular rate & rhythm and no murmurs and no lower extremity edema ABDOMEN:abdomen soft and normal bowel sounds, no organomegaly. (+) pain in middle abdomen (+) Large amount ascites. Musculoskeletal:no cyanosis of digits and no clubbing (+) swelling of bilateral lower extremities, more so of the left leg PSYCH: alert & oriented x 3 with fluent speech NEURO: no focal motor/sensory  deficits  LABORATORY DATA:  I have reviewed the data as listed CBC Latest Ref Rng & Units 11/13/2016 10/16/2016 09/18/2016  WBC 4.0 - 10.3 10e3/uL 4.9 4.5 3.8(L)  Hemoglobin 13.0 - 17.1 g/dL 14.9 14.4 14.0  Hematocrit 38.4 - 49.9 % 45.5 45.3 43.5  Platelets 140 - 400 10e3/uL 266 208 180   CMP Latest Ref Rng & Units 11/13/2016 10/30/2016 10/16/2016  Glucose 70 - 140 mg/dl 109 106 142(H)  BUN 7.0 - 26.0 mg/dL 9.5 10.7 13.7  Creatinine 0.7 - 1.3 mg/dL 0.8 0.8 1.0  Sodium 136 - 145 mEq/L 142 141 142  Potassium 3.5 - 5.1 mEq/L 4.5 4.9 4.3  Chloride 101 - 111 mmol/L - - -  CO2 22 - 29 mEq/L 32(H) 31(H) 34(H)  Calcium 8.4 - 10.4 mg/dL 8.4 8.5 9.2  Total Protein 6.4 - 8.3 g/dL 6.2(L) 5.9(L) 6.4  Total Bilirubin 0.20 - 1.20 mg/dL 0.56 0.67 0.70  Alkaline Phos 40 - 150 U/L 347(H) 322(H) 315(H)  AST 5 - 34 U/L 32 31 32  ALT 0 - 55 U/L '21 21 20   '$ PATHOLOGY REPORT  Diagnosis 06/08/2016 Liver, biopsy, Right hepatic lobe - METASTATIC LOW GRADE NEUROENDOCRINE TUMOR (CARCINOID TUMOR). - SEE COMMENT.  Diagnosis 06/11/2016 1. Ileum, biopsy, terminal - INFLAMMATORY PROLAPSE TYPE POLYP. - NO DYSPLASIA OR MALIGNANCY. 2. Colon, polyp(s), ascending - TUBULAR ADENOMA (X3 FRAGMENTS). - NO HIGH GRADE DYSPLASIA OR MALIGNANCY. 3. Stomach, biopsy, distal - CHRONIC ACTIVE GASTRITIS. - NEGATIVE FOR HELICOBACTER PYLORI. - NO INTESTINAL METAPLASIA, DYSPLASIA, OR MALIGNANCY. - NO EVIDENCE OF CARCINOID.  Diagnosis 07/10/2016 Lung, needle/core biopsy(ies), Right Lower Lobe - NON SMALL CELL CARCINOMA.  US Paracentesis 10/27/16 IMPRESSION: Successful ultrasound-guided diagnostic and therapeutic paracentesis yielding 4 liters of peritoneal fluid. Pathology 10/27/16 Diagnosis PERITONEAL/ASCITIC FLUID (SPECIMEN 1 OF 1 COLLECTED 10/27/16): REACTIVE MESOTHELIAL CELLS. NO MALIGNANT CELLS IDENTIFIED.  RADIOGRAPHIC STUDIES: I have personally reviewed the radiological images as listed and agreed with the findings in the  report. Ct Abdomen Pelvis W Contrast  Result Date: 10/23/2016 CLINICAL DATA:  Followup metastatic neuroendocrine tumor  to liver and right lower lobe lung adenocarcinoma. EXAM: CT ABDOMEN AND PELVIS WITH CONTRAST TECHNIQUE: Multidetector CT imaging of the abdomen and pelvis was performed using the standard protocol following bolus administration of intravenous contrast. CONTRAST:  179m ISOVUE-300 IOPAMIDOL (ISOVUE-300) INJECTION 61% COMPARISON:  Dotatate PET-CT on 08/20/2016, and MRI on 05/24/2016 FINDINGS: Lower Chest: No acute findings. Hepatobiliary: Large hypervascular mass with central necrosis inferior right hepatic lobe measures 8.6 x 6.5 cm on image 35/2, and is stable in size since previous study. Multiple other smaller hypervascular metastases throughout the right and left hepatic lobes show no significant change in number or size compared to previous studies. Gallbladder is unremarkable. No evidence of biliary ductal dilatation. Pancreas:  No mass or inflammatory changes. Spleen: Within normal limits in size and appearance. Adrenals/Urinary Tract: No masses identified. No evidence of hydronephrosis. Stomach/Bowel: No evidence of obstruction, inflammatory process or abnormal fluid collections. Vascular/Lymphatic: No pathologically enlarged lymph nodes. No abdominal aortic aneurysm. Aortic atherosclerosis. Reproductive:  No mass identified. Other: Large amount of ascites is increased compared to most recent exam on 08/20/2016. Increased diffuse body wall edema also noted. New omental soft tissue thickening seen within the left abdomen, raising suspicion for peritoneal carcinomatosis. Musculoskeletal: Subtle small lytic bone metastases are again seen within the pelvis, without significant change. IMPRESSION: No significant interval change in diffuse hypervascular liver metastases. Stable subtle small lytic bone metastases within the pelvis. Increased large amount of ascites and diffuse body wall edema.  New left abdominal omental soft tissue thickening, suspicious for peritoneal carcinomatosis. Electronically Signed   By: JEarle GellM.D.   On: 10/23/2016 08:37   [place]  Result Date: 10/27/2016 INDICATION: Patient with history of metastatic carcinoid tumor and stage 1 lung adenocarcinoma, recurrent ascites. Request made for diagnostic and therapeutic paracentesis up to 4 liters. EXAM: ULTRASOUND GUIDED DIAGNOSTIC AND THERAPEUTIC PARACENTESIS MEDICATIONS: None. COMPLICATIONS: None immediate. PROCEDURE: Informed written consent was obtained from the patient after a discussion of the risks, benefits and alternatives to treatment. A timeout was performed prior to the initiation of the procedure. Initial ultrasound scanning demonstrates a large amount of ascites within the right lower abdominal quadrant. The right lower abdomen was prepped and draped in the usual sterile fashion. 1% lidocaine was used for local anesthesia. Following this, a Yueh catheter was introduced. An ultrasound image was saved for documentation purposes. The paracentesis was performed. The catheter was removed and a dressing was applied. The patient tolerated the procedure well without immediate post procedural complication. FINDINGS: A total of approximately 4 liters of light yellow fluid was removed. Samples were sent to the laboratory as requested by the clinical team. IMPRESSION: Successful ultrasound-guided diagnostic and therapeutic paracentesis yielding 4 liters of peritoneal fluid. Read by: KRowe Robert PA-C Electronically Signed   By: DLucrezia EuropeM.D.   On: 10/27/2016 11:30   EGD 06/11/2016 - Normal esophagus. - Normal examined duodenum. - Abnromal distal stomach, this was somewhat neoplastic appearing, biospied extensively. - The examination was otherwise normal.  Colonoscopy 06/11/2016: One abnormal appearing ileal polypoid leion in the terminal ileum. Biopsied. - Two 8 to 11 mm polyps in the transverse colon and in  the ascending colon, removed with a hot snare. Resected and retrieved. - One 5 mm polyp in the transverse colon, removed with a cold snare. Resected and retrieved. - Diverticulosis in the left colon.  ECHO 06/05/2016 - Left ventricle: The cavity size was normal. Left ventricular   diastolic function parameters were normal. - Ventricular septum: The  contour showed diastolic flattening.   These changes are consistent with RV volume overload. - Right ventricle: The cavity size was severely dilated. Systolic   function was mildly reduced. - Right atrium: The atrium was severely dilated. - Tricuspid valve: Mobility was restricted. There was malcoaptation   of the valve leaflets. There was wide-open regurgitation. 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  ASSESSMENT & PLAN:  61 y.o. male with past medical history of hypertension, hepatitis B infection, presented with worsening diarrhea for one and half years, fatigue, anorexia and weight loss.  1. 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 I spoke with radiologist Dr. Leonia Reeves and we think his primary carcinoid tumor is probably in the terminal ileum. The PET scan also reviewed multiple bone metastasis, he is not symptomatic from the bone mets.  -I previously discussed his liver biopsy results, which showed metastatic carcinoid tumor, low-grade -His EGD showed abnormal appearing of the distal stomach, however the biopsy only showed inflammatory change. Colonoscopy was also an negative except polyps, and biopsy was negative for carcinoid tumor. --We previously discussed the nature history of metastatic carcinoid  tumor. Due to his diffuse liver and bone metastasis, I do not think he is a candidate for upfront metastasectomy, and his disease is likely incurable. We discussed this is a indolent disease, and most people do well with treatment -the goal of therapy is palliative, to prolong his life and improve his quality of life  -He has previously started Ruben Mccormick injection, tolerated well, but he is more symptomatic lately, with new ascites and fatigue  -his diarrhea has improved with Xermelo '250mg'$  2-3 times daily. I encourage him to take 3 times a day -Repeat CT abdomen and pelvis on 10/23/16 to evaluate his disease status. This showed no significant interval change in diffuse hypervascular liver metastases and stable subtle small lytic bone metastases within the pelvis. However, there was increased large amount of ascites and diffuse body wall edema and new left abdominal omental soft tissue thickening, suspicious for peritoneal carcinomatosis. -I arranged ultrasound of the abdomen and a paracentesis to be done. This was performed on 10/27/16 removing 4L of peritoneal fluid. This revealed reactive mesothelial cells, no malignant cells identified. 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 -His tumor marker chromogranin A and 24 hour urine HIAA level have also significantly increased lately, indicating disease progression of his carcinoid tumor.  -Due to the formation of ascites and disease progression in peritoneal on his CT scan from 10/23/16, I recommend him to change treatment. We previously discussed option of everolimus, chemotherapy, and the newly FDA approved [id number]-Dotatate when his insurance will approve it (hopefully soon). -He has started Everolimus, tolerates OK with fatigue and anorexia  -The patient's abdominal ascites is worsening and so is his bilateral lower extremity edema. He will have  paracentesis and Doppler early next week. We discussed that his condition is progressively becoming worse and he understands. -Repeat CT scan in 6-8 weeks. If he has further disease progression, I may consider a biopsy to rule out metastatic disease from lung cancer  3. Carcinoid syndrome -He previously had  chronic diarrhea, vasodilation and discoloration on his face, and tricuspid valve abnormality and a severe right heart dilatation secondary to his carcinoid syndrome -He will continue to follow with cardiology -Continue Ruben Mccormick injection, I may increase his dose due to his persistent symptoms -I previously started him on Xermelo for his diarrhea which is working well, will continue   4. Skin rashes -he previously had diffuse skin rashes on his trunk, not sure if it's related to his carcinoid syndrome. It's very itchy, and the bothersome. He has tried over-the-counter and prescription topical steroids, which does not help. - I previously referred him to dermatology, he has not had appointment yet   5. Adenocarcinoma of right lung, cT1N0M0, stage I  -His right lower lobe nodule is intensely hypermetabolic on PET scan, no significant adenopathy or other hypermetabolic lesion suggesting metastasis. Biopsy confirmed non-small cell lung cancer, consistent with adenocarcinoma. -He is s/p SBRT to his lung cancer, will follow up with Dr. Tammi Mccormick  6. Bone Health -due to his diffuse bone metastasis, I recommended him to start monthly Xgeva injection on 10/16/16. Potential side effects, especially jaw necrosis, were discussed with patient. I encouraged him to take calcium and vitamin D.  7. Goal of care discussion  -We previously discussed the incurable nature of his metastatic carcinoid tumor, it's very treatable and probably slow growing, but we are not able to cure it. Most pt can liver for several years.  -The patient understands the goal of care is palliative.  8. Insurance -The patient's  insurance is [place] -ID: [id number], Group: [phone] [phone] -Phone Number: [phone] -We will contact the patient's insurance to see if they will approve get coverage for [id number]-Dotatate therapy   Plan - Ruben Mccormick and Xgeva Injections in 3 weeks. -Paracentesis by IR early next wek. -Doppler of his legs to rule out DVT early next week. -Lab, injection and f/u on 12/10/16.  All questions were answered. The patient knows to call the clinic with any problems, questions or concerns.  I spent 25 minutes counseling the patient face to face. The total time spent in the appointment was 30 minutes and more than 50% was on counseling.    Ruben Merle, MD 11/20/2016   This document serves as a record of services personally performed by Ruben Merle, MD. It was created on her behalf by Darcus Austin, a trained medical scribe. The creation of this record is based on the scribe's personal observations and the provider's statements to them. This document has been checked and approved by the attending provider.

## 2016-11-23 LAB — 5 HIAA, QUANTITATIVE, URINE, 24 HOUR
5-HIAA, URINE: 243 mg/L
5-HIAA,Quant.,24 Hr Urine: 109.4 mg/24 hr — ABNORMAL HIGH (ref 0.0–14.9)

## 2016-11-24 ENCOUNTER — Ambulatory Visit (HOSPITAL_COMMUNITY)
Admission: RE | Admit: 2016-11-24 | Discharge: 2016-11-24 | Disposition: A | Discharge status: Home / Self Care | Payer: Commercial Managed Care - PPO | Source: Ambulatory Visit | Attending: Hematology | Admitting: Hematology

## 2016-11-24 DIAGNOSIS — C7B02 Secondary carcinoid tumors of liver: Secondary | ICD-10-CM | POA: Insufficient documentation

## 2016-11-24 DIAGNOSIS — R188 Other ascites: Secondary | ICD-10-CM | POA: Insufficient documentation

## 2016-11-24 DIAGNOSIS — C7A00 Malignant carcinoid tumor of unspecified site: Secondary | ICD-10-CM | POA: Insufficient documentation

## 2016-11-24 NOTE — Procedures (Signed)
Ultrasound-guided diagnostic and therapeutic paracentesis performed yielding 5 liters (maximum ordered) of yellow  fluid. No immediate complications.  A portion of the fluid was sent to the lab for cytology.

## 2016-11-27 ENCOUNTER — Ambulatory Visit (HOSPITAL_COMMUNITY)
Admission: RE | Admit: 2016-11-27 | Discharge: 2016-11-27 | Disposition: A | Discharge status: Home / Self Care | Payer: Commercial Managed Care - PPO | Source: Ambulatory Visit | Attending: Hematology | Admitting: Hematology

## 2016-11-27 ENCOUNTER — Telehealth: Payer: Self-pay | Admitting: *Deleted

## 2016-11-27 DIAGNOSIS — R600 Localized edema: Secondary | ICD-10-CM | POA: Insufficient documentation

## 2016-11-27 DIAGNOSIS — C7B02 Secondary carcinoid tumors of liver: Secondary | ICD-10-CM | POA: Diagnosis not present

## 2016-11-27 DIAGNOSIS — C7A00 Malignant carcinoid tumor of unspecified site: Secondary | ICD-10-CM | POA: Diagnosis not present

## 2016-11-27 NOTE — Telephone Encounter (Signed)
Preliminary call report from vascular lab: Doppler is negative. Pt will be discharged home.  Message to MD.

## 2016-11-27 NOTE — Progress Notes (Signed)
*  PRELIMINARY RESULTS* Vascular Ultrasound Bilateral lower extremity venous duplex has been completed.  Preliminary findings: No evidence of deep vein thrombosis or baker's cysts bilaterally.  Massive edema thoughout bilateral lower extremities, somewhat difficult to compress due to tightness of skin.  Preliminary results called to Dr. Ernestina Penna office at 11:40, given to [facility].   Everrett Coombe 11/27/2016, 11:41 AM

## 2016-11-30 NOTE — Telephone Encounter (Signed)
Oral Chemotherapy Pharmacist Encounter  Received notification from [employer] that prior authorization of patient's Afinitor has been approved Effective dates: 11/23/16-11/07/17 PA# [id number]  Johny Drilling, PharmD, BCPS, BCOP 11/30/2016  4:53 PM [facility] [phone]

## 2016-12-02 ENCOUNTER — Encounter: Payer: Self-pay | Admitting: Hematology

## 2016-12-09 NOTE — Progress Notes (Signed)
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 No evidence of mediastinal node metastasis or distant metastasis. Groundglass nodule in the right upper lobe is indeterminate 8. No metabolic activity associated with the enhancing hepatic lesion in left and right hepatic lobes, fever up benign etiology or hemangiomas, but malignancy is not ruled out.      05/24/2016 Imaging    Abdomen MRI with and without contrast showed multiple enhancing liver lesions, the largest in the inferior right liver shows central necrosis, suspicious for malignancy.      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       06/11/2016 Procedure    Patient underwent EGD and colonoscopy by Dr. Ardis Hughs, no significant tumor, to the ileum and distal stomach biopsy showed inflammatory change, no evidence of carcinoid. One polyp in ascending colon was removed, showed tubular adenoma.      06/22/2016 -  Chemotherapy    Sandostatin injections every 4 weeks       10/23/2016 Imaging    CT ABD/PELVIS w/ Contrast on 10/23/16 IMPRESSION: No significant interval change in diffuse  hypervascular liver metastases. Stable subtle small lytic bone metastases within the pelvis. Increased large amount of ascites and diffuse body wall edema. New left abdominal omental soft tissue thickening, suspicious for peritoneal carcinomatosis.      10/27/2016 Procedure    US Paracentesis 10/27/16 IMPRESSION: Successful ultrasound-guided diagnostic and therapeutic paracentesis yielding 4 liters of peritoneal fluid.      10/27/2016 Pathology Results    Pathology 10/27/16 Diagnosis PERITONEAL/ASCITIC FLUID (SPECIMEN 1 OF 1 COLLECTED 10/27/16): REACTIVE MESOTHELIAL CELLS. NO MALIGNANT CELLS IDENTIFIED.      11/14/2016 -  Adjuvant Chemotherapy    Everolimus starting 11/14/16       11/14/2016 -  Chemotherapy    Everolimus started 11/14/16       11/24/2016 Pathology Results    Diagnosis PERITONEAL/ASCITIC FLUID (SPECIMEN 1 OF 1 COLLECTED 11/24/16): REACTIVE MESOTHELIAL CELLS PRESENT. MIXED LYMPHOID POPULATION.      11/24/2016 Procedure    US Paracentesis IMPRESSION: Successful ultrasound-guided diagnostic and therapeutic paracentesis yielding 5 liters of peritoneal fluid.        Adenocarcinoma of right lower lobe of lung ([place])   05/15/2016 Imaging    PET scan showed: 1. Intense metabolic activity associated with the RIGHT lower lobe pulmonary nodule is concerning for bronchogenic carcinoma. No evidence of mediastinal nodal metastasis or distant metastasis (potential stage IA). 2. Ground-glass nodule RIGHT upper lobe is indeterminate. This lesion will require follow-uphowed       06/19/2016 Procedure    Bronchoscopy biopsy of the right lung mass was negative for malignancy  07/10/2016 Initial Diagnosis    Adenocarcinoma of right lung ([place])      07/10/2016 Procedure    CT-guided right lung mass biopsy showed non-small cell carcinoma, immunostaining was strongly positive for TTF-1, negative for CK 5/6, consistent with primary lung adenocarcinoma      07/31/2016 -  08/06/2016 Radiation Therapy    SBRT 54 Gy in 3 fractions of 18 Gy, by Dr. Tammi Klippel        HISTORY OF PRESENTING ILLNESS (05/15/2016):     Ruben Mccormick 61 y.o. male with past medical history of hepatitis B, hypertension, heavy smoking, is here because of his recent abnormal CT scan, which showed multiple liver lesions concerning for malignancy. He was referred by his primary care physician Dr. Melford Aase.    He has had diarrhea for 1.5 years, it has been getting worse lately, up 20 times a day. He was seen by Dr. Melford Aase and started taking Viberzi in 02/2016 which helps a lot. He underwent abdominal ultrasound, which showed a large lesion in the right lobe of the liver. He subsequently underwent abdominal and pelvis CT with contrast, which showed a dominant 7.6 cm necrotic mass in the inferior right lobe, and multiple other small lesions in the liver.   His last colonoscopy was 5 years ago which was normal per patient. He denies significant abdominal pain or discomfort, no melano or hemachizia, but he has mild heart burn and gassy feeling from [employer]. No other symptoms. He has chronic mild dyspnia from smoking. He lost about 25 lbs in the past 4-5 months. 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   He lives with his wife, has two daughters and one son. His previous hepatitis B test showed positive S antigen and E antibody, hepatitis C negative. Patient is on not aware of his prior hepatitis B infection. He did use cocaine, but no IV drugs. No history of blood transfusion.  CURRENT THERAPY:  somatostatin injection every 4 weeks, started on 06/22/2016. Everolimus started 11/14/16.   INTERIM HISTORY  Ruben Mccormick returns for follow-up. The patient reports he feels worse and has more fatigue. He does not have the energy to do many tasks other than basic activities of daily life such as cooking and laundry. He has difficulty walking up  stairs or for long distances. He reports pain and bloating to his abdomen. He reports a poor appetite; he eats small portions and is full quickly. When he eats too much he finds he must lay down to alleviate is abdominal discomfort. He sits or lies down more than half of the day. The patient is on short-term disability at this time.   MEDICAL HISTORY:  Past Medical History:  Diagnosis Date  . History of kidney stones   . Hyperlipidemia   . Hypertension   . Hypogonadism male   . Lung cancer ([place])   . Pre-diabetes   . Vitamin D deficiency     SURGICAL HISTORY: Past Surgical History:  Procedure Laterality Date  . APPENDECTOMY    . COLONOSCOPY WITH PROPOFOL N/A 06/11/2016   Procedure: COLONOSCOPY WITH PROPOFOL;  Surgeon: Milus Banister, MD;  Location: [facility];  Service: Endoscopy;  Laterality: N/A;  . ESOPHAGOGASTRODUODENOSCOPY (EGD) WITH PROPOFOL N/A 06/11/2016   Procedure: ESOPHAGOGASTRODUODENOSCOPY (EGD) WITH PROPOFOL;  Surgeon: Milus Banister, MD;  Location: [facility];  Service: Endoscopy;  Laterality: N/A;  . VIDEO BRONCHOSCOPY WITH ENDOBRONCHIAL NAVIGATION Right  06/19/2016   Procedure: VIDEO BRONCHOSCOPY WITH ENDOBRONCHIAL NAVIGATION with RLL brushings, washings and bx;  Surgeon: [name], MD;  Location: [facility];  Service: Thoracic;  Laterality: Right;    SOCIAL HISTORY: Social History   Social History  . Marital status: Married    Spouse name: N/A  . Number of children: N/A  . Years of education: N/A   Occupational History  . Not on file.   Social History Main Topics  . Smoking status: Former Smoker    Packs/day: 1.00    Years: 40.00    Types: Cigarettes    Quit date: 09/11/2015  . Smokeless tobacco: Never Used  . Alcohol use 16.8 oz/week    14 Shots of liquor, 14 Standard drinks or equivalent per week     Comment: 2 drinks daily since young   . Drug use: No     Comment: cocaine in the past, no iv drugs   . Sexual activity: Not on file   Other Topics  Concern  . Not on file   Social History Narrative  . No narrative on file  Patient is on short-term disability at this time (12/09/16)  FAMILY HISTORY: Family History  Problem Relation Age of Onset  . Diabetes Father   . Cancer Father     mesothelioma   . Alzheimer's disease Mother   . Parkinson's disease Mother   . Cancer Paternal Grandfather     colon    ALLERGIES:  is allergic to bee venom; ppd [tuberculin purified protein derivative]; and voltaren [diclofenac sodium].  MEDICATIONS:  Current Outpatient Prescriptions  Medication Sig Dispense Refill  . aspirin 325 MG tablet Take 325 mg by mouth daily.    . Aspirin-Acetaminophen-Caffeine (GOODY HEADACHE PO) Take 1 packet by mouth daily as needed (headaches).    . B-D 3CC LUER-LOK SYR 21GX1" 21G X 1" 3 ML MISC USE AS DIRECTED FOR TESTOSTERONE 10 each 0  . Cholecalciferol (VITAMIN D-3) 5000 UNITS TABS Take 5,000 Units by mouth daily.     Marland Kitchen everolimus (AFINITOR) 10 MG tablet Take 1 tablet (10 mg total) by mouth daily. (Patient taking differently: Take 10 mg by mouth daily. Started 11/14/16) 30 tablet 2  . fluticasone (FLONASE) 50 MCG/ACT nasal spray INSTILL 2 SPRAYS INTO THE NOSTRILS AT NIGHT AS NEEDED FOR CONGESTION  5  . furosemide (LASIX) 40 MG tablet Take 1 tablet (40 mg total) by mouth daily. 30 tablet 11  . Multiple Vitamin (MULTIVITAMIN WITH MINERALS) TABS tablet Take 1 tablet by mouth daily.    Marland Kitchen PREVIDENT 5000 BOOSTER PLUS 1.1 % PSTE Take 1 application by mouth daily.   3  . Telotristat Etiprate (XERMELO) 250 MG TABS Take 250 mg by mouth 3 (three) times daily with meals. 90 tablet 2  . testosterone cypionate (DEPOTESTOSTERONE CYPIONATE) 200 MG/ML injection INJECT 2 ML INTO THE MUSCLE EVERY 2 WEEKS 10 mL 5  . Triamcinolone Acetonide (TRIAMCINOLONE 0.1 % CREAM : EUCERIN) CREA Apply 1 application topically 2 (two) times daily. 1 each 11  . umeclidinium-vilanterol (ANORO ELLIPTA) 62.5-25 MCG/INH AEPB Inhale 1 puff into the lungs  daily. 21 each 0  . valACYclovir (VALTREX) 500 MG tablet TAKE 1 TABLET BY MOUTH TWICE DAILY AS NEEDED FOR FEVER BLISTER 90 tablet 1   Current Facility-Administered Medications  Medication Dose Route Frequency Provider Last Rate Last Dose  . ipratropium-albuterol (DUONEB) 0.5-2.5 (3) MG/3ML nebulizer solution 3 mL  3 mL Nebulization Q6H Starlyn Skeans, PA-C  Facility-Administered Medications Ordered in Other Visits  Medication Dose Route Frequency Provider Last Rate Last Dose  . octreotide (SANDOSTATIN LAR) IM injection 30 mg  30 mg Intramuscular Once Truitt Merle, MD        REVIEW OF SYSTEMS:  Constitutional: Denies fevers, chills or abnormal night sweats (+) significant fatigue (+) Flushing (+) Poor appetite Eyes: Denies blurriness of vision, double vision or watery eyes Ears, nose, mouth, throat, and face: Denies mucositis or sore throat Respiratory: Denies cough, dyspnea Cardiovascular: Denies palpitation, chest discomfort (+) Lower extremity edema. Gastrointestinal:  Denies nausea, heartburn (+) Abdominal distention, (+) pain Skin:  (+) dry, itchy bumps on skin over abdomen and back, (+) skin darkening is subsiding Lymphatics: Denies new lymphadenopathy or easy bruising Neurological:Denies numbness, tingling or new weaknesses Behavioral/Psych: Mood is stable, no new changes  All other systems were reviewed with the patient and are negative.  PHYSICAL EXAMINATION:  ECOG PERFORMANCE STATUS: 2 Vitals:   12/10/16 1035  BP: 129/82  Pulse: 78  Resp: 18  Temp: 97.7 F (36.5 C)  TempSrc: Oral  SpO2: 99%  Weight: 159 lb 11.2 oz (72.4 kg)  Height: '5\' 7"'$  (1.702 m)   GENERAL:alert, no distress and comfortable SKIN:  (+) very dry skin and sporadic scaly rash with scratch markers over abdomen and back (+) flushing EYES: normal, conjunctiva are pink and non-injected, sclera clear OROPHARYNX:no exudate, no erythema and lips, buccal mucosa, and tongue normal  NECK: supple, thyroid  normal size, non-tender, without nodularity LYMPH:  no palpable lymphadenopathy in the cervical, axillary or inguinal LUNGS: clear to auscultation and percussion with normal breathing effort HEART: regular rate & rhythm and no murmurs and no lower extremity edema ABDOMEN:abdomen soft and normal bowel sounds, no organomegaly. (+) pain in middle abdomen (+) Large amount ascites, skin rash improved since last exam Musculoskeletal:no cyanosis of digits and no clubbing (+) swelling of bilateral lower extremities, more so of the left leg PSYCH: alert & oriented x 3 with fluent speech NEURO: no focal motor/sensory deficits  LABORATORY DATA:  I have reviewed the data as listed CBC Latest Ref Rng & Units 12/10/2016 11/13/2016 10/16/2016  WBC 4.0 - 10.3 10e3/uL 2.4(L) 4.9 4.5  Hemoglobin 13.0 - 17.1 g/dL 14.1 14.9 14.4  Hematocrit 38.4 - 49.9 % 43.6 45.5 45.3  Platelets 140 - 400 10e3/uL 168 266 208   CMP Latest Ref Rng & Units 12/10/2016 11/13/2016 10/30/2016  Glucose 70 - 140 mg/dl 163(H) 109 106  BUN 7.0 - 26.0 mg/dL 10.6 9.5 10.7  Creatinine 0.7 - 1.3 mg/dL 0.8 0.8 0.8  Sodium 136 - 145 mEq/L 142 142 141  Potassium 3.5 - 5.1 mEq/L 4.8 4.5 4.9  Chloride 101 - 111 mmol/L - - -  CO2 22 - 29 mEq/L 29 32(H) 31(H)  Calcium 8.4 - 10.4 mg/dL 8.7 8.4 8.5  Total Protein 6.4 - 8.3 g/dL 6.1(L) 6.2(L) 5.9(L)  Total Bilirubin 0.20 - 1.20 mg/dL 0.41 0.56 0.67  Alkaline Phos 40 - 150 U/L 358(H) 347(H) 322(H)  AST 5 - 34 U/L 38(H) 32 31  ALT 0 - 55 U/L '20 21 21   '$ PATHOLOGY REPORT  Diagnosis 06/08/2016 Liver, biopsy, Right hepatic lobe - METASTATIC LOW GRADE NEUROENDOCRINE TUMOR (CARCINOID TUMOR). - SEE COMMENT.  Diagnosis 06/11/2016 1. Ileum, biopsy, terminal - INFLAMMATORY PROLAPSE TYPE POLYP. - NO DYSPLASIA OR MALIGNANCY. 2. Colon, polyp(s), ascending - TUBULAR ADENOMA (X3 FRAGMENTS). - NO HIGH GRADE DYSPLASIA OR MALIGNANCY. 3. Stomach, biopsy, distal - CHRONIC ACTIVE GASTRITIS. - NEGATIVE  FOR  HELICOBACTER PYLORI. - NO INTESTINAL METAPLASIA, DYSPLASIA, OR MALIGNANCY. - NO EVIDENCE OF CARCINOID.  Diagnosis 11/24/16 PERITONEAL/ASCITIC FLUID (SPECIMEN 1 OF 1 COLLECTED 11/24/16): REACTIVE MESOTHELIAL CELLS PRESENT. MIXED LYMPHOID POPULATION.  Diagnosis 07/10/2016 Lung, needle/core biopsy(ies), Right Lower Lobe - NON SMALL CELL CARCINOMA.  PROCEDURES US Paracentesis 10/27/16 IMPRESSION: Successful ultrasound-guided diagnostic and therapeutic paracentesis yielding 4 liters of peritoneal fluid. Pathology 10/27/16 Diagnosis PERITONEAL/ASCITIC FLUID (SPECIMEN 1 OF 1 COLLECTED 10/27/16): REACTIVE MESOTHELIAL CELLS. NO MALIGNANT CELLS IDENTIFIED.  US Paracentesis 11/24/16 IMPRESSION: Successful ultrasound-guided diagnostic and therapeutic paracentesis yielding 5 liters of peritoneal fluid. Diagnosis  PERITONEAL/ASCITIC FLUID (SPECIMEN 1 OF 1 COLLECTED 11/24/16): REACTIVE MESOTHELIAL CELLS PRESENT. MIXED LYMPHOID POPULATION.  RADIOGRAPHIC STUDIES: I have personally reviewed the radiological images as listed and agreed with the findings in the report. US Paracentesis  Result Date: 11/24/2016 INDICATION: Patient with history of metastatic malignant carcinoid to the liver, right lung adenocarcinoma, recurrent ascites. Request made for diagnostic and therapeutic paracentesis up to 5 liters. EXAM: ULTRASOUND GUIDED DIAGNOSTIC AND THERAPEUTIC PARACENTESIS MEDICATIONS: None. COMPLICATIONS: None immediate. PROCEDURE: Informed written consent was obtained from the patient after a discussion of the risks, benefits and alternatives to treatment. A timeout was performed prior to the initiation of the procedure. Initial ultrasound scanning demonstrates a large amount of ascites within the right lower abdominal quadrant. The right lower abdomen was prepped and draped in the usual sterile fashion. 1% lidocaine was used for local anesthesia. Following this, a Yueh catheter was introduced. An ultrasound  image was saved for documentation purposes. The paracentesis was performed. The catheter was removed and a dressing was applied. The patient tolerated the procedure well without immediate post procedural complication. FINDINGS: A total of approximately 5 liters of yellow fluid was removed. Samples were sent to the laboratory as requested by the clinical team. IMPRESSION: Successful ultrasound-guided diagnostic and therapeutic paracentesis yielding 5 liters of peritoneal fluid. Read by: Rowe Robert, PA-C Electronically Signed   By: Corrie Mckusick D.O.   On: 11/24/2016 11:08   EGD 06/11/2016 - Normal esophagus. - Normal examined duodenum. - Abnromal distal stomach, this was somewhat neoplastic appearing, biospied extensively. - The examination was otherwise normal.  Colonoscopy 06/11/2016: One abnormal appearing ileal polypoid leion in the terminal ileum. Biopsied. - Two 8 to 11 mm polyps in the transverse colon and in the ascending colon, removed with a hot snare. Resected and retrieved. - One 5 mm polyp in the transverse colon, removed with a cold snare. Resected and retrieved. - Diverticulosis in the left colon.  ECHO 06/05/2016 - Left ventricle: The cavity size was normal. Left ventricular   diastolic function parameters were normal. - Ventricular septum: The contour showed diastolic flattening.   These changes are consistent with RV volume overload. - Right ventricle: The cavity size was severely dilated. Systolic   function was mildly reduced. - Right atrium: The atrium was severely dilated. - Tricuspid valve: Mobility was restricted. There was malcoaptation   of the valve leaflets. There was wide-open regurgitation. 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  ASSESSMENT & PLAN:  61 y.o. male with past medical history of hypertension, hepatitis B infection, presented with worsening diarrhea for one and half years, fatigue, anorexia and weight  loss.  1. 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 I spoke with radiologist Dr. Leonia Reeves and we think his primary carcinoid tumor is probably in the terminal ileum. The PET scan also reviewed multiple bone metastasis, he is not symptomatic from the bone mets.  -I previously discussed his liver biopsy results, which showed metastatic carcinoid tumor, low-grade -His EGD showed abnormal appearing of the distal stomach, however the biopsy only showed inflammatory change. Colonoscopy was also an negative except polyps, and biopsy was negative for carcinoid tumor. --We previously discussed the nature history of metastatic carcinoid tumor. Due to his diffuse liver and bone metastasis, I do not think he is a candidate for upfront metastasectomy, and his disease is likely incurable. We discussed this is a indolent disease, and most people do well with treatment -the goal of therapy is palliative, to prolong his life and improve his quality of life  -He has previously started Sandostatin injection, tolerated well, but he is more symptomatic lately, with new ascites and fatigue  -his diarrhea has improved with Xermelo '250mg'$  2-3 times daily. I encourage him to take 3 times a day, will continue -Repeat CT abdomen and pelvis on 10/23/16 to evaluate his disease status. This showed no significant interval change in diffuse hypervascular liver metastases and stable subtle small lytic bone metastases within the pelvis. However, there was increased large amount of ascites and diffuse body wall edema and new left abdominal omental soft tissue thickening, suspicious for peritoneal carcinomatosis. -I arranged ultrasound of the abdomen  and a paracentesis to be done. This was performed on 10/27/16 removing 4L of peritoneal fluid. This revealed reactive mesothelial cells, no malignant cells identified. 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 -His tumor marker chromogranin A and 24 hour urine HIAA level have also significantly increased lately, indicating disease progression of his carcinoid tumor. -He has started second line therapy with Everolimus, tolerates OK with fatigue and anorexia, he has noticed his skin changes (rashes improved, hyperpigmentation of face has improved) -The patient's abdominal ascites is worsening and so is his bilateral lower extremity edema. He will have paracentesis and Doppler early next week.. - Labs reviewed. Platelets normal, WBC slightly low which is likely due to Afinitor. -- I will order a repeat PET-DOTATATE scan following paracentesis for restage. 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 - Continue Afinitor for now. -Our LU177-DOTATATE (Lutathera) is almost ready to start, I will initiate his insurance approval for the treatment  - The patient has a stationary bike at home and I encouraged him to use it regularly to reduce muscle loss. I encouraged him to maintain activity to reduce fatigue. The patient is in agreement.  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 Skin rashes -he  previously had diffuse skin rashes on his trunk, not sure if it's related to his carcinoid syndrome. It's very itchy, and the bothersome. He has tried over-the-counter and  prescription topical steroids, which does not help. -This is improved since he started Afinitor. His skin rash is probably related to carcinoid syndrome.  5. Adenocarcinoma of right lung, cT1N0M0, stage I  -His right lower lobe nodule is intensely hypermetabolic on PET scan, no significant adenopathy or other hypermetabolic lesion suggesting metastasis. Biopsy confirmed non-small cell lung cancer, consistent with adenocarcinoma. -He is s/p SBRT to his lung cancer, will follow up with Dr. Tammi Klippel  6. Bone Health -due to his diffuse bone metastasis, I recommended him to start monthly Xgeva injection on 10/16/16. Potential side effects, especially jaw necrosis, were discussed with patient. I previously encouraged him to take calcium and vitamin D. -He is tolerating well, we'll continue  7. Goal of care discussion  -We previously discussed the incurable nature of his metastatic carcinoid tumor, it's very treatable and probably slow growing, but we are not able to cure it. Most pt can liver for several years.  -The patient understands the goal of care is palliative.  8. Insurance -The patient's insurance is [employer] -ID: [id number], Group: [id number] -Phone Number: [phone] -We will initiate the process of insurance approval for lu-177-Dotatate therapy. - Requested the patient be listed as high priority.  Plan - Labs reviewed today. - Hold Sandostatin injection today due to upcoming PET scan.. - Proceed with Xgeva injection today. - Continue Afinitor. No refills needed. - I will order paracentesis and subsequent PET-DOTATATE scan for restaging purposes next week. - Likely ready to begin 177-Dotatate therapy in the next month pending insurance approval. - Follow up in 2 weeks.   All questions were answered. The patient knows  to call the clinic with any problems, questions or concerns.  I spent 25 minutes counseling the patient face to face. The total time spent in the appointment was 30 minutes and more than 50% was on counseling.  This document serves as a record of services personally performed by Truitt Merle, MD. It was created on her behalf by Maryla Morrow, a trained medical scribe. The creation of this record is based on the scribe's personal observations and the provider's statements to them. This document has been checked and approved by the attending provider.    Truitt Merle, MD 12/10/2016

## 2016-12-10 ENCOUNTER — Ambulatory Visit (HOSPITAL_BASED_OUTPATIENT_CLINIC_OR_DEPARTMENT_OTHER): Payer: Commercial Managed Care - PPO | Admitting: Hematology

## 2016-12-10 ENCOUNTER — Telehealth: Payer: Self-pay | Admitting: Hematology

## 2016-12-10 ENCOUNTER — Ambulatory Visit (HOSPITAL_BASED_OUTPATIENT_CLINIC_OR_DEPARTMENT_OTHER): Payer: Commercial Managed Care - PPO

## 2016-12-10 ENCOUNTER — Other Ambulatory Visit (HOSPITAL_BASED_OUTPATIENT_CLINIC_OR_DEPARTMENT_OTHER): Payer: Commercial Managed Care - PPO

## 2016-12-10 VITALS — BP 129/82 | HR 78 | Temp 97.7°F | Resp 18 | Ht 67.0 in | Wt 159.7 lb

## 2016-12-10 DIAGNOSIS — C7B02 Secondary carcinoid tumors of liver: Secondary | ICD-10-CM | POA: Diagnosis not present

## 2016-12-10 DIAGNOSIS — C3431 Malignant neoplasm of lower lobe, right bronchus or lung: Secondary | ICD-10-CM

## 2016-12-10 DIAGNOSIS — C7B03 Secondary carcinoid tumors of bone: Secondary | ICD-10-CM

## 2016-12-10 DIAGNOSIS — K5730 Diverticulosis of large intestine without perforation or abscess without bleeding: Secondary | ICD-10-CM

## 2016-12-10 DIAGNOSIS — R21 Rash and other nonspecific skin eruption: Secondary | ICD-10-CM | POA: Diagnosis not present

## 2016-12-10 DIAGNOSIS — E340 Carcinoid syndrome: Secondary | ICD-10-CM | POA: Diagnosis not present

## 2016-12-10 DIAGNOSIS — C7A00 Malignant carcinoid tumor of unspecified site: Secondary | ICD-10-CM

## 2016-12-10 LAB — COMPREHENSIVE METABOLIC PANEL
ALBUMIN: 2.7 g/dL — AB (ref 3.5–5.0)
ALK PHOS: 358 U/L — AB (ref 40–150)
ALT: 20 U/L (ref 0–55)
AST: 38 U/L — AB (ref 5–34)
Anion Gap: 9 mEq/L (ref 3–11)
BUN: 10.6 mg/dL (ref 7.0–26.0)
CALCIUM: 8.7 mg/dL (ref 8.4–10.4)
CHLORIDE: 105 meq/L (ref 98–109)
CO2: 29 mEq/L (ref 22–29)
CREATININE: 0.8 mg/dL (ref 0.7–1.3)
EGFR: >90 mL/min/{1.73_m2} (ref >90)
GLUCOSE: 163 mg/dL — AB (ref 70–140)
POTASSIUM: 4.8 meq/L (ref 3.5–5.1)
SODIUM: 142 meq/L (ref 136–145)
Total Bilirubin: 0.41 mg/dL (ref 0.20–1.20)
Total Protein: 6.1 g/dL — ABNORMAL LOW (ref 6.4–8.3)

## 2016-12-10 LAB — CBC WITH DIFFERENTIAL/PLATELET
BASO%: 0.8 % (ref 0.0–2.0)
Basophils Absolute: 0 10*3/uL (ref 0.0–0.1)
EOS%: 3.3 % (ref 0.0–7.0)
Eosinophils Absolute: 0.1 10*3/uL (ref 0.0–0.5)
HEMATOCRIT: 43.6 % (ref 38.4–49.9)
HEMOGLOBIN: 14.1 g/dL (ref 13.0–17.1)
LYMPH#: 0.6 10*3/uL — AB (ref 0.9–3.3)
LYMPH%: 26.8 % (ref 14.0–49.0)
MCH: 30.7 pg (ref 27.2–33.4)
MCHC: 32.3 g/dL (ref 32.0–36.0)
MCV: 94.8 fL (ref 79.3–98.0)
MONO#: 0.3 10*3/uL (ref 0.1–0.9)
MONO%: 11.3 % (ref 0.0–14.0)
NEUT#: 1.4 10*3/uL — ABNORMAL LOW (ref 1.5–6.5)
NEUT%: 57.8 % (ref 39.0–75.0)
Platelets: 168 10*3/uL (ref 140–400)
RBC: 4.6 10*6/uL (ref 4.20–5.82)
RDW: 16.3 % — AB (ref 11.0–14.6)
WBC: 2.4 10*3/uL — AB (ref 4.0–10.3)

## 2016-12-10 MED ORDER — OCTREOTIDE ACETATE 30 MG IM KIT
30.0000 mg | PACK | Freq: Once | INTRAMUSCULAR | Status: DC
  Filled 2016-12-10: qty 1

## 2016-12-10 MED ORDER — DENOSUMAB 120 MG/1.7ML ~~LOC~~ SOLN
120.0000 mg | Freq: Once | SUBCUTANEOUS | Status: AC
  Administered 2016-12-10: 120 mg via SUBCUTANEOUS
  Filled 2016-12-10: qty 1.7

## 2016-12-10 NOTE — Telephone Encounter (Signed)
Gave patient  AVS and calender per 5/3 los. - [facility] to contact patient with Paracentesis and PET scan .

## 2016-12-10 NOTE — Patient Instructions (Signed)

## 2016-12-11 ENCOUNTER — Other Ambulatory Visit: Payer: Commercial Managed Care - PPO

## 2016-12-11 ENCOUNTER — Ambulatory Visit: Payer: Commercial Managed Care - PPO

## 2016-12-12 ENCOUNTER — Encounter: Payer: Self-pay | Admitting: Hematology

## 2016-12-14 LAB — CHROMOGRANIN A: CHROMOGRAN A: 99 nmol/L — AB (ref 0–5)

## 2016-12-16 ENCOUNTER — Encounter: Payer: Self-pay | Admitting: Hematology

## 2016-12-16 NOTE — Progress Notes (Signed)
Patient called back, will come in on Friday 7/61/60  to sign application /income infor for Lutathera application. Per Denny Peon at [facility] ref# [id number] need to submitted Medical notes for pre-determination) for Lutathera. Faxed notes status pending.

## 2016-12-16 NOTE — Progress Notes (Signed)
Patient will be in on Friday 12/18/16 to sigh Lutathera application and provide income information.  Application sent to Dr. Burr Medico for signature.

## 2016-12-18 ENCOUNTER — Ambulatory Visit (HOSPITAL_COMMUNITY)
Admission: RE | Admit: 2016-12-18 | Discharge: 2016-12-18 | Disposition: A | Discharge status: Home / Self Care | Payer: Commercial Managed Care - PPO | Source: Ambulatory Visit | Attending: Hematology | Admitting: Hematology

## 2016-12-18 DIAGNOSIS — C7B02 Secondary carcinoid tumors of liver: Secondary | ICD-10-CM | POA: Insufficient documentation

## 2016-12-18 DIAGNOSIS — R188 Other ascites: Secondary | ICD-10-CM | POA: Insufficient documentation

## 2016-12-18 NOTE — Procedures (Signed)
Ultrasound-guided diagnostic and therapeutic paracentesis performed yielding 5 liters (maximum ordered)of yellow fluid. No immediate complications. A portion of the fluid was sent to the lab for preordered studies.

## 2016-12-23 ENCOUNTER — Telehealth: Payer: Self-pay | Admitting: *Deleted

## 2016-12-23 NOTE — Telephone Encounter (Signed)
Called & left message with Courtney/Radiology/Scheduling to f/u on scheduling PET.  Talked with pt & informed not to come tomorrow if PET not scheduled & we will r/s when we know when PET will be done

## 2016-12-24 ENCOUNTER — Telehealth: Payer: Self-pay | Admitting: *Deleted

## 2016-12-24 ENCOUNTER — Encounter: Payer: Self-pay | Admitting: Hematology

## 2016-12-24 ENCOUNTER — Ambulatory Visit: Payer: Commercial Managed Care - PPO | Admitting: Hematology

## 2016-12-24 NOTE — Progress Notes (Unsigned)
Per [facility] Pre-determination is needed for Lutathera. Faxed medical notes to [facility] on 5/9 and 5/15. Faxed application to [facility] 12/18/2016. Case pending with [facility]. Called Megan on 12/24/16 at [facility] to advise case pending.

## 2016-12-24 NOTE — Telephone Encounter (Signed)
Spoke with pt and informed him that PET ( special scan ) is scheduled for Thursday  01/07/17 at 0700 am.  Informed pt that written instructions will be left in envelope at front desk for pt to pick up.   Pt voiced understanding and stated he would pick up instructions today.

## 2016-12-25 LAB — 5 HIAA, QUANTITATIVE, URINE, 24 HOUR
5-HIAA, URINE: 144 mg/L
5-HIAA,QUANT.,24 HR URINE: 50.4 mg/(24.h) — AB (ref 0.0–14.9)

## 2017-01-07 ENCOUNTER — Encounter (HOSPITAL_COMMUNITY)
Admission: RE | Admit: 2017-01-07 | Discharge: 2017-01-07 | Disposition: A | Discharge status: Home / Self Care | Payer: Commercial Managed Care - PPO | Source: Ambulatory Visit | Attending: Hematology | Admitting: Hematology

## 2017-01-07 DIAGNOSIS — C7B02 Secondary carcinoid tumors of liver: Secondary | ICD-10-CM | POA: Insufficient documentation

## 2017-01-07 MED ORDER — GALLIUM GA 68 DOTATATE IV KIT
4.1000 | PACK | Freq: Once | INTRAVENOUS | Status: AC
  Administered 2017-01-07: 4.1 via INTRAVENOUS

## 2017-01-15 ENCOUNTER — Other Ambulatory Visit: Payer: Self-pay | Admitting: *Deleted

## 2017-01-15 ENCOUNTER — Telehealth: Payer: Self-pay | Admitting: *Deleted

## 2017-01-15 DIAGNOSIS — R188 Other ascites: Secondary | ICD-10-CM

## 2017-01-15 NOTE — Telephone Encounter (Signed)
Received call from pt's wife who sounds upset & crying stating that pt is not doing as well & she has seen the results of his PET & would like to discuss with Dr Burr Medico.  She reports that he is not eating as well & has blurry vision but is still driving.  Returned call after discussing with Dr Burr Medico & asked pt to come in at 2pm on Monday to see Dr Burr Medico. LOS sent  Called & scheduled paracentesis for tues at 10:30 am & informed wife.

## 2017-01-18 ENCOUNTER — Other Ambulatory Visit (HOSPITAL_BASED_OUTPATIENT_CLINIC_OR_DEPARTMENT_OTHER): Payer: Commercial Managed Care - PPO

## 2017-01-18 ENCOUNTER — Telehealth: Payer: Self-pay | Admitting: Hematology

## 2017-01-18 ENCOUNTER — Ambulatory Visit (HOSPITAL_BASED_OUTPATIENT_CLINIC_OR_DEPARTMENT_OTHER): Payer: Commercial Managed Care - PPO | Admitting: Hematology

## 2017-01-18 ENCOUNTER — Encounter: Payer: Self-pay | Admitting: Hematology

## 2017-01-18 VITALS — BP 135/91 | HR 63 | Temp 97.5°F | Resp 18 | Ht 67.0 in | Wt 153.6 lb

## 2017-01-18 DIAGNOSIS — R5383 Other fatigue: Secondary | ICD-10-CM | POA: Diagnosis not present

## 2017-01-18 DIAGNOSIS — C3431 Malignant neoplasm of lower lobe, right bronchus or lung: Secondary | ICD-10-CM | POA: Diagnosis not present

## 2017-01-18 DIAGNOSIS — C7B03 Secondary carcinoid tumors of bone: Secondary | ICD-10-CM

## 2017-01-18 DIAGNOSIS — C7B02 Secondary carcinoid tumors of liver: Secondary | ICD-10-CM | POA: Diagnosis not present

## 2017-01-18 DIAGNOSIS — R109 Unspecified abdominal pain: Secondary | ICD-10-CM | POA: Diagnosis not present

## 2017-01-18 DIAGNOSIS — R188 Other ascites: Secondary | ICD-10-CM

## 2017-01-18 DIAGNOSIS — E340 Carcinoid syndrome: Secondary | ICD-10-CM

## 2017-01-18 DIAGNOSIS — R21 Rash and other nonspecific skin eruption: Secondary | ICD-10-CM | POA: Diagnosis not present

## 2017-01-18 DIAGNOSIS — R634 Abnormal weight loss: Secondary | ICD-10-CM

## 2017-01-18 DIAGNOSIS — R609 Edema, unspecified: Secondary | ICD-10-CM | POA: Diagnosis not present

## 2017-01-18 DIAGNOSIS — Z7189 Other specified counseling: Secondary | ICD-10-CM

## 2017-01-18 DIAGNOSIS — R630 Anorexia: Secondary | ICD-10-CM | POA: Diagnosis not present

## 2017-01-18 LAB — COMPREHENSIVE METABOLIC PANEL
ALT: 71 U/L — AB (ref 0–55)
ANION GAP: 9 meq/L (ref 3–11)
AST: 169 U/L — ABNORMAL HIGH (ref 5–34)
Albumin: 2.7 g/dL — ABNORMAL LOW (ref 3.5–5.0)
Alkaline Phosphatase: 465 U/L — ABNORMAL HIGH (ref 40–150)
BUN: 36.3 mg/dL — ABNORMAL HIGH (ref 7.0–26.0)
CALCIUM: 8.4 mg/dL (ref 8.4–10.4)
CHLORIDE: 106 meq/L (ref 98–109)
CO2: 28 meq/L (ref 22–29)
Creatinine: 0.9 mg/dL (ref 0.7–1.3)
Glucose: 61 mg/dl — ABNORMAL LOW (ref 70–140)
POTASSIUM: 4.9 meq/L (ref 3.5–5.1)
Sodium: 143 mEq/L (ref 136–145)
Total Bilirubin: 0.46 mg/dL (ref 0.20–1.20)
Total Protein: 6.3 g/dL — ABNORMAL LOW (ref 6.4–8.3)

## 2017-01-18 LAB — CBC WITH DIFFERENTIAL/PLATELET
BASO%: 0.3 % (ref 0.0–2.0)
BASOS ABS: 0 10*3/uL (ref 0.0–0.1)
EOS%: 1 % (ref 0.0–7.0)
Eosinophils Absolute: 0 10*3/uL (ref 0.0–0.5)
HEMATOCRIT: 40.3 % (ref 38.4–49.9)
HGB: 13.1 g/dL (ref 13.0–17.1)
LYMPH%: 15.8 % (ref 14.0–49.0)
MCH: 30.7 pg (ref 27.2–33.4)
MCHC: 32.5 g/dL (ref 32.0–36.0)
MCV: 94.4 fL (ref 79.3–98.0)
MONO#: 0.3 10*3/uL (ref 0.1–0.9)
MONO%: 8.9 % (ref 0.0–14.0)
NEUT#: 2.3 10*3/uL (ref 1.5–6.5)
NEUT%: 74 % (ref 39.0–75.0)
PLATELETS: 182 10*3/uL (ref 140–400)
RBC: 4.27 10*6/uL (ref 4.20–5.82)
RDW: 15.7 % — ABNORMAL HIGH (ref 11.0–14.6)
WBC: 3 10*3/uL — ABNORMAL LOW (ref 4.0–10.3)
lymph#: 0.5 10*3/uL — ABNORMAL LOW (ref 0.9–3.3)

## 2017-01-18 MED ORDER — OXYCODONE HCL 5 MG PO TABS: 5.0000 mg | ORAL_TABLET | ORAL | 0 refills | Status: AC | PRN

## 2017-01-18 MED ORDER — MEGESTROL ACETATE 625 MG/5ML PO SUSP: 625.0000 mg | Freq: Every day | ORAL | 0 refills | Status: DC

## 2017-01-18 NOTE — Progress Notes (Signed)
NO SHOW

## 2017-01-18 NOTE — Telephone Encounter (Signed)
Injection appointment added for tomorrow,per 01/18/17 los. Lab and follow up scheduled for 2 weeks, per 01/18/17 los. Patient was given a copy of the AVS report and appointment schedule, per 01/18/17 los.

## 2017-01-18 NOTE — Progress Notes (Signed)
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 No evidence of mediastinal node metastasis or distant metastasis. Groundglass nodule in the right upper lobe is indeterminate 8. No metabolic activity associated with the enhancing hepatic lesion in left and right hepatic lobes, fever up benign etiology or hemangiomas, but malignancy is not ruled out.      05/24/2016 Imaging    Abdomen MRI with and without contrast showed multiple enhancing liver lesions, the largest in the inferior right liver shows central necrosis, suspicious for malignancy.      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       06/11/2016 Procedure    Patient underwent EGD and colonoscopy by Dr. Ardis Hughs, no significant tumor, to the ileum and distal stomach biopsy showed inflammatory change, no evidence of carcinoid. One polyp in ascending colon was removed, showed tubular adenoma.      06/22/2016 -  Chemotherapy    Sandostatin injections every 4 weeks       10/23/2016 Imaging    CT ABD/PELVIS w/ Contrast on 10/23/16 IMPRESSION: No significant interval change in diffuse  hypervascular liver metastases. Stable subtle small lytic bone metastases within the pelvis. Increased large amount of ascites and diffuse body wall edema. New left abdominal omental soft tissue thickening, suspicious for peritoneal carcinomatosis.      10/27/2016 Procedure    US Paracentesis 10/27/16 IMPRESSION: Successful ultrasound-guided diagnostic and therapeutic paracentesis yielding 4 liters of peritoneal fluid.      10/27/2016 Pathology Results    Pathology 10/27/16 Diagnosis PERITONEAL/ASCITIC FLUID (SPECIMEN 1 OF 1 COLLECTED 10/27/16): REACTIVE MESOTHELIAL CELLS. NO MALIGNANT CELLS IDENTIFIED.      11/14/2016 -  Adjuvant Chemotherapy    Everolimus starting 11/14/16       11/14/2016 -  Chemotherapy    Everolimus started 11/14/16       11/24/2016 Procedure    US Paracentesis IMPRESSION: Successful ultrasound-guided diagnostic and therapeutic paracentesis yielding 5 liters of peritoneal fluid.       11/24/2016 Pathology Results    Diagnosis PERITONEAL/ASCITIC FLUID (SPECIMEN 1 OF 1 COLLECTED 11/24/16): REACTIVE MESOTHELIAL CELLS PRESENT. MIXED LYMPHOID POPULATION.      12/18/2016 Procedure    US Paracentesis 12/18/16 IMPRESSION: Successful ultrasound-guided diagnostic/therapeutic paracentesis yielding 5 liters of peritoneal fluid.      01/07/2017 PET scan    PET-DOTATATE 01/07/17 IMPRESSION: 1. Mild increase in radiotracer activity within hepatic lesions, upper abdominal lymph nodes, terminal ileum lesion, and skeletal metastasis. 2. No new lesions are identified. 3. New large volume ascites. No evidence of peritoneal carcinomatosis.       Adenocarcinoma of right lower lobe of lung ([place])   05/15/2016  Imaging    PET scan showed: 1. Intense metabolic activity associated with the RIGHT lower lobe pulmonary nodule is concerning for bronchogenic carcinoma. No evidence of mediastinal nodal metastasis or distant metastasis (potential stage IA). 2. Ground-glass nodule  RIGHT upper lobe is indeterminate. This lesion will require follow-uphowed       06/19/2016 Procedure    Bronchoscopy biopsy of the right lung mass was negative for malignancy      07/10/2016 Initial Diagnosis    Adenocarcinoma of right lung ([place])      07/10/2016 Procedure    CT-guided right lung mass biopsy showed non-small cell carcinoma, immunostaining was strongly positive for TTF-1, negative for CK 5/6, consistent with primary lung adenocarcinoma      07/31/2016 - 08/06/2016 Radiation Therapy    SBRT 54 Gy in 3 fractions of 18 Gy, by Dr. Tammi Klippel        HISTORY OF PRESENTING ILLNESS (05/15/2016):     Ruben Mccormick 61 y.o. male with past medical history of hepatitis B, hypertension, heavy smoking, is here because of his recent abnormal CT scan, which showed multiple liver lesions concerning for malignancy. He was referred by his primary care physician Dr. Melford Aase.    He has had diarrhea for 1.5 years, it has been getting worse lately, up 20 times a day. He was seen by Dr. Melford Aase and started taking Viberzi in 02/2016 which helps a lot. He underwent abdominal ultrasound, which showed a large lesion in the right lobe of the liver. He subsequently underwent abdominal and pelvis CT with contrast, which showed a dominant 7.6 cm necrotic mass in the inferior right lobe, and multiple other small lesions in the liver.   His last colonoscopy was 5 years ago which was normal per patient. He denies significant abdominal pain or discomfort, no melano or hemachizia, but he has mild heart burn and gassy feeling from [employer]. No other symptoms. He has chronic mild dyspnia from smoking. He lost about 25 lbs in the past 4-5 months. 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   He lives with his wife, has two daughters and one son. His previous hepatitis B test showed positive S antigen and E antibody, hepatitis C negative. Patient is  on not aware of his prior hepatitis B infection. He did use cocaine, but no IV drugs. No history of blood transfusion.  CURRENT THERAPY:  somatostatin injection every 4 weeks, started on 06/22/2016. Everolimus started 11/14/16.  Pending LU177-DOTATATE (Lutathera) therapy.  INTERIM HISTORY  Ruben Mccormick returns for follow-up. The patient reports abdominal pain and bloating as a 7/10 that is getting worse. He denies taking any pain medication. The patient reports a poor appetite. He is only drinking 2-3 oz of water a day. He has diarrhea for which he takes Xermelo. He is fatigued to the point he is only able to bathe and use the bathroom. has lower extremity edema, and cannot walk certain distances. He reports feeling dizzy and blurry vision.  MEDICAL HISTORY:  Past Medical History:  Diagnosis Date  . History of kidney stones   . Hyperlipidemia   . Hypertension   . Hypogonadism male   . Lung cancer ([place])   . Pre-diabetes   . Vitamin D deficiency     SURGICAL HISTORY: Past Surgical History:  Procedure Laterality Date  . APPENDECTOMY    . COLONOSCOPY WITH PROPOFOL N/A 06/11/2016   Procedure: COLONOSCOPY WITH  PROPOFOL;  Surgeon: Milus Banister, MD;  Location: [facility];  Service: Endoscopy;  Laterality: N/A;  . ESOPHAGOGASTRODUODENOSCOPY (EGD) WITH PROPOFOL N/A 06/11/2016   Procedure: ESOPHAGOGASTRODUODENOSCOPY (EGD) WITH PROPOFOL;  Surgeon: Milus Banister, MD;  Location: [facility];  Service: Endoscopy;  Laterality: N/A;  . VIDEO BRONCHOSCOPY WITH ENDOBRONCHIAL NAVIGATION Right 06/19/2016   Procedure: VIDEO BRONCHOSCOPY WITH ENDOBRONCHIAL NAVIGATION with RLL brushings, washings and bx;  Surgeon: [name], MD;  Location: [facility];  Service: Thoracic;  Laterality: Right;    SOCIAL HISTORY: Social History   Social History  . Marital status: Married    Spouse name: N/A  . Number of children: N/A  . Years of education: N/A   Occupational History  . Not on file.   Social History  Main Topics  . Smoking status: Former Smoker    Packs/day: 1.00    Years: 40.00    Types: Cigarettes    Quit date: 09/11/2015  . Smokeless tobacco: Never Used  . Alcohol use 16.8 oz/week    14 Shots of liquor, 14 Standard drinks or equivalent per week     Comment: 2 drinks daily since young   . Drug use: No     Comment: cocaine in the past, no iv drugs   . Sexual activity: Not on file   Other Topics Concern  . Not on file   Social History Narrative  . No narrative on file  Patient is on short-term disability at this time (12/09/16)  FAMILY HISTORY: Family History  Problem Relation Age of Onset  . Diabetes Father   . Cancer Father        mesothelioma   . Alzheimer's disease Mother   . Parkinson's disease Mother   . Cancer Paternal Grandfather        colon    ALLERGIES:  is allergic to bee venom; ppd [tuberculin purified protein derivative]; and voltaren [diclofenac sodium].  MEDICATIONS:  Current Outpatient Prescriptions  Medication Sig Dispense Refill  . aspirin 325 MG tablet Take 325 mg by mouth daily.    . Cholecalciferol (VITAMIN D-3) 5000 UNITS TABS Take 5,000 Units by mouth daily.     Marland Kitchen everolimus (AFINITOR) 10 MG tablet Take 1 tablet (10 mg total) by mouth daily. (Patient taking differently: Take 10 mg by mouth daily. Started 11/14/16) 30 tablet 2  . fluticasone (FLONASE) 50 MCG/ACT nasal spray INSTILL 2 SPRAYS INTO THE NOSTRILS AT NIGHT AS NEEDED FOR CONGESTION  5  . furosemide (LASIX) 40 MG tablet Take 1 tablet (40 mg total) by mouth daily. (Patient taking differently: Take 40 mg by mouth daily as needed. ) 30 tablet 11  . Multiple Vitamin (MULTIVITAMIN WITH MINERALS) TABS tablet Take 1 tablet by mouth daily.    Marland Kitchen PREVIDENT 5000 BOOSTER PLUS 1.1 % PSTE Take 1 application by mouth daily.   3  . Aspirin-Acetaminophen-Caffeine (GOODY HEADACHE PO) Take 1 packet by mouth daily as needed (headaches).    . B-D 3CC LUER-LOK SYR 21GX1" 21G X 1" 3 ML MISC USE AS DIRECTED FOR  TESTOSTERONE (Patient not taking: Reported on 01/18/2017) 10 each 0  . megestrol (MEGACE ES) 625 MG/5ML suspension Take 5 mLs (625 mg total) by mouth daily. 150 mL 0  . oxyCODONE (OXY IR/ROXICODONE) 5 MG immediate release tablet Take 1 tablet (5 mg total) by mouth every 4 (four) hours as needed for severe pain. 30 tablet 0  . Telotristat Etiprate (XERMELO) 250 MG TABS Take 250 mg by mouth 3 (three)  times daily with meals. 90 tablet 2  . testosterone cypionate (DEPOTESTOSTERONE CYPIONATE) 200 MG/ML injection INJECT 2 ML INTO THE MUSCLE EVERY 2 WEEKS (Patient not taking: Reported on 01/18/2017) 10 mL 5  . Triamcinolone Acetonide (TRIAMCINOLONE 0.1 % CREAM : EUCERIN) CREA Apply 1 application topically 2 (two) times daily. (Patient not taking: Reported on 01/18/2017) 1 each 11  . umeclidinium-vilanterol (ANORO ELLIPTA) 62.5-25 MCG/INH AEPB Inhale 1 puff into the lungs daily. (Patient not taking: Reported on 01/18/2017) 21 each 0  . valACYclovir (VALTREX) 500 MG tablet TAKE 1 TABLET BY MOUTH TWICE DAILY AS NEEDED FOR FEVER BLISTER (Patient not taking: Reported on 01/18/2017) 90 tablet 1   Current Facility-Administered Medications  Medication Dose Route Frequency Provider Last Rate Last Dose  . ipratropium-albuterol (DUONEB) 0.5-2.5 (3) MG/3ML nebulizer solution 3 mL  3 mL Nebulization Q6H Forcucci, Courtney, PA-C       Facility-Administered Medications Ordered in Other Visits  Medication Dose Route Frequency Provider Last Rate Last Dose  . octreotide (SANDOSTATIN LAR) IM injection 30 mg  30 mg Intramuscular Once Truitt Merle, MD        REVIEW OF SYSTEMS:  Constitutional: Denies fevers, chills or abnormal night sweats (+) significant fatigue (+) Flushing (+) Poor appetite (+) Dizziness Eyes: Denies double vision or watery eyes (+) Blurry vision Ears, nose, mouth, throat, and face: Denies mucositis or sore throat Respiratory: Denies cough, dyspnea Cardiovascular: Denies palpitation, chest discomfort (+)  Lower extremity edema. Gastrointestinal:  Denies nausea, heartburn (+) Abdominal distention, (+) pain (+) Diarrhea Skin:  (+) dry, itchy bumps on skin over abdomen and back, (+) skin darkening is subsiding Lymphatics: Denies new lymphadenopathy or easy bruising Neurological:Denies numbness, tingling or new weaknesses Behavioral/Psych: Mood is stable, no new changes  All other systems were reviewed with the patient and are negative.  PHYSICAL EXAMINATION:  ECOG PERFORMANCE STATUS: 2 Vitals:   01/18/17 1455  BP: (!) 135/91  Pulse: 63  Resp: 18  Temp: 97.5 F (36.4 C)  TempSrc: Oral  SpO2: 100%  Weight: 153 lb 9.6 oz (69.7 kg)  Height: 5\' 7"  (1.702 m)   GENERAL:alert, no distress and comfortable SKIN:  (+) very dry skin and sporadic scaly rash with scratch markers over abdomen and back (+) flushing EYES: normal, conjunctiva are pink and non-injected, sclera clear OROPHARYNX:no exudate, no erythema and lips, buccal mucosa, and tongue normal  NECK: supple, thyroid normal size, non-tender, without nodularity LYMPH:  no palpable lymphadenopathy in the cervical, axillary or inguinal LUNGS: clear to auscultation and percussion with normal breathing effort HEART: regular rate & rhythm and no murmurs and no lower extremity edema ABDOMEN:abdomen soft and normal bowel sounds, no organomegaly. (+) pain in middle abdomen (+) Large amount ascites, skin rash improved since last exam Musculoskeletal:no cyanosis of digits and no clubbing (+) swelling of bilateral lower extremities, more so of the left leg (+) Presents in a wheelchair. PSYCH: alert & oriented x 3 with fluent speech NEURO: no focal motor/sensory deficits  LABORATORY DATA:  I have reviewed the data as listed CBC Latest Ref Rng & Units 01/18/2017 12/10/2016 11/13/2016  WBC 4.0 - 10.3 10e3/uL 3.0(L) 2.4(L) 4.9  Hemoglobin 13.0 - 17.1 g/dL 13.1 14.1 14.9  Hematocrit 38.4 - 49.9 % 40.3 43.6 45.5  Platelets 140 - 400 10e3/uL 182 168 266    CMP Latest Ref Rng & Units 01/18/2017 12/10/2016 11/13/2016  Glucose 70 - 140 mg/dl 61(L) 163(H) 109  BUN 7.0 - 26.0 mg/dL 36.3(H) 10.6 9.5  Creatinine 0.7 -  1.3 mg/dL 0.9 0.8 0.8  Sodium 136 - 145 mEq/L 143 142 142  Potassium 3.5 - 5.1 mEq/L 4.9 4.8 4.5  Chloride 101 - 111 mmol/L - - -  CO2 22 - 29 mEq/L 28 29 32(H)  Calcium 8.4 - 10.4 mg/dL 8.4 8.7 8.4  Total Protein 6.4 - 8.3 g/dL 6.3(L) 6.1(L) 6.2(L)  Total Bilirubin 0.20 - 1.20 mg/dL 0.46 0.41 0.56  Alkaline Phos 40 - 150 U/L 465(H) 358(H) 347(H)  AST 5 - 34 U/L 169(H) 38(H) 32  ALT 0 - 55 U/L 71(H) 20 21   PATHOLOGY REPORT  Diagnosis 06/08/2016 Liver, biopsy, Right hepatic lobe - METASTATIC LOW GRADE NEUROENDOCRINE TUMOR (CARCINOID TUMOR). - SEE COMMENT.  Diagnosis 06/11/2016 1. Ileum, biopsy, terminal - INFLAMMATORY PROLAPSE TYPE POLYP. - NO DYSPLASIA OR MALIGNANCY. 2. Colon, polyp(s), ascending - TUBULAR ADENOMA (X3 FRAGMENTS). - NO HIGH GRADE DYSPLASIA OR MALIGNANCY. 3. Stomach, biopsy, distal - CHRONIC ACTIVE GASTRITIS. - NEGATIVE FOR HELICOBACTER PYLORI. - NO INTESTINAL METAPLASIA, DYSPLASIA, OR MALIGNANCY. - NO EVIDENCE OF CARCINOID.  Diagnosis 11/24/16 PERITONEAL/ASCITIC FLUID (SPECIMEN 1 OF 1 COLLECTED 11/24/16): REACTIVE MESOTHELIAL CELLS PRESENT. MIXED LYMPHOID POPULATION.  Diagnosis 07/10/2016 Lung, needle/core biopsy(ies), Right Lower Lobe - NON SMALL CELL CARCINOMA.  PROCEDURES US Paracentesis 10/27/16 IMPRESSION: Successful ultrasound-guided diagnostic and therapeutic paracentesis yielding 4 liters of peritoneal fluid. Pathology 10/27/16 Diagnosis PERITONEAL/ASCITIC FLUID (SPECIMEN 1 OF 1 COLLECTED 10/27/16): REACTIVE MESOTHELIAL CELLS. NO MALIGNANT CELLS IDENTIFIED.  US Paracentesis 11/24/16 IMPRESSION: Successful ultrasound-guided diagnostic and therapeutic paracentesis yielding 5 liters of peritoneal fluid. Diagnosis  PERITONEAL/ASCITIC FLUID (SPECIMEN 1 OF 1 COLLECTED 11/24/16): REACTIVE  MESOTHELIAL CELLS PRESENT. MIXED LYMPHOID POPULATION.  US Paracentesis 12/18/16 IMPRESSION: Successful ultrasound-guided diagnostic/therapeutic paracentesis yielding 5 liters of peritoneal fluid.  RADIOGRAPHIC STUDIES: I have personally reviewed the radiological images as listed and agreed with the findings in the report. Nm Pet (netspot Ga 68 Dotatate) Skull Base To Mid Thigh  Result Date: 01/07/2017 CLINICAL DATA:  Metastatic neuroendocrine tumor. Subsequent treatment evaluation. EXAM: NUCLEAR MEDICINE PET SKULL BASE TO THIGH TECHNIQUE: 4.1 mCi Ga 68 DOTATATE was injected intravenously. Full-ring PET imaging was performed from the skull base to thigh after the radiotracer. CT data was obtained and used for attenuation correction and anatomic localization. COMPARISON:  DOTATATE PET scan 08/20/2016 FINDINGS: NECK No radiotracer activity in neck lymph nodes. CHEST No radiotracer accumulation within mediastinal or hilar lymph nodes. No suspicious pulmonary nodules on the CT scan. ABDOMEN/PELVIS Again demonstrated multiple lesions within the liver which have intense radiotracer activity. Dominant lesion the RIGHT hepatic lobe with SUV max equal 64 compares to SUV max 56. Lesion is similar size. Approximately 12 additional lesions within liver. No new lesions identified. Example lesion in the anterior LEFT hepatic lobe with SUV max equal 33 compared with SUV max equal 30. Again demonstratesd several radiotracer avid lymph nodes in the porta hepatis. Moderate metabolic activity within the pancreas may be physiologic. Focal activity the terminal ileum with SUV max equal 33 compares with SUV max equal 24. There is new large volume ascites within the abdomen and pelvis. No clear evidence of peritoneal metastatic disease. Physiologic activity noted in the liver, spleen, adrenal glands and kidneys. SKELETON Multiple small foci of discrete radiotracer activity within the skeleton with minimal increase in radiotracer  activity compared to prior. For example lesion in the manubrium with SUV max equal 18 compared to SUV max equal 15. IMPRESSION: 1. Mild increase in radiotracer activity within hepatic lesions, upper abdominal lymph nodes, terminal ileum lesion, and  skeletal metastasis. 2. No new lesions are identified. 3. New large volume ascites. No evidence of peritoneal carcinomatosis. Electronically Signed   By: Suzy Bouchard M.D.   On: 01/07/2017 09:54   EGD 06/11/2016 - Normal esophagus. - Normal examined duodenum. - Abnromal distal stomach, this was somewhat neoplastic appearing, biospied extensively. - The examination was otherwise normal.  Colonoscopy 06/11/2016: One abnormal appearing ileal polypoid leion in the terminal ileum. Biopsied. - Two 8 to 11 mm polyps in the transverse colon and in the ascending colon, removed with a hot snare. Resected and retrieved. - One 5 mm polyp in the transverse colon, removed with a cold snare. Resected and retrieved. - Diverticulosis in the left colon.  ECHO 06/05/2016 - Left ventricle: The cavity size was normal. Left ventricular   diastolic function parameters were normal. - Ventricular septum: The contour showed diastolic flattening.   These changes are consistent with RV volume overload. - Right ventricle: The cavity size was severely dilated. Systolic   function was mildly reduced. - Right atrium: The atrium was severely dilated. - Tricuspid valve: Mobility was restricted. There was malcoaptation   of the valve leaflets. There was wide-open regurgitation. 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  ASSESSMENT & PLAN:  61 y.o. male with past medical history of hypertension, hepatitis B infection, presented with worsening diarrhea for one and half years, fatigue, anorexia and weight loss.  1. 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 I spoke with radiologist Dr. Leonia Reeves and we think his primary carcinoid tumor is probably in the terminal ileum. The PET scan also reviewed multiple bone metastasis, he is not symptomatic from the bone mets.  -I previously discussed his liver biopsy results, which showed metastatic carcinoid tumor, low-grade -His EGD showed abnormal appearing of the distal stomach, however the biopsy only showed inflammatory change. Colonoscopy was also an negative except polyps, and biopsy was negative for carcinoid tumor. --We previously discussed the nature history of metastatic carcinoid tumor. Due to his diffuse liver and bone metastasis, I do not think he is a candidate for upfront metastasectomy, and his disease is likely incurable. We discussed this is a indolent disease, and most people do well with treatment -the goal of therapy is palliative, to prolong his life and improve his quality of life  -He has previously started Sandostatin injection, tolerated well, but he is more symptomatic lately, with new ascites and fatigue  -his diarrhea has improved with Xermelo 250mg  2-3 times daily. I encourage him to take 3 times a day, will continue -Repeat CT abdomen and pelvis on 10/23/16 to evaluate his disease status. This showed no significant interval change in diffuse hypervascular liver metastases and stable subtle small lytic bone metastases within the pelvis. However, there was increased large amount of ascites and diffuse body wall edema and new left abdominal omental soft tissue thickening, suspicious for peritoneal carcinomatosis. -He has started second line therapy with Everolimus, tolerates OK with fatigue and anorexia, he has noticed his skin changes (rashes improved,  hyperpigmentation of face has improved) -I reviewed his restaging PET scan from Jan 07, 2017, which showed slightly disease progression in liver, lymph nodes and bone no other  new lesions. No PET evidence of peritoneal carcinomatosis - Continue Afinitor for now. -Our LU177-DOTATATE (Lutathera) is almost ready to start and his insurance has approval for the treatment. I will try to get his scheduled for the treatment asap  -Giving his overall deterioration, poor performance status, palliative care, and bridging to hospice is also reasonable. I discussed this with patient and his wife, he is not ready for it.  2. Ascites and abodominal Pain -Caused by the patient's recurrent ascites. -We discussed that if he develops additional ascites, then placement of a PleurX catheter is advised. -I prescribed Oxy IR 5 mg every 4 hours PRN on 01/18/17.  3. Carcinoid syndrome -He previously had chronic diarrhea, vasodilation and discoloration on his face, and tricuspid valve abnormality and a severe right heart dilatation secondary to his carcinoid syndrome -He will continue to follow with cardiology -Sandostatin injection has been held since 11/13/16 for his PET -DOTATATE on 01/07/17. -I previously started him on Xermelo for his diarrhea which is working well, will continue   4. Skin rashes -he previously had diffuse skin rashes on his trunk, not sure if it's related to his carcinoid syndrome. It's very itchy, and the bothersome. He has tried over-the-counter and prescription topical steroids, which does not help. -This is improved since he started Afinitor. His skin rash is probably related to carcinoid syndrome.  5. Adenocarcinoma of right lung, cT1N0M0, stage I  -His right lower lobe nodule is intensely hypermetabolic on PET scan, no significant adenopathy or other hypermetabolic lesion suggesting metastasis. Biopsy confirmed non-small cell lung cancer, consistent with adenocarcinoma. -He is s/p SBRT to his  lung cancer, will follow up with Dr. Tammi Klippel  6. Bone Health -due to his diffuse bone metastasis, I recommended him to start monthly Xgeva injection on 10/16/16. Potential side effects, especially jaw necrosis, were discussed with patient. I previously encouraged him to take calcium and vitamin D. -He is tolerating well, we'll continue  7. Anorexia and weight loss -Secondary of his metastatic disease and ascites. -I have prescribed Megace on 01/18/17 to stimulate his appetite.   8. Goal of care discussion  -We again discussed the incurable nature of his metastatic carcinoid tumor, it's very treatable and probably slow growing, but we are not able to cure it. Most patients can live for several years.  -The patient understands the goal of care is palliative. -We discussed Hospice referral and the patient and his wife declined.  -I recommend DO NOT RESUSCITATE and DO NOT INTUBATE, he will think about it.  Plan - Labs reviewed today. - Proceed with Xgeva and Sandostatin injection tomorrow. - Continue Afinitor. - Paracentesis scheduled on 01/19/17. - Likely ready to begin 177-Dotatate therapy soon, I will send a message to Dr. Leonia Reeves  - Lab and follow up in 2 weeks for symptom management    All questions were answered. The patient knows to call the clinic with any problems, questions or concerns.  I spent 25 minutes counseling the patient face to face. The total time spent in the appointment was 30 minutes and more than 50% was on counseling.    Truitt Merle, MD 01/18/2017   This document serves as a record of services personally performed by Truitt Merle, MD. It was created on her behalf by Darcus Austin, a trained medical scribe. The creation of this record is based on the scribe's personal observations and the provider's statements to them. This document has been checked and approved by the attending provider.

## 2017-01-19 ENCOUNTER — Ambulatory Visit: Payer: Self-pay | Admitting: Internal Medicine

## 2017-01-19 ENCOUNTER — Ambulatory Visit (HOSPITAL_COMMUNITY)
Admission: RE | Admit: 2017-01-19 | Discharge: 2017-01-19 | Disposition: A | Discharge status: Home / Self Care | Payer: Commercial Managed Care - PPO | Source: Ambulatory Visit | Attending: Hematology | Admitting: Hematology

## 2017-01-19 ENCOUNTER — Ambulatory Visit (HOSPITAL_BASED_OUTPATIENT_CLINIC_OR_DEPARTMENT_OTHER): Payer: Commercial Managed Care - PPO

## 2017-01-19 VITALS — BP 100/69 | HR 68 | Temp 97.5°F | Resp 20

## 2017-01-19 DIAGNOSIS — C7B02 Secondary carcinoid tumors of liver: Secondary | ICD-10-CM

## 2017-01-19 DIAGNOSIS — E340 Carcinoid syndrome: Secondary | ICD-10-CM | POA: Diagnosis not present

## 2017-01-19 DIAGNOSIS — C7B03 Secondary carcinoid tumors of bone: Secondary | ICD-10-CM | POA: Diagnosis not present

## 2017-01-19 DIAGNOSIS — R188 Other ascites: Secondary | ICD-10-CM | POA: Insufficient documentation

## 2017-01-19 MED ORDER — OCTREOTIDE ACETATE 30 MG IM KIT
30.0000 mg | PACK | Freq: Once | INTRAMUSCULAR | Status: AC
  Administered 2017-01-19: 30 mg via INTRAMUSCULAR
  Filled 2017-01-19: qty 1

## 2017-01-19 MED ORDER — DENOSUMAB 120 MG/1.7ML ~~LOC~~ SOLN
120.0000 mg | Freq: Once | SUBCUTANEOUS | Status: AC
  Administered 2017-01-19: 120 mg via SUBCUTANEOUS
  Filled 2017-01-19: qty 1.7

## 2017-01-19 NOTE — Patient Instructions (Signed)
Octreotide injection solution What is this medicine? OCTREOTIDE (ok TREE oh tide) is used to reduce blood levels of growth hormone in patients with a condition called acromegaly. This medicine also reduces flushing and watery diarrhea caused by certain types of cancer. This medicine may be used for other purposes; ask your health care provider or pharmacist if you have questions. COMMON BRAND NAME(S): Sandostatin, Sandostatin LAR What should I tell my health care provider before I take this medicine? They need to know if you have any of these conditions: -gallbladder disease -kidney disease -liver disease -an unusual or allergic reaction to octreotide, other medicines, foods, dyes, or preservatives -pregnant or trying to get pregnant -breast-feeding How should I use this medicine? This medicine is for injection under the skin or into a vein (only in emergency situations). It is usually given by a health care professional in a hospital or clinic setting. If you get this medicine at home, you will be taught how to prepare and give this medicine. Allow the injection solution to come to room temperature before use. Do not warm it artificially. Use exactly as directed. Take your medicine at regular intervals. Do not take your medicine more often than directed. It is important that you put your used needles and syringes in a special sharps container. Do not put them in a trash can. If you do not have a sharps container, call your pharmacist or healthcare provider to get one. Talk to your pediatrician regarding the use of this medicine in children. Special care may be needed. Overdosage: If you think you have taken too much of this medicine contact a poison control center or emergency room at once. NOTE: This medicine is only for you. Do not share this medicine with others. What if I miss a dose? If you miss a dose, take it as soon as you can. If it is almost time for your next dose, take only that  dose. Do not take double or extra doses. What may interact with this medicine? Do not take this medicine with any of the following medications: -cisapride -droperidol -general anesthetics -grepafloxacin -perphenazine -thioridazine This medicine may also interact with the following medications: -bromocriptine -cyclosporine -diuretics -medicines for blood pressure, heart disease, irregular heart beat -medicines for diabetes, including insulin -quinidine This list may not describe all possible interactions. Give your health care provider a list of all the medicines, herbs, non-prescription drugs, or dietary supplements you use. Also tell them if you smoke, drink alcohol, or use illegal drugs. Some items may interact with your medicine. What should I watch for while using this medicine? Visit your doctor or health care professional for regular checks on your progress. To help reduce irritation at the injection site, use a different site for each injection and make sure the solution is at room temperature before use. This medicine may cause increases or decreases in blood sugar. Signs of high blood sugar include frequent urination, unusual thirst, flushed or dry skin, difficulty breathing, drowsiness, stomach ache, nausea, vomiting or dry mouth. Signs of low blood sugar include chills, cool, pale skin or cold sweats, drowsiness, extreme hunger, fast heartbeat, headache, nausea, nervousness or anxiety, shakiness, trembling, unsteadiness, tiredness, or weakness. Contact your doctor or health care professional right away if you experience any of these symptoms. What side effects may I notice from receiving this medicine? Side effects that you should report to your doctor or health care professional as soon as possible: -allergic reactions like skin rash, itching or hives, swelling   of the face, lips, or tongue -changes in blood sugar -changes in heart rate -severe stomach pain Side effects that  usually do not require medical attention (report to your doctor or health care professional if they continue or are bothersome): -diarrhea or constipation -gas or stomach pain -nausea, vomiting -pain, redness, swelling and irritation at site where injected This list may not describe all possible side effects. Call your doctor for medical advice about side effects. You may report side effects to FDA at 1-800-FDA-1088. Where should I keep my medicine? Keep out of the reach of children. Store in a refrigerator between 2 and 8 degrees C (36 and 46 degrees F). Protect from light. Allow to come to room temperature naturally. Do not use artificial heat. If protected from light, the injection may be stored at room temperature between 20 and 30 degrees C (70 and 86 degrees F) for 14 days. After the initial use, throw away any unused portion of a multiple dose vial after 14 days. Throw away unused portions of the ampules after use. NOTE: This sheet is a summary. It may not cover all possible information. If you have questions about this medicine, talk to your doctor, pharmacist, or health care provider.  2018 Elsevier/Gold Standard (2008-02-21 16:56:04)  Denosumab injection What is this medicine? DENOSUMAB (den oh sue mab) slows bone breakdown. Prolia is used to treat osteoporosis in women after menopause and in men. Xgeva is used to treat a high calcium level due to cancer and to prevent bone fractures and other bone problems caused by multiple myeloma or cancer bone metastases. Xgeva is also used to treat giant cell tumor of the bone. This medicine may be used for other purposes; ask your health care provider or pharmacist if you have questions. COMMON BRAND NAME(S): Prolia, XGEVA What should I tell my health care provider before I take this medicine? They need to know if you have any of these conditions: -dental disease -having surgery or tooth extraction -infection -kidney disease -low levels of  calcium or Vitamin D in the blood -malnutrition -on hemodialysis -skin conditions or sensitivity -thyroid or parathyroid disease -an unusual reaction to denosumab, other medicines, foods, dyes, or preservatives -pregnant or trying to get pregnant -breast-feeding How should I use this medicine? This medicine is for injection under the skin. It is given by a health care professional in a hospital or clinic setting. If you are getting Prolia, a special MedGuide will be given to you by the pharmacist with each prescription and refill. Be sure to read this information carefully each time. For Prolia, talk to your pediatrician regarding the use of this medicine in children. Special care may be needed. For Xgeva, talk to your pediatrician regarding the use of this medicine in children. While this drug may be prescribed for children as young as 13 years for selected conditions, precautions do apply. Overdosage: If you think you have taken too much of this medicine contact a poison control center or emergency room at once. NOTE: This medicine is only for you. Do not share this medicine with others. What if I miss a dose? It is important not to miss your dose. Call your doctor or health care professional if you are unable to keep an appointment. What may interact with this medicine? Do not take this medicine with any of the following medications: -other medicines containing denosumab This medicine may also interact with the following medications: -medicines that lower your chance of fighting infection -steroid medicines like prednisone   or cortisone This list may not describe all possible interactions. Give your health care provider a list of all the medicines, herbs, non-prescription drugs, or dietary supplements you use. Also tell them if you smoke, drink alcohol, or use illegal drugs. Some items may interact with your medicine. What should I watch for while using this medicine? Visit your doctor or  health care professional for regular checks on your progress. Your doctor or health care professional may order blood tests and other tests to see how you are doing. Call your doctor or health care professional for advice if you get a fever, chills or sore throat, or other symptoms of a cold or flu. Do not treat yourself. This drug may decrease your body's ability to fight infection. Try to avoid being around people who are sick. You should make sure you get enough calcium and vitamin D while you are taking this medicine, unless your doctor tells you not to. Discuss the foods you eat and the vitamins you take with your health care professional. See your dentist regularly. Brush and floss your teeth as directed. Before you have any dental work done, tell your dentist you are receiving this medicine. Do not become pregnant while taking this medicine or for 5 months after stopping it. Talk with your doctor or health care professional about your birth control options while taking this medicine. Women should inform their doctor if they wish to become pregnant or think they might be pregnant. There is a potential for serious side effects to an unborn child. Talk to your health care professional or pharmacist for more information. What side effects may I notice from receiving this medicine? Side effects that you should report to your doctor or health care professional as soon as possible: -allergic reactions like skin rash, itching or hives, swelling of the face, lips, or tongue -bone pain -breathing problems -dizziness -jaw pain, especially after dental work -redness, blistering, peeling of the skin -signs and symptoms of infection like fever or chills; cough; sore throat; pain or trouble passing urine -signs of low calcium like fast heartbeat, muscle cramps or muscle pain; pain, tingling, numbness in the hands or feet; seizures -unusual bleeding or bruising -unusually weak or tired Side effects that  usually do not require medical attention (report to your doctor or health care professional if they continue or are bothersome): -constipation -diarrhea -headache -joint pain -loss of appetite -muscle pain -runny nose -tiredness -upset stomach This list may not describe all possible side effects. Call your doctor for medical advice about side effects. You may report side effects to FDA at 1-800-FDA-1088. Where should I keep my medicine? This medicine is only given in a clinic, doctor's office, or other health care setting and will not be stored at home. NOTE: This sheet is a summary. It may not cover all possible information. If you have questions about this medicine, talk to your doctor, pharmacist, or health care provider.  2018 Elsevier/Gold Standard (2016-08-18 19:17:21)   

## 2017-01-19 NOTE — Procedures (Signed)
Ultrasound-guided therapeutic paracentesis performed yielding 7.2 liters of yellow fluid. No immediate complications.

## 2017-01-22 LAB — CHROMOGRANIN A: CHROMOGRAN A: 775 nmol/L — AB (ref 0–5)

## 2017-01-28 ENCOUNTER — Other Ambulatory Visit: Payer: Self-pay | Admitting: Hematology

## 2017-01-28 DIAGNOSIS — C7B02 Secondary carcinoid tumors of liver: Secondary | ICD-10-CM

## 2017-01-29 ENCOUNTER — Telehealth: Payer: Self-pay | Admitting: *Deleted

## 2017-01-29 NOTE — Telephone Encounter (Signed)
Received call from Melinda/[facility]/Case Manager wanting to make sure that we know she can be called for needs, concerns.  She requested last OV note to be faxed.  This was done.

## 2017-02-01 ENCOUNTER — Other Ambulatory Visit (HOSPITAL_BASED_OUTPATIENT_CLINIC_OR_DEPARTMENT_OTHER): Payer: Commercial Managed Care - PPO

## 2017-02-01 ENCOUNTER — Telehealth: Payer: Self-pay | Admitting: Nurse Practitioner

## 2017-02-01 ENCOUNTER — Ambulatory Visit (HOSPITAL_BASED_OUTPATIENT_CLINIC_OR_DEPARTMENT_OTHER): Payer: Commercial Managed Care - PPO | Admitting: Nurse Practitioner

## 2017-02-01 ENCOUNTER — Other Ambulatory Visit: Payer: Self-pay | Admitting: Internal Medicine

## 2017-02-01 VITALS — BP 107/79 | HR 75 | Resp 18 | Ht 67.0 in | Wt 153.9 lb

## 2017-02-01 DIAGNOSIS — C7B02 Secondary carcinoid tumors of liver: Secondary | ICD-10-CM | POA: Diagnosis not present

## 2017-02-01 DIAGNOSIS — E340 Carcinoid syndrome: Secondary | ICD-10-CM

## 2017-02-01 DIAGNOSIS — R188 Other ascites: Secondary | ICD-10-CM

## 2017-02-01 DIAGNOSIS — R21 Rash and other nonspecific skin eruption: Secondary | ICD-10-CM

## 2017-02-01 DIAGNOSIS — C3431 Malignant neoplasm of lower lobe, right bronchus or lung: Secondary | ICD-10-CM

## 2017-02-01 DIAGNOSIS — C7A00 Malignant carcinoid tumor of unspecified site: Secondary | ICD-10-CM | POA: Diagnosis not present

## 2017-02-01 DIAGNOSIS — C7B03 Secondary carcinoid tumors of bone: Secondary | ICD-10-CM

## 2017-02-01 LAB — PROTIME-INR
INR: 1.2 — ABNORMAL LOW (ref 2.00–3.50)
Protime: 14.4 Seconds — ABNORMAL HIGH (ref 10.6–13.4)

## 2017-02-01 NOTE — Progress Notes (Signed)
  [facility] OFFICE PROGRESS NOTE   Diagnosis:  Metastatic carcinoid tumor, stage I lung adenocarcinoma  INTERVAL HISTORY:   Ruben Mccormick returns as scheduled. He discontinued Afinitor about 2 week schedule to see if this would help with his appetite. He noted no significant improvement and plans to resume. He continues Sandostatin every 4 weeks, last injection 01/19/2017. He had a paracentesis on 01/19/2017 with 7.2 L of fluid removed. He felt much better following the paracentesis. He is able to eat more due to improvement in early satiety. He would like another paracentesis later this week. He has not started Megace. He checked with his pharmacy and was told they had not received the prescription. He denies nausea and no significant diarrhea. Stools are described as "soft". Energy level continues to be poor.  Objective:  Vital signs in last 24 hours:  Blood pressure 107/79, pulse 75, resp. rate 18, height 5\' 7"  (1.702 m), weight 153 lb 14.4 oz (69.8 kg).    HEENT: No thrush or ulcers. Resp: Lungs clear bilaterally. Cardio: Regular rate and rhythm. GI: Abdomen is distended consistent with ascites. Vascular: Trace edema at the lower legs bilaterally. Neuro: Alert and oriented.  Skin: Skin discoloration over the face and chest.    Lab Results:  Lab Results  Component Value Date   WBC 3.0 (L) 01/18/2017   HGB 13.1 01/18/2017   HCT 40.3 01/18/2017   MCV 94.4 01/18/2017   PLT 182 01/18/2017   NEUTROABS 2.3 01/18/2017    Imaging:  No results found.  Medications: I have reviewed the patient's current medications.  Assessment/Plan: 1. 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 2. Ascites, recurrent; paracentesis prn. 3. 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 Followed by cardiology. On Sandostatin. Xermelo for diarrhea.  4. Skin rash, improved since beginning Afinitor. 5. Adenocarcinoma of right lung, cT1N0M0, stage I; s/p SBRT  6. Bone health; Monthly Xgeva 7. Anorexia and weight loss. Megace prescribed 01/18/2017.    Disposition: Mr. Ruben Mccormick has metastatic carcinoid tumor. He discontinued Afinitor recently but plans to resume. He has been referred for Lutathera. We will contact nuclear medicine for an update. He would like to have a paracentesis on Friday. We have made arrangements for this.   I recommended a follow-up appointment in 2 weeks. He needs appointments on Fridays due to transportation. He will return to see Dr. Burr Medico on 02/19/2017. He will contact the office in the interim with any problems.     Ned Card ANP/GNP-BC   02/01/2017  3:18 PM

## 2017-02-01 NOTE — Telephone Encounter (Signed)
Scheduled appt per 6/25 los - Gave patient AVS and calender per los.  

## 2017-02-05 ENCOUNTER — Encounter (HOSPITAL_COMMUNITY): Payer: Self-pay | Admitting: Student

## 2017-02-05 ENCOUNTER — Other Ambulatory Visit: Payer: Self-pay | Admitting: Hematology

## 2017-02-05 ENCOUNTER — Ambulatory Visit (HOSPITAL_COMMUNITY)
Admission: RE | Admit: 2017-02-05 | Discharge: 2017-02-05 | Disposition: A | Discharge status: Home / Self Care | Payer: Commercial Managed Care - PPO | Source: Ambulatory Visit | Attending: Nurse Practitioner | Admitting: Nurse Practitioner

## 2017-02-05 ENCOUNTER — Other Ambulatory Visit: Payer: Self-pay | Admitting: Nurse Practitioner

## 2017-02-05 ENCOUNTER — Ambulatory Visit (HOSPITAL_COMMUNITY): Payer: Commercial Managed Care - PPO

## 2017-02-05 DIAGNOSIS — R188 Other ascites: Secondary | ICD-10-CM | POA: Diagnosis not present

## 2017-02-05 DIAGNOSIS — C7B02 Secondary carcinoid tumors of liver: Secondary | ICD-10-CM

## 2017-02-05 HISTORY — PX: IR PARACENTESIS: IMG2679

## 2017-02-05 MED ORDER — LIDOCAINE HCL 1 % IJ SOLN
INTRAMUSCULAR | Status: DC | PRN
  Administered 2017-02-05: 10 mL

## 2017-02-05 MED ORDER — LIDOCAINE HCL (PF) 1 % IJ SOLN
INTRAMUSCULAR | Status: AC
  Filled 2017-02-05: qty 30

## 2017-02-05 NOTE — Procedures (Signed)
PROCEDURE SUMMARY:  Successful US guided paracentesis from left lateral abdomen.  Yielded 6.0 liters of yellow, hazy fluid.  No immediate complications.  Pt tolerated well.   Specimen was not sent for labs.  Docia Barrier PA-C 02/05/2017 2:24 PM

## 2017-02-14 LAB — 5 HIAA, QUANTITATIVE, URINE, 24 HOUR
5-HIAA, Urine: 444 mg/L
5-HIAA,Quant.,24 Hr Urine: 111 mg/24 hr — ABNORMAL HIGH (ref 0.0–14.9)

## 2017-02-15 ENCOUNTER — Telehealth: Payer: Self-pay | Admitting: *Deleted

## 2017-02-15 ENCOUNTER — Other Ambulatory Visit (HOSPITAL_COMMUNITY): Payer: Self-pay | Admitting: Diagnostic Radiology

## 2017-02-15 ENCOUNTER — Encounter: Payer: Self-pay | Admitting: Hematology

## 2017-02-15 DIAGNOSIS — D3A8 Other benign neuroendocrine tumors: Secondary | ICD-10-CM

## 2017-02-15 NOTE — Telephone Encounter (Signed)
Letter faxed to [employer] per wifes request.

## 2017-02-15 NOTE — Telephone Encounter (Signed)
Received call from pt's wife stating that she needs a letter to relieve pt from jury duty.  She states that she called & was told that they just need a Dr's note faxed to [facility] @ [phone] with file # [phone] [id number] She also states that pt is upset that his shot has not been scheduled & she doesn't think it will be helpful from what she has read.  He is not able to give in to hospice yet & she would like Dr Burr Medico to be open/blunt with him at visit this fri.  She felt like Dr Burr Medico wanted pt to get Hospice.  Message to Dr Burr Medico.

## 2017-02-16 ENCOUNTER — Encounter (HOSPITAL_COMMUNITY): Payer: Self-pay | Admitting: Radiology

## 2017-02-16 ENCOUNTER — Other Ambulatory Visit (HOSPITAL_BASED_OUTPATIENT_CLINIC_OR_DEPARTMENT_OTHER): Payer: Commercial Managed Care - PPO

## 2017-02-16 ENCOUNTER — Ambulatory Visit (HOSPITAL_COMMUNITY)
Admission: RE | Admit: 2017-02-16 | Discharge: 2017-02-16 | Disposition: A | Discharge status: Home / Self Care | Payer: Commercial Managed Care - PPO | Source: Ambulatory Visit | Attending: Diagnostic Radiology | Admitting: Diagnostic Radiology

## 2017-02-16 DIAGNOSIS — C7B02 Secondary carcinoid tumors of liver: Secondary | ICD-10-CM | POA: Diagnosis not present

## 2017-02-16 DIAGNOSIS — C7B03 Secondary carcinoid tumors of bone: Secondary | ICD-10-CM

## 2017-02-16 DIAGNOSIS — D3A8 Other benign neuroendocrine tumors: Secondary | ICD-10-CM

## 2017-02-16 HISTORY — PX: IR RADIOLOGIST EVAL & MGMT: IMG5224

## 2017-02-16 LAB — CBC WITH DIFFERENTIAL/PLATELET
BASO%: 0.2 % (ref 0.0–2.0)
BASOS ABS: 0 10*3/uL (ref 0.0–0.1)
EOS%: 0.9 % (ref 0.0–7.0)
Eosinophils Absolute: 0 10*3/uL (ref 0.0–0.5)
HEMATOCRIT: 38.8 % (ref 38.4–49.9)
HGB: 12.5 g/dL — ABNORMAL LOW (ref 13.0–17.1)
LYMPH%: 13.1 % — AB (ref 14.0–49.0)
MCH: 30.7 pg (ref 27.2–33.4)
MCHC: 32.2 g/dL (ref 32.0–36.0)
MCV: 95.3 fL (ref 79.3–98.0)
MONO#: 0.4 10*3/uL (ref 0.1–0.9)
MONO%: 7.9 % (ref 0.0–14.0)
NEUT#: 3.6 10*3/uL (ref 1.5–6.5)
NEUT%: 77.9 % — AB (ref 39.0–75.0)
Platelets: 257 10*3/uL (ref 140–400)
RBC: 4.07 10*6/uL — ABNORMAL LOW (ref 4.20–5.82)
RDW: 17.1 % — ABNORMAL HIGH (ref 11.0–14.6)
WBC: 4.7 10*3/uL (ref 4.0–10.3)
lymph#: 0.6 10*3/uL — ABNORMAL LOW (ref 0.9–3.3)

## 2017-02-16 LAB — PROTIME-INR
INR: 1.1 — ABNORMAL LOW (ref 2.00–3.50)
PROTIME: 13.2 s (ref 10.6–13.4)

## 2017-02-16 NOTE — Consult Note (Signed)
Chief Complaint: Patient was seen in consultation today for evaluation for PRRT therapy Ruben Mccormick)  for metastatic carcinoid tumor at the request of Truitt Merle, MD  Referring Physician(s): Burr Medico, MD    Patient Status: [place]  History of Present Illness: Ruben Mccormick is a 61 y.o. male with history of metastatic neuroendocrine tumor. Patient presents for evaluation for peptide receptor radiotherapy (inferior). Patient has progressive disease on monthly Sandostatin injections. Patient recently started on R. Physical status is declining.  Past Medical History:  Diagnosis Date  . History of kidney stones   . Hyperlipidemia   . Hypertension   . Hypogonadism male   . Lung cancer ([place])   . Pre-diabetes   . Vitamin D deficiency     Past Surgical History:  Procedure Laterality Date  . APPENDECTOMY    . COLONOSCOPY WITH PROPOFOL N/A 06/11/2016   Procedure: COLONOSCOPY WITH PROPOFOL;  Surgeon: Milus Banister, MD;  Location: [facility];  Service: Endoscopy;  Laterality: N/A;  . ESOPHAGOGASTRODUODENOSCOPY (EGD) WITH PROPOFOL N/A 06/11/2016   Procedure: ESOPHAGOGASTRODUODENOSCOPY (EGD) WITH PROPOFOL;  Surgeon: Milus Banister, MD;  Location: [facility];  Service: Endoscopy;  Laterality: N/A;  . IR PARACENTESIS  02/05/2017  . VIDEO BRONCHOSCOPY WITH ENDOBRONCHIAL NAVIGATION Right 06/19/2016   Procedure: VIDEO BRONCHOSCOPY WITH ENDOBRONCHIAL NAVIGATION with RLL brushings, washings and bx;  Surgeon: [name], MD;  Location: [place];  Service: Thoracic;  Laterality: Right;    Allergies: Bee venom; Ppd [tuberculin purified protein derivative]; and Voltaren [diclofenac sodium]  Medications: Prior to Admission medications   Medication Sig Start Date End Date Taking? Authorizing Provider  aspirin 325 MG tablet Take 325 mg by mouth daily.    [provider]  Aspirin-Acetaminophen-Caffeine (GOODY HEADACHE PO) Take 1 packet by mouth daily as needed (headaches).    [provider]  B-D 3CC LUER-LOK SYR 21GX1" 21G X 1" 3 ML MISC USE AS DIRECTED FOR TESTOSTERONE 06/04/14   Vicie Mutters, PA-C  BREO ELLIPTA 100-25 MCG/INH AEPB TAKE 1 INHALATION BY MOUTH DAILY 02/01/17   Unk Pinto, MD  Cholecalciferol (VITAMIN D-3) 5000 UNITS TABS Take 5,000 Units by mouth daily.     [provider]  everolimus (AFINITOR) 10 MG tablet Take 1 tablet (10 mg total) by mouth daily. Patient taking differently: Take 10 mg by mouth daily. Started 11/14/16 11/16/16   Truitt Merle, MD  fluticasone (FLONASE) 50 MCG/ACT nasal spray INSTILL 2 SPRAYS INTO THE NOSTRILS AT NIGHT AS NEEDED FOR CONGESTION 12/19/15   [provider]  furosemide (LASIX) 40 MG tablet Take 1 tablet (40 mg total) by mouth daily. Patient taking differently: Take 40 mg by mouth daily as needed.  10/05/16 10/05/17  Forcucci, Courtney, PA-C  megestrol (MEGACE ES) 625 MG/5ML suspension Take 5 mLs (625 mg total) by mouth daily. Patient not taking: Reported on 02/01/2017 01/18/17   Truitt Merle, MD  Multiple Vitamin (MULTIVITAMIN WITH MINERALS) TABS tablet Take 1 tablet by mouth daily.    [provider]  oxyCODONE (OXY IR/ROXICODONE) 5 MG immediate release tablet Take 1 tablet (5 mg total) by mouth every 4 (four) hours as needed for severe pain. 01/18/17   Truitt Merle, MD  PREVIDENT 5000 BOOSTER PLUS 1.1 % PSTE Take 1 application by mouth daily.  11/11/15   [provider]  Telotristat Etiprate (XERMELO) 250 MG TABS Take 250 mg by mouth 3 (three) times daily with meals. 08/21/16   Truitt Merle, MD  testosterone cypionate (DEPOTESTOSTERONE CYPIONATE) 200  MG/ML injection INJECT 2 ML INTO THE MUSCLE EVERY 2 WEEKS Patient not taking: Reported on 01/18/2017 09/08/15   Unk Pinto, MD  Triamcinolone Acetonide (TRIAMCINOLONE 0.1 % CREAM : EUCERIN) CREA Apply 1 application topically 2 (two) times daily. Patient not taking: Reported on 01/18/2017 07/16/14   Unk Pinto, MD  umeclidinium-vilanterol [facility]  ELLIPTA) 62.5-25 MCG/INH AEPB Inhale 1 puff into the lungs daily. Patient not taking: Reported on 01/18/2017 10/05/16   Starlyn Skeans, PA-C  valACYclovir (VALTREX) 500 MG tablet TAKE 1 TABLET BY MOUTH TWICE DAILY AS NEEDED FOR FEVER BLISTER Patient not taking: Reported on 01/18/2017 06/21/15   Unk Pinto, MD     Family History  Problem Relation Age of Onset  . Diabetes Father   . Cancer Father        mesothelioma   . Alzheimer's disease Mother   . Parkinson's disease Mother   . Cancer Paternal Grandfather        colon    Social History   Social History  . Marital status: Married    Spouse name: N/A  . Number of children: N/A  . Years of education: N/A   Social History Main Topics  . Smoking status: Former Smoker    Packs/day: 1.00    Years: 40.00    Types: Cigarettes    Quit date: 09/11/2015  . Smokeless tobacco: Never Used  . Alcohol use 16.8 oz/week    14 Shots of liquor, 14 Standard drinks or equivalent per week     Comment: 2 drinks daily since young   . Drug use: No     Comment: cocaine in the past, no iv drugs   . Sexual activity: Not on file   Other Topics Concern  . Not on file   Social History Narrative  . No narrative on file    ECOG Status: 3 - Symptomatic, >50% confined to bed   Karnofsky scale:40 to  50  Artemis G scale evaluation 40)   Review of Systems: A 12 point ROS discussed and pertinent positives are indicated in the HPI above.  All other systems are negative.  Review of Systems  Vital Signs: There were no vitals taken for this visit.  Physical Exam  Patient is thin and confined to wheelchair. Patient has also muscle mass. Abdominal distention consistent with ascites.      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 Request made for therapeutic paracentesis. EXAM: ULTRASOUND GUIDED THERAPEUTIC  PARACENTESIS MEDICATIONS: None. COMPLICATIONS: None immediate. PROCEDURE: Informed written consent was obtained from the patient after a discussion of the risks, benefits and alternatives to treatment. A timeout was performed prior to the initiation of the procedure. Initial ultrasound scanning demonstrates a large amount of ascites within the right lower abdominal quadrant. The right lower abdomen was prepped and draped in the usual sterile fashion. 1% lidocaine was used for local anesthesia. Following this, a Yueh catheter was introduced. An ultrasound image was saved for documentation purposes. The paracentesis was performed. The catheter was removed and a dressing was applied. The patient tolerated the procedure well without immediate post procedural complication. FINDINGS: A total of approximately 7.2 liters of yellow fluid was removed. IMPRESSION: Successful ultrasound-guided therapeutic paracentesis yielding 7.2 liters of peritoneal fluid. Read by: Rowe Robert, PA-C Electronically Signed   By: Sandi Mariscal M.D.   On: 01/19/2017 12:37   Ir Paracentesis  Result Date: 02/05/2017 INDICATION: Patient  with recurrent ascites. Request is made for therapeutic paracentesis of up to 6 L maximum. EXAM: ULTRASOUND GUIDED THERAPEUTIC PARACENTESIS MEDICATIONS: 10 mL 1% lidocaine COMPLICATIONS: None immediate. PROCEDURE: Informed written consent was obtained from the patient after a discussion of the risks, benefits and alternatives to treatment. A timeout was performed prior to the initiation of the procedure. Initial ultrasound scanning demonstrates a large amount of ascites within the left lateral abdomen. The left lateral abdomen was prepped and draped in the usual sterile fashion. 1% lidocaine was used for local anesthesia. Following this, a 19 gauge, 7-cm, Yueh catheter was introduced. An ultrasound image was saved for documentation purposes. The paracentesis was performed. The catheter was removed and a dressing  was applied. The patient tolerated the procedure well without immediate post procedural complication. FINDINGS: A total of approximately 6.0 liters of yellow, pain fluid was removed. IMPRESSION: Successful ultrasound-guided therapeutic paracentesis yielding 6.0 liters of peritoneal fluid. Read by:  Brynda Greathouse PA-C Electronically Signed   By: Jacqulynn Cadet M.D.   On: 02/05/2017 15:07    Labs:  CBC:  Recent Labs  11/13/16 1535 12/10/16 1009 01/18/17 1432 02/16/17 1102  WBC 4.9 2.4* 3.0* 4.7  HGB 14.9 14.1 13.1 12.5*  HCT 45.5 43.6 40.3 38.8  PLT 266 168 182 257    COAGS:  Recent Labs  06/08/16 1209 07/10/16 0604 02/01/17 1427 02/16/17 1102  INR 1.08 1.12 1.20* 1.10*  APTT  --  30  --   --     BMP:  Recent Labs  03/06/16 1012  05/26/16 1540 06/08/16 1209  10/30/16 1242 11/13/16 1535 12/10/16 1009 01/18/17 1432  NA 136  --  140 140  < > 141 142 142 143  K 4.5  --  4.3 4.3  < > 4.9 4.5 4.8 4.9  CL 99  --  101 101  --   --   --   --   --   CO2 28  --  33* 30  < > 31* 32* 29 28  GLUCOSE 169*  --  97 88  < > 106 109 163* 61*  BUN 10  --  13 9  < > 10.7 9.5 10.6 36.3*  CALCIUM 8.9  --  9.3 9.3  < > 8.5 8.4 8.7 8.4  CREATININE 1.00  < > 0.89 1.02  < > 0.8 0.8 0.8 0.9  GFRNONAA 80  --   --  >60  --   --   --   --   --   GFRAA >89  --   --  >60  --   --   --   --   --   < > = values in this interval not displayed.  LIVER FUNCTION TESTS:  Recent Labs  10/30/16 1242 11/13/16 1535 12/10/16 1009 01/18/17 1432  BILITOT 0.67 0.56 0.41 0.46  AST 31 32 38* 169*  ALT 21 21 20  71*  ALKPHOS 322* 347* 358* 465*  PROT 5.9* 6.2* 6.1* 6.3*  ALBUMIN 2.9* 3.0* 2.7* 2.7*    TUMOR MARKERS: No results for input(s): AFPTM, CEA, CA199, CHROMGRNA in the last 8760 hours.  Assessment and Plan:  Metastatic carcinoid tumor: After evaluation, the patient's current state of declined health (as noted by ECOG status and Karnofsky scale) patient would not be a good candidate  for PRRT. Patient's synthetic liver function is compromised as noted by decreased albumin, ascites, and increased INR. Will follow laboratory values and if hepatic synthetic function improves and ECOG  improves patient could be a candidate for PRRT in the future. Recommend resuming Sandostatin monthly therapy.  Thank you for this interesting consult.  I greatly enjoyed meeting Ruben Mccormick and look forward to participating in their care.  A copy of this report was sent to the requesting provider on this date.  Electronically Signed: Halina Andreas, MD 02/16/2017, 12:26 PM   I spent a total of 20 Minutes in face to face in clinical consultation, greater than 50% of which was counseling/coordinating care for metastatic neuroendocrine tumor.

## 2017-02-17 ENCOUNTER — Telehealth: Payer: Self-pay | Admitting: *Deleted

## 2017-02-17 NOTE — Telephone Encounter (Signed)
Was informed by Kennyth Lose in the lab re:  CMET and Chromogranin A samples were hemolyzed from labs drawn yesterday 02/16/17. Dr. Burr Medico notified.  Called pt on cell phone and left message on voice mail asking pt to come in at 1015 am Friday 02/19/17 for lab redraw prior to office visit.

## 2017-02-17 NOTE — Progress Notes (Signed)
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 No evidence of mediastinal node metastasis or distant metastasis. Groundglass nodule in the right upper lobe is indeterminate 8. No metabolic activity associated with the enhancing hepatic lesion in left and right hepatic lobes, fever up benign etiology or hemangiomas, but malignancy is not ruled out.      05/24/2016 Imaging    Abdomen MRI with and without contrast showed multiple enhancing liver lesions, the largest in the inferior right liver shows central necrosis, suspicious for malignancy.      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       06/11/2016 Procedure    Patient underwent EGD and colonoscopy by Dr. Ardis Hughs, no significant tumor, to the ileum and distal stomach biopsy showed inflammatory change, no evidence of carcinoid. One polyp in ascending colon was removed, showed tubular adenoma.      06/22/2016 -  Chemotherapy    Sandostatin injections every 4 weeks       10/23/2016 Imaging    CT ABD/PELVIS w/ Contrast on 10/23/16 IMPRESSION: No significant interval change in diffuse  hypervascular liver metastases. Stable subtle small lytic bone metastases within the pelvis. Increased large amount of ascites and diffuse body wall edema. New left abdominal omental soft tissue thickening, suspicious for peritoneal carcinomatosis.      10/27/2016 Procedure    US Paracentesis 10/27/16 IMPRESSION: Successful ultrasound-guided diagnostic and therapeutic paracentesis yielding 4 liters of peritoneal fluid.      10/27/2016 Pathology Results    Pathology 10/27/16 Diagnosis PERITONEAL/ASCITIC FLUID (SPECIMEN 1 OF 1 COLLECTED 10/27/16): REACTIVE MESOTHELIAL CELLS. NO MALIGNANT CELLS IDENTIFIED.      11/14/2016 -  Adjuvant Chemotherapy    Everolimus starting 11/14/16       11/14/2016 -  Chemotherapy    Everolimus started 11/14/16       11/24/2016 Procedure    US Paracentesis IMPRESSION: Successful ultrasound-guided diagnostic and therapeutic paracentesis yielding 5 liters of peritoneal fluid.       11/24/2016 Pathology Results    Diagnosis PERITONEAL/ASCITIC FLUID (SPECIMEN 1 OF 1 COLLECTED 11/24/16): REACTIVE MESOTHELIAL CELLS PRESENT. MIXED LYMPHOID POPULATION.      12/18/2016 Procedure    US Paracentesis 12/18/16 IMPRESSION: Successful ultrasound-guided diagnostic/therapeutic paracentesis yielding 5 liters of peritoneal fluid.      01/07/2017 PET scan    PET-DOTATATE 01/07/17 IMPRESSION: 1. Mild increase in radiotracer activity within hepatic lesions, upper abdominal lymph nodes, terminal ileum lesion, and skeletal metastasis. 2. No new lesions are identified. 3. New large volume ascites. No evidence of peritoneal carcinomatosis.       Adenocarcinoma of right lower lobe of lung ([place])   05/15/2016  Imaging    PET scan showed: 1. Intense metabolic activity associated with the RIGHT lower lobe pulmonary nodule is concerning for bronchogenic carcinoma. No evidence of mediastinal nodal metastasis or distant metastasis (potential stage IA). 2. Ground-glass nodule  RIGHT upper lobe is indeterminate. This lesion will require follow-uphowed       06/19/2016 Procedure    Bronchoscopy biopsy of the right lung mass was negative for malignancy      07/10/2016 Initial Diagnosis    Adenocarcinoma of right lung ([place])      07/10/2016 Procedure    CT-guided right lung mass biopsy showed non-small cell carcinoma, immunostaining was strongly positive for TTF-1, negative for CK 5/6, consistent with primary lung adenocarcinoma      07/31/2016 - 08/06/2016 Radiation Therapy    SBRT 54 Gy in 3 fractions of 18 Gy, by Dr. Tammi Klippel        HISTORY OF PRESENTING ILLNESS (05/15/2016):     Ruben Mccormick 61 y.o. male with past medical history of hepatitis B, hypertension, heavy smoking, is here because of his recent abnormal CT scan, which showed multiple liver lesions concerning for malignancy. He was referred by his primary care physician Dr. Melford Aase.    He has had diarrhea for 1.5 years, it has been getting worse lately, up 20 times a day. He was seen by Dr. Melford Aase and started taking Viberzi in 02/2016 which helps a lot. He underwent abdominal ultrasound, which showed a large lesion in the right lobe of the liver. He subsequently underwent abdominal and pelvis CT with contrast, which showed a dominant 7.6 cm necrotic mass in the inferior right lobe, and multiple other small lesions in the liver.   His last colonoscopy was 5 years ago which was normal per patient. He denies significant abdominal pain or discomfort, no melano or hemachizia, but he has mild heart burn and gassy feeling from [employer]. No other symptoms. He has chronic mild dyspnia from smoking. He lost about 25 lbs in the past 4-5 months. 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   He lives with his wife, has two daughters and one son. His previous hepatitis B test showed positive S antigen and E antibody, hepatitis C negative. Patient is  on not aware of his prior hepatitis B infection. He did use cocaine, but no IV drugs. No history of blood transfusion.  CURRENT THERAPY:   1. somatostatin injection every 4 weeks, started on 06/22/2016.  2. Everolimus started 11/14/16.   3. Xgeva injections every 4 weeks.   INTERIM HISTORY  Ruben Mccormick returns for follow-up. He presents to the clinic today with his wife. He feels better laying on the exam table.  He say Dr. Ilda Foil earlier this week. He reports his liver failure is causing a delay in treatment. His wife reports he gets pain from stomach and buttocks because he lays on his back often.  He has cough with phlegm But no fever. He is able to get around but only with a walker. He mostly stays in a bed.   Wife spoke to hospice and they will not accept him if he is still on aggressive treatment. But she would like him to get the hospice care and help.   Wife would like prescription for wheelchair and help with  He claims to not be in pain and that he does not use pain medication. His wife would like him to  have pain medication.   His wife would like to get a prescription for Marinol.    MEDICAL HISTORY:  Past Medical History:  Diagnosis Date  . History of kidney stones   . Hyperlipidemia   . Hypertension   . Hypogonadism male   . Lung cancer ([place])   . Pre-diabetes   . Vitamin D deficiency     SURGICAL HISTORY: Past Surgical History:  Procedure Laterality Date  . APPENDECTOMY    . COLONOSCOPY WITH PROPOFOL N/A 06/11/2016   Procedure: COLONOSCOPY WITH PROPOFOL;  Surgeon: Milus Banister, MD;  Location: [facility];  Service: Endoscopy;  Laterality: N/A;  . ESOPHAGOGASTRODUODENOSCOPY (EGD) WITH PROPOFOL N/A 06/11/2016   Procedure: ESOPHAGOGASTRODUODENOSCOPY (EGD) WITH PROPOFOL;  Surgeon: Milus Banister, MD;  Location: [facility];  Service: Endoscopy;  Laterality: N/A;  . IR PARACENTESIS  02/05/2017  . IR RADIOLOGIST EVAL & MGMT  02/16/2017  . VIDEO BRONCHOSCOPY WITH  ENDOBRONCHIAL NAVIGATION Right 06/19/2016   Procedure: VIDEO BRONCHOSCOPY WITH ENDOBRONCHIAL NAVIGATION with RLL brushings, washings and bx;  Surgeon: [name], MD;  Location: [facility];  Service: Thoracic;  Laterality: Right;    SOCIAL HISTORY: Social History   Social History  . Marital status: Married    Spouse name: N/A  . Number of children: N/A  . Years of education: N/A   Occupational History  . Not on file.   Social History Main Topics  . Smoking status: Former Smoker    Packs/day: 1.00    Years: 40.00    Types: Cigarettes    Quit date: 09/11/2015  . Smokeless tobacco: Never Used  . Alcohol use 16.8 oz/week    14 Shots of liquor, 14 Standard drinks or equivalent per week     Comment: 2 drinks daily since young   . Drug use: No     Comment: cocaine in the past, no iv drugs   . Sexual activity: Not on file   Other Topics Concern  . Not on file   Social History Narrative  . No narrative on file  Patient is on short-term disability at this time (12/09/16)  FAMILY HISTORY: Family History  Problem Relation Age of Onset  . Diabetes Father   . Cancer Father        mesothelioma   . Alzheimer's disease Mother   . Parkinson's disease Mother   . Cancer Paternal Grandfather        colon    ALLERGIES:  is allergic to bee venom; ppd [tuberculin purified protein derivative]; and voltaren [diclofenac sodium].  MEDICATIONS:  Current Outpatient Prescriptions  Medication Sig Dispense Refill  . aspirin 325 MG tablet Take 325 mg by mouth daily.    Marland Kitchen BREO ELLIPTA 100-25 MCG/INH AEPB TAKE 1 INHALATION BY MOUTH DAILY 1 each 2  . Cholecalciferol (VITAMIN D-3) 5000 UNITS TABS Take 5,000 Units by mouth daily.     Marland Kitchen everolimus (AFINITOR) 10 MG tablet Take 1 tablet (10 mg total) by mouth daily. (Patient taking differently: Take 10 mg by mouth daily. Started 11/14/16) 30 tablet 2  . megestrol (MEGACE ES) 625 MG/5ML suspension Take 5 mLs (625 mg total) by mouth daily. 150 mL 0  .  Multiple Vitamin (MULTIVITAMIN WITH MINERALS) TABS tablet Take 1 tablet by mouth daily.    . Telotristat Etiprate (XERMELO) 250 MG TABS Take 250 mg by mouth 3 (three) times daily with meals. (Patient taking differently: Take 250 mg by mouth 2 (two) times daily. ) 90 tablet 2  .  umeclidinium-vilanterol (ANORO ELLIPTA) 62.5-25 MCG/INH AEPB Inhale 1 puff into the lungs daily. 21 each 0  . fluticasone (FLONASE) 50 MCG/ACT nasal spray INSTILL 2 SPRAYS INTO THE NOSTRILS AT NIGHT AS NEEDED FOR CONGESTION  5  . oxyCODONE (OXY IR/ROXICODONE) 5 MG immediate release tablet Take 1 tablet (5 mg total) by mouth every 4 (four) hours as needed for severe pain. (Patient not taking: Reported on 02/19/2017) 30 tablet 0  . PREVIDENT 5000 BOOSTER PLUS 1.1 % PSTE Take 1 application by mouth daily.   3  . Triamcinolone Acetonide (TRIAMCINOLONE 0.1 % CREAM : EUCERIN) CREA Apply 1 application topically 2 (two) times daily. (Patient not taking: Reported on 01/18/2017) 1 each 11  . valACYclovir (VALTREX) 500 MG tablet TAKE 1 TABLET BY MOUTH TWICE DAILY AS NEEDED FOR FEVER BLISTER (Patient not taking: Reported on 01/18/2017) 90 tablet 1   No current facility-administered medications for this visit.    Facility-Administered Medications Ordered in Other Visits  Medication Dose Route Frequency Provider Last Rate Last Dose  . octreotide (SANDOSTATIN LAR) IM injection 30 mg  30 mg Intramuscular Once Truitt Merle, MD        REVIEW OF SYSTEMS:  Constitutional: Denies fevers, chills or abnormal night sweats (+) significant fatigue (+) Flushing (+) Poor appetite (+) Dizziness Eyes: Denies double vision or watery eyes (+) Blurry vision Ears, nose, mouth, throat, and face: Denies mucositis or sore throat Respiratory: Denies cough, dyspnea Cardiovascular: Denies palpitation, chest discomfort (+) Lower extremity edema. Gastrointestinal:  Denies nausea, heartburn (+) Abdominal distention, (+) pain (+) Diarrhea Skin:  (+) dry, itchy bumps on  skin over abdomen and back, (+) skin darkening is subsiding Lymphatics: Denies new lymphadenopathy or easy bruising MSK: (+) buttock pain  Neurological:Denies numbness, tingling or new weaknesses Behavioral/Psych: Mood is stable, no new changes  All other systems were reviewed with the patient and are negative.  PHYSICAL EXAMINATION:  ECOG PERFORMANCE STATUS: 2 Vitals:   02/19/17 1102  BP: 107/89  Pulse: 70  Resp: 16  Temp: 97.7 F (36.5 C)  TempSrc: Oral  SpO2: 96%  Height: 5\' 7"  (1.702 m)     GENERAL:alert, no distress and comfortable SKIN:  (+) very dry skin and sporadic scaly rash with scratch markers over abdomen and back (+) flushing EYES: normal, conjunctiva are pink and non-injected, sclera clear OROPHARYNX:no exudate, no erythema and lips, buccal mucosa, and tongue normal  NECK: supple, thyroid normal size, non-tender, without nodularity LYMPH:  no palpable lymphadenopathy in the cervical, axillary or inguinal LUNGS: clear to auscultation and percussion with normal breathing effort HEART: regular rate & rhythm and no murmurs and no lower extremity edema ABDOMEN:abdomen soft and normal bowel sounds, no organomegaly. (+) pain in middle abdomen (+) Large amount ascites, skin rash improved since last exam Musculoskeletal:no cyanosis of digits and no clubbing (+) swelling of bilateral lower extremities, more so of the left leg (+) Presents in a wheelchair and  PSYCH: alert & oriented x 3 with fluent speech NEURO: no focal motor/sensory deficits  LABORATORY DATA:  I have reviewed the data as listed CBC Latest Ref Rng & Units 02/16/2017 01/18/2017 12/10/2016  WBC 4.0 - 10.3 10e3/uL 4.7 3.0(L) 2.4(L)  Hemoglobin 13.0 - 17.1 g/dL 12.5(L) 13.1 14.1  Hematocrit 38.4 - 49.9 % 38.8 40.3 43.6  Platelets 140 - 400 10e3/uL 257 182 168   CMP Latest Ref Rng & Units 02/19/2017 01/18/2017 12/10/2016  Glucose 70 - 140 mg/dl 122 61(L) 163(H)  BUN 7.0 - 26.0 mg/dL 12.7  36.3(H) 10.6  Creatinine  0.7 - 1.3 mg/dL 0.8 0.9 0.8  Sodium 136 - 145 mEq/L 140 143 142  Potassium 3.5 - 5.1 mEq/L 4.5 4.9 4.8  Chloride 101 - 111 mmol/L - - -  CO2 22 - 29 mEq/L 26 28 29   Calcium 8.4 - 10.4 mg/dL 7.8(L) 8.4 8.7  Total Protein 6.4 - 8.3 g/dL 5.7(L) 6.3(L) 6.1(L)  Total Bilirubin 0.20 - 1.20 mg/dL 0.44 0.46 0.41  Alkaline Phos 40 - 150 U/L 314(H) 465(H) 358(H)  AST 5 - 34 U/L 39(H) 169(H) 38(H)  ALT 0 - 55 U/L 41 71(H) 20   PATHOLOGY REPORT  Diagnosis 06/08/2016 Liver, biopsy, Right hepatic lobe - METASTATIC LOW GRADE NEUROENDOCRINE TUMOR (CARCINOID TUMOR). - SEE COMMENT.  Diagnosis 06/11/2016 1. Ileum, biopsy, terminal - INFLAMMATORY PROLAPSE TYPE POLYP. - NO DYSPLASIA OR MALIGNANCY. 2. Colon, polyp(s), ascending - TUBULAR ADENOMA (X3 FRAGMENTS). - NO HIGH GRADE DYSPLASIA OR MALIGNANCY. 3. Stomach, biopsy, distal - CHRONIC ACTIVE GASTRITIS. - NEGATIVE FOR HELICOBACTER PYLORI. - NO INTESTINAL METAPLASIA, DYSPLASIA, OR MALIGNANCY. - NO EVIDENCE OF CARCINOID.  Diagnosis 11/24/16 PERITONEAL/ASCITIC FLUID (SPECIMEN 1 OF 1 COLLECTED 11/24/16): REACTIVE MESOTHELIAL CELLS PRESENT. MIXED LYMPHOID POPULATION.  Diagnosis 07/10/2016 Lung, needle/core biopsy(ies), Right Lower Lobe - NON SMALL CELL CARCINOMA.  PROCEDURES US Paracentesis 10/27/16 IMPRESSION: Successful ultrasound-guided diagnostic and therapeutic paracentesis yielding 4 liters of peritoneal fluid. Pathology 10/27/16 Diagnosis PERITONEAL/ASCITIC FLUID (SPECIMEN 1 OF 1 COLLECTED 10/27/16): REACTIVE MESOTHELIAL CELLS. NO MALIGNANT CELLS IDENTIFIED.  US Paracentesis 11/24/16 IMPRESSION: Successful ultrasound-guided diagnostic and therapeutic paracentesis yielding 5 liters of peritoneal fluid. Diagnosis  PERITONEAL/ASCITIC FLUID (SPECIMEN 1 OF 1 COLLECTED 11/24/16): REACTIVE MESOTHELIAL CELLS PRESENT. MIXED LYMPHOID POPULATION.  US Paracentesis 12/18/16 IMPRESSION: Successful ultrasound-guided diagnostic/therapeutic  paracentesis yielding 5 liters of peritoneal fluid.  RADIOGRAPHIC STUDIES: I have personally reviewed the radiological images as listed and agreed with the findings in the report. Ir Paracentesis  Result Date: 02/05/2017 INDICATION: Patient with recurrent ascites. Request is made for therapeutic paracentesis of up to 6 L maximum. EXAM: ULTRASOUND GUIDED THERAPEUTIC PARACENTESIS MEDICATIONS: 10 mL 1% lidocaine COMPLICATIONS: None immediate. PROCEDURE: Informed written consent was obtained from the patient after a discussion of the risks, benefits and alternatives to treatment. A timeout was performed prior to the initiation of the procedure. Initial ultrasound scanning demonstrates a large amount of ascites within the left lateral abdomen. The left lateral abdomen was prepped and draped in the usual sterile fashion. 1% lidocaine was used for local anesthesia. Following this, a 19 gauge, 7-cm, Yueh catheter was introduced. An ultrasound image was saved for documentation purposes. The paracentesis was performed. The catheter was removed and a dressing was applied. The patient tolerated the procedure well without immediate post procedural complication. FINDINGS: A total of approximately 6.0 liters of yellow, pain fluid was removed. IMPRESSION: Successful ultrasound-guided therapeutic paracentesis yielding 6.0 liters of peritoneal fluid. Read by:  Brynda Greathouse PA-C Electronically Signed   By: Jacqulynn Cadet M.D.   On: 02/05/2017 15:07   EGD 06/11/2016 - Normal esophagus. - Normal examined duodenum. - Abnromal distal stomach, this was somewhat neoplastic appearing, biospied extensively. - The examination was otherwise normal.  Colonoscopy 06/11/2016: One abnormal appearing ileal polypoid leion in the terminal ileum. Biopsied. - Two 8 to 11 mm polyps in the transverse colon and in the ascending colon, removed with a hot snare. Resected and retrieved. - One 5 mm polyp in the transverse colon, removed  with a cold snare. Resected and retrieved. - Diverticulosis in the left  colon.  ECHO 06/05/2016 - Left ventricle: The cavity size was normal. Left ventricular   diastolic function parameters were normal. - Ventricular septum: The contour showed diastolic flattening.   These changes are consistent with RV volume overload. - Right ventricle: The cavity size was severely dilated. Systolic   function was mildly reduced. - Right atrium: The atrium was severely dilated. - Tricuspid valve: Mobility was restricted. There was malcoaptation   of the valve leaflets. There was wide-open regurgitation. 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  ASSESSMENT & PLAN:  61 y.o. male with past medical history of hypertension, hepatitis B infection, presented with worsening diarrhea for one and half years, fatigue, anorexia and weight loss.  1. 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 I spoke with radiologist Dr. Leonia Reeves and we think his primary carcinoid tumor is probably in the terminal ileum. The PET scan also reviewed multiple bone metastasis, he is not symptomatic from the bone mets.  -I previously discussed his liver biopsy results, which showed metastatic carcinoid tumor, low-grade -His EGD showed abnormal appearing of the distal stomach, however the biopsy only showed inflammatory change. Colonoscopy was also an negative except polyps, and biopsy was negative for carcinoid tumor. --We previously discussed the nature history of metastatic carcinoid tumor. Due to his diffuse liver and bone metastasis, I do not think he is a candidate for upfront  metastasectomy, and his disease is likely incurable. We discussed this is a indolent disease, and most people do well with treatment -the goal of therapy is palliative, to prolong his life and improve his quality of life  -He has previously started Sandostatin injection, tolerated well, but he is more symptomatic lately, with new ascites and fatigue  -his diarrhea has improved with Xermelo 250mg  2-3 times daily. I encourage him to take 3 times a day, will continue -Repeat CT abdomen and pelvis on 10/23/16 which showed no significant interval change in diffuse hypervascular liver metastases and stable subtle small lytic bone metastases within the pelvis. However, there was increased large amount of ascites and diffuse body wall edema and new left abdominal omental soft tissue thickening, suspicious for peritoneal carcinomatosis. -He has started second line therapy with Everolimus, tolerates OK with fatigue and anorexia, he has noticed his skin changes (rashes improved, hyperpigmentation of face has improved) -I reviewed his restaging PET scan from Jan 07, 2017, which showed slightly disease progression in liver, lymph nodes and bone no other new lesions. No PET evidence of peritoneal carcinomatosis - Continue Afinitor for now. -Our LU177-DOTATATE (Lutathera) is ready to start and his insurance has approval for the treatment. However he is knowledgeable due to his hypoalbuminemia and liver dysfunction -Giving his rapid overall deterioration, lack of effective treatment, extreme poor performance status, it has been increasingly difficult for patients to coming to the clinic, I recommend palliative care and hospice. I discussed this with patient and his wife, he is not ready for it. -labs reviewed and His protein level is very low  -I encouraged him to stop drinking alcohol and how it has effected and will continue to effect his liver function -He will proceed with Sandostatin injection today   2. Ascites  and abodominal Pain -Caused by the patient's recurrent ascites. -We discussed that if he develops additional ascites, then placement of a PleurX catheter is advised. -I prescribed Oxy IR 5 mg every 4 hours PRN on 01/18/17. -Due to pain in stomach he often lays down. I encourage him to move often so he does not develop bed sores.  -Because of extended abdomen due to ascites he will he will get Parcentesis at 3pm today. I highly suggest he get a pleurx catheter placed next week to help draining.   3. Carcinoid syndrome -He previously had chronic diarrhea, vasodilation and discoloration on his face, and tricuspid valve abnormality and a severe right heart dilatation secondary to his carcinoid syndrome -He will continue to follow with cardiology -Sandostatin injection has been held since 11/13/16 for his PET -DOTATATE on 01/07/17. -I previously started him on Xermelo for his diarrhea which is working well, will continue   4. Skin rashes -he previously had diffuse skin rashes on his trunk, not sure if it's related to his carcinoid syndrome. It's very itchy, and the bothersome. He has tried over-the-counter and prescription topical steroids, which does not help. -This is improved since he started Afinitor. His skin rash is probably related to carcinoid syndrome.  5. Adenocarcinoma of right lung, cT1N0M0, stage I  -His right lower lobe nodule is intensely hypermetabolic on PET scan, no significant adenopathy or other hypermetabolic lesion suggesting metastasis. Biopsy confirmed non-small cell lung cancer, consistent with adenocarcinoma. -He is s/p SBRT to his lung cancer, will follow up with Dr. Tammi Klippel  6. Bone Health -due to his diffuse bone metastasis, I recommended him to start monthly Xgeva injection on 10/16/16. Potential side effects, especially jaw necrosis, were discussed with patient. I previously encouraged him to take calcium and vitamin D. -He is tolerating well, we'll continue today   7.  Anorexia and weight loss -Secondary of his metastatic disease and ascites. -I have prescribed Megace on 01/18/17 to stimulate his appetite.  -Wife asks for more help with his appetite, I will prescribe him Marinol   8. Goal of care discussion  -We again had long conversation with pt and his wife regarding the incurable nature of his metastatic carcinoid tumor, due to the rapid deterioration of his overall condition and performance status, his prognosis is dismal -The patient understands the goal of care is palliative. -We discussed Hospice referral and the patient and his wife agreed today   -I recommend DO NOT RESUSCITATE and DO NOT INTUBATE, He agrees to DNR/DNI  9. Supportive care/Hospice -Due to his declining health/liver function I suggest he gets hospice care -I discussed what hospice will be able to help him with as a long term care -I will refer to hospice and see if they can help with monthly Sandostatin injection -I suggest starting hospice after pleurx catheter placement   Plan -Parcentesis at 3pm today  -refill mirtazapine and prescribe tramadol and Marinol  -prescription for wheelchair  -refer to IR for abdominal pleurx catheter placement next week  -refer to hospice next week  -Labs reviewed today -Proceed with Xgeva and Sandostatin injection today, and the patient wishes to continue Sandostatin injection, 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 The patient knows to call the clinic with any problems, questions or concerns.  I spent 35 minutes counseling the patient face to face. The total time spent in the appointment was 45 minutes and more than 50% was on counseling.  Truitt Merle, MD 02/19/2017   This document serves as a record of services personally performed by Truitt Merle, MD. It was created on her behalf by Joslyn Devon, a trained medical scribe. The creation of this record is based on the  scribe's personal observations and the provider's statements to them. This document has been checked and approved by the attending provider.

## 2017-02-19 ENCOUNTER — Ambulatory Visit (HOSPITAL_BASED_OUTPATIENT_CLINIC_OR_DEPARTMENT_OTHER): Payer: Commercial Managed Care - PPO | Admitting: Hematology

## 2017-02-19 ENCOUNTER — Other Ambulatory Visit: Payer: Commercial Managed Care - PPO

## 2017-02-19 ENCOUNTER — Ambulatory Visit (HOSPITAL_BASED_OUTPATIENT_CLINIC_OR_DEPARTMENT_OTHER): Payer: Commercial Managed Care - PPO

## 2017-02-19 ENCOUNTER — Other Ambulatory Visit (HOSPITAL_BASED_OUTPATIENT_CLINIC_OR_DEPARTMENT_OTHER): Payer: Commercial Managed Care - PPO

## 2017-02-19 ENCOUNTER — Ambulatory Visit (HOSPITAL_COMMUNITY)
Admission: RE | Admit: 2017-02-19 | Discharge: 2017-02-19 | Disposition: A | Discharge status: Home / Self Care | Payer: Commercial Managed Care - PPO | Source: Ambulatory Visit | Attending: Hematology | Admitting: Hematology

## 2017-02-19 VITALS — BP 107/89 | HR 70 | Temp 97.7°F | Resp 16 | Ht 67.0 in

## 2017-02-19 DIAGNOSIS — E340 Carcinoid syndrome: Secondary | ICD-10-CM

## 2017-02-19 DIAGNOSIS — C7B02 Secondary carcinoid tumors of liver: Secondary | ICD-10-CM

## 2017-02-19 DIAGNOSIS — C7B03 Secondary carcinoid tumors of bone: Secondary | ICD-10-CM | POA: Insufficient documentation

## 2017-02-19 DIAGNOSIS — R109 Unspecified abdominal pain: Secondary | ICD-10-CM

## 2017-02-19 DIAGNOSIS — R188 Other ascites: Secondary | ICD-10-CM

## 2017-02-19 DIAGNOSIS — C3431 Malignant neoplasm of lower lobe, right bronchus or lung: Secondary | ICD-10-CM | POA: Insufficient documentation

## 2017-02-19 DIAGNOSIS — R21 Rash and other nonspecific skin eruption: Secondary | ICD-10-CM | POA: Diagnosis not present

## 2017-02-19 DIAGNOSIS — Z7189 Other specified counseling: Secondary | ICD-10-CM

## 2017-02-19 DIAGNOSIS — R630 Anorexia: Secondary | ICD-10-CM | POA: Diagnosis not present

## 2017-02-19 DIAGNOSIS — R634 Abnormal weight loss: Secondary | ICD-10-CM

## 2017-02-19 LAB — COMPREHENSIVE METABOLIC PANEL
ALK PHOS: 314 U/L — AB (ref 40–150)
ALT: 41 U/L (ref 0–55)
ANION GAP: 11 meq/L (ref 3–11)
AST: 39 U/L — ABNORMAL HIGH (ref 5–34)
Albumin: 2.2 g/dL — ABNORMAL LOW (ref 3.5–5.0)
BILIRUBIN TOTAL: 0.44 mg/dL (ref 0.20–1.20)
BUN: 12.7 mg/dL (ref 7.0–26.0)
CO2: 26 meq/L (ref 22–29)
Calcium: 7.8 mg/dL — ABNORMAL LOW (ref 8.4–10.4)
Chloride: 103 mEq/L (ref 98–109)
Creatinine: 0.8 mg/dL (ref 0.7–1.3)
GLUCOSE: 122 mg/dL (ref 70–140)
POTASSIUM: 4.5 meq/L (ref 3.5–5.1)
SODIUM: 140 meq/L (ref 136–145)
Total Protein: 5.7 g/dL — ABNORMAL LOW (ref 6.4–8.3)

## 2017-02-19 MED ORDER — TRAMADOL HCL 50 MG PO TABS: 50.0000 mg | ORAL_TABLET | Freq: Four times a day (QID) | ORAL | 2 refills | Status: AC | PRN

## 2017-02-19 MED ORDER — DENOSUMAB 120 MG/1.7ML ~~LOC~~ SOLN
120.0000 mg | Freq: Once | SUBCUTANEOUS | Status: AC
  Administered 2017-02-19: 120 mg via SUBCUTANEOUS
  Filled 2017-02-19: qty 1.7

## 2017-02-19 MED ORDER — OCTREOTIDE ACETATE 30 MG IM KIT
30.0000 mg | PACK | Freq: Once | INTRAMUSCULAR | Status: AC
  Administered 2017-02-19: 30 mg via INTRAMUSCULAR
  Filled 2017-02-19: qty 1

## 2017-02-19 MED ORDER — DRONABINOL 2.5 MG PO CAPS: 2.5000 mg | ORAL_CAPSULE | Freq: Two times a day (BID) | ORAL | 2 refills | Status: AC

## 2017-02-19 MED ORDER — MEGESTROL ACETATE 625 MG/5ML PO SUSP: 625.0000 mg | Freq: Every day | ORAL | 3 refills | Status: AC

## 2017-02-19 NOTE — Procedures (Signed)
Ultrasound-guided  therapeutic paracentesis performed yielding 5 liters (maximum ordered) of hazy, yellow fluid. No immediate complications.

## 2017-02-21 ENCOUNTER — Encounter: Payer: Self-pay | Admitting: Hematology

## 2017-02-22 ENCOUNTER — Telehealth: Payer: Self-pay | Admitting: *Deleted

## 2017-02-22 ENCOUNTER — Ambulatory Visit (HOSPITAL_COMMUNITY): Payer: Commercial Managed Care - PPO

## 2017-02-22 NOTE — Telephone Encounter (Signed)
Call from pt's wife requesting handicap placard. She requests form to be mailed to their home as they live an hour away. Message to collaborative RN to complete form.

## 2017-02-22 NOTE — Addendum Note (Signed)
Addended by: Truitt Merle on: 02/22/2017 11:16 AM   Modules accepted: Orders

## 2017-02-22 NOTE — Telephone Encounter (Signed)
Mailed handicap form signed by Dr. Burr Medico to pt's home as per wife's request.

## 2017-02-22 NOTE — Telephone Encounter (Signed)
Spoke with Lovenia Shuck, new pt referral for [facility] and referral made today for pt.  Informed Jeani Hawking that pt will have PleurX catheter insertion this week for home management of recurrent ascites.  Per Jeani Hawking, hospice nurse will assist pt and family with catheter management.  Faxed all pertinent information to [place]. [facility]     Phone      [phone]       ;     Fax        [phone].

## 2017-02-23 LAB — CHROMOGRANIN A: Chromogranin A: 225 nmol/L — ABNORMAL HIGH (ref 0–5)

## 2017-03-01 ENCOUNTER — Telehealth: Payer: Self-pay | Admitting: *Deleted

## 2017-03-01 NOTE — Telephone Encounter (Signed)
Faxed [employer] forms to [place]. Fax     [phone]      ;      Phone     [phone].

## 2017-03-02 ENCOUNTER — Other Ambulatory Visit: Payer: Self-pay | Admitting: Student

## 2017-03-02 ENCOUNTER — Other Ambulatory Visit: Payer: Self-pay | Admitting: Radiology

## 2017-03-03 ENCOUNTER — Other Ambulatory Visit: Payer: Self-pay | Admitting: Hematology

## 2017-03-03 ENCOUNTER — Ambulatory Visit (HOSPITAL_COMMUNITY)
Admission: RE | Admit: 2017-03-03 | Discharge: 2017-03-03 | Disposition: A | Discharge status: Home / Self Care | Payer: Commercial Managed Care - PPO | Source: Ambulatory Visit | Attending: Hematology | Admitting: Hematology

## 2017-03-03 ENCOUNTER — Encounter (HOSPITAL_COMMUNITY): Payer: Self-pay

## 2017-03-03 DIAGNOSIS — I10 Essential (primary) hypertension: Secondary | ICD-10-CM | POA: Diagnosis not present

## 2017-03-03 DIAGNOSIS — C7B02 Secondary carcinoid tumors of liver: Secondary | ICD-10-CM

## 2017-03-03 DIAGNOSIS — Z7951 Long term (current) use of inhaled steroids: Secondary | ICD-10-CM | POA: Insufficient documentation

## 2017-03-03 DIAGNOSIS — R180 Malignant ascites: Secondary | ICD-10-CM | POA: Diagnosis present

## 2017-03-03 DIAGNOSIS — Z7982 Long term (current) use of aspirin: Secondary | ICD-10-CM | POA: Insufficient documentation

## 2017-03-03 DIAGNOSIS — R7303 Prediabetes: Secondary | ICD-10-CM | POA: Diagnosis not present

## 2017-03-03 DIAGNOSIS — E559 Vitamin D deficiency, unspecified: Secondary | ICD-10-CM | POA: Insufficient documentation

## 2017-03-03 DIAGNOSIS — E785 Hyperlipidemia, unspecified: Secondary | ICD-10-CM | POA: Diagnosis not present

## 2017-03-03 DIAGNOSIS — C7A00 Malignant carcinoid tumor of unspecified site: Secondary | ICD-10-CM | POA: Insufficient documentation

## 2017-03-03 DIAGNOSIS — Z85118 Personal history of other malignant neoplasm of bronchus and lung: Secondary | ICD-10-CM | POA: Insufficient documentation

## 2017-03-03 HISTORY — PX: IR PERC TUN PERIT CATH WO PORT S&I /IMAG: IMG2327

## 2017-03-03 LAB — CBC WITH DIFFERENTIAL/PLATELET
BASOS PCT: 0 %
Basophils Absolute: 0 10*3/uL (ref 0.0–0.1)
EOS ABS: 0 10*3/uL (ref 0.0–0.7)
Eosinophils Relative: 0 %
HEMATOCRIT: 38.1 % — AB (ref 39.0–52.0)
HEMOGLOBIN: 12.4 g/dL — AB (ref 13.0–17.0)
LYMPHS ABS: 0.9 10*3/uL (ref 0.7–4.0)
Lymphocytes Relative: 13 %
MCH: 31.2 pg (ref 26.0–34.0)
MCHC: 32.5 g/dL (ref 30.0–36.0)
MCV: 95.7 fL (ref 78.0–100.0)
Monocytes Absolute: 0.6 10*3/uL (ref 0.1–1.0)
Monocytes Relative: 9 %
NEUTROS ABS: 5.2 10*3/uL (ref 1.7–7.7)
NEUTROS PCT: 78 %
Platelets: 425 10*3/uL — ABNORMAL HIGH (ref 150–400)
RBC: 3.98 MIL/uL — AB (ref 4.22–5.81)
RDW: 18.8 % — ABNORMAL HIGH (ref 11.5–15.5)
WBC: 6.8 10*3/uL (ref 4.0–10.5)

## 2017-03-03 LAB — COMPREHENSIVE METABOLIC PANEL
ALBUMIN: 2.4 g/dL — AB (ref 3.5–5.0)
ALK PHOS: 231 U/L — AB (ref 38–126)
ALT: 27 U/L (ref 17–63)
AST: 35 U/L (ref 15–41)
Anion gap: 8 (ref 5–15)
BUN: 21 mg/dL — ABNORMAL HIGH (ref 6–20)
CALCIUM: 7.8 mg/dL — AB (ref 8.9–10.3)
CO2: 27 mmol/L (ref 22–32)
CREATININE: 0.82 mg/dL (ref 0.61–1.24)
Chloride: 103 mmol/L (ref 101–111)
GFR calc Af Amer: >60 mL/min (ref >60)
GFR calc non Af Amer: >60 mL/min (ref >60)
GLUCOSE: 65 mg/dL (ref 65–99)
Potassium: 4.9 mmol/L (ref 3.5–5.1)
SODIUM: 138 mmol/L (ref 135–145)
Total Bilirubin: 0.7 mg/dL (ref 0.3–1.2)
Total Protein: 5.8 g/dL — ABNORMAL LOW (ref 6.5–8.1)

## 2017-03-03 LAB — PROTIME-INR
INR: 1.08
Prothrombin Time: 14 seconds (ref 11.4–15.2)

## 2017-03-03 MED ORDER — SODIUM CHLORIDE 0.9 % IV SOLN
INTRAVENOUS | Status: DC
  Administered 2017-03-03: 10:00:00 via INTRAVENOUS

## 2017-03-03 MED ORDER — MIDAZOLAM HCL 2 MG/2ML IJ SOLN
INTRAMUSCULAR | Status: AC
  Filled 2017-03-03: qty 4

## 2017-03-03 MED ORDER — LIDOCAINE HCL 1 % IJ SOLN
INTRAMUSCULAR | Status: AC
  Filled 2017-03-03: qty 20

## 2017-03-03 MED ORDER — MIDAZOLAM HCL 2 MG/2ML IJ SOLN
INTRAMUSCULAR | Status: AC | PRN
  Administered 2017-03-03: 1 mg via INTRAVENOUS

## 2017-03-03 MED ORDER — CEFAZOLIN SODIUM-DEXTROSE 2-4 GM/100ML-% IV SOLN
2.0000 g | INTRAVENOUS | Status: AC
  Administered 2017-03-03: 2 g via INTRAVENOUS

## 2017-03-03 MED ORDER — FENTANYL CITRATE (PF) 100 MCG/2ML IJ SOLN
INTRAMUSCULAR | Status: AC
  Filled 2017-03-03: qty 4

## 2017-03-03 MED ORDER — CEFAZOLIN SODIUM-DEXTROSE 2-4 GM/100ML-% IV SOLN
INTRAVENOUS | Status: AC
  Filled 2017-03-03: qty 100

## 2017-03-03 MED ORDER — FENTANYL CITRATE (PF) 100 MCG/2ML IJ SOLN
INTRAMUSCULAR | Status: AC | PRN
  Administered 2017-03-03: 50 ug via INTRAVENOUS

## 2017-03-03 MED ORDER — LIDOCAINE HCL 1 % IJ SOLN
INTRAMUSCULAR | Status: AC | PRN
  Administered 2017-03-03: 10 mL via INTRADERMAL

## 2017-03-03 NOTE — Procedures (Signed)
Interventional Radiology Procedure Note  Procedure:  Tunneled peritoneal PleurX drainage catheter placement  Complications: None  Estimated Blood Loss: None  14 Fr tunneled PleurX tunneled peritoneal drain placed from right lateral approach.  Good return of ascites. Large volume paracentesis ongoing from drain now.  Venetia Night. Kathlene Cote, M.D Pager:  [phone]

## 2017-03-03 NOTE — H&P (Signed)
Referring Physician(s): Feng,Yan  Supervising Physician: Aletta Edouard  Patient Status:  [facility] OP  Chief Complaint: "I'm getting a drain put in my belly"   Subjective: Patient familiar to IR service from prior liver lesion biopsy and right lung biopsy in 2017. He has also underwent multiple paracenteses, last of which was on 7/13 and yielding 5 liters. 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 He has had significant decline in his overall performance and clinical status and is under hospice care. He presents today for tunneled peritoneal catheter placement for management of recurrent ascites. He currently denies fever, headache, chest pain, worsening dyspnea, cough, nausea ,vomiting or abnormal bleeding. He does have abdominal distention and back pain.  Past Medical History:  Diagnosis Date  . History of kidney stones   . Hyperlipidemia   . Hypertension   . Hypogonadism male   . Lung cancer ([place])   . Pre-diabetes   . Vitamin D deficiency    Past Surgical History:  Procedure Laterality Date  . APPENDECTOMY    . COLONOSCOPY WITH PROPOFOL N/A 06/11/2016   Procedure: COLONOSCOPY WITH PROPOFOL;  Surgeon: Milus Banister, MD;  Location: [facility];  Service: Endoscopy;  Laterality: N/A;  . ESOPHAGOGASTRODUODENOSCOPY (EGD) WITH PROPOFOL N/A 06/11/2016   Procedure: ESOPHAGOGASTRODUODENOSCOPY (EGD) WITH PROPOFOL;  Surgeon: Milus Banister, MD;  Location: [facility];  Service: Endoscopy;  Laterality: N/A;  . IR PARACENTESIS  02/05/2017  . IR RADIOLOGIST EVAL & MGMT  02/16/2017  . VIDEO BRONCHOSCOPY WITH ENDOBRONCHIAL NAVIGATION Right 06/19/2016   Procedure: VIDEO BRONCHOSCOPY WITH ENDOBRONCHIAL NAVIGATION with RLL brushings, washings and bx;  Surgeon: [name], MD;  Location: [facility];  Service: Thoracic;  Laterality: Right;      Allergies: Bee venom; Ppd [tuberculin purified protein derivative]; and Voltaren  [diclofenac sodium]  Medications: Prior to Admission medications   Medication Sig Start Date End Date Taking? Authorizing Provider  aspirin 325 MG tablet Take 325 mg by mouth daily.   Yes [provider]  BREO ELLIPTA 100-25 MCG/INH AEPB TAKE 1 INHALATION BY MOUTH DAILY 02/01/17  Yes Unk Pinto, MD  Cholecalciferol (VITAMIN D-3) 5000 UNITS TABS Take 5,000 Units by mouth daily.    Yes [provider]  dronabinol (MARINOL) 2.5 MG capsule Take 1 capsule (2.5 mg total) by mouth 2 (two) times daily before a meal. 02/19/17  Yes Truitt Merle, MD  fluticasone (FLONASE) 50 MCG/ACT nasal spray INSTILL 2 SPRAYS INTO THE NOSTRILS AT NIGHT AS NEEDED FOR CONGESTION 12/19/15  Yes [provider]  megestrol (MEGACE ES) 625 MG/5ML suspension Take 5 mLs (625 mg total) by mouth daily. 02/19/17  Yes Truitt Merle, MD  Multiple Vitamin (MULTIVITAMIN WITH MINERALS) TABS tablet Take 1 tablet by mouth daily.   Yes [provider]  PREVIDENT 5000 BOOSTER PLUS 1.1 % PSTE Take 1 application by mouth daily.  11/11/15  Yes [provider]  Telotristat Etiprate (XERMELO) 250 MG TABS Take 250 mg by mouth 3 (three) times daily with meals. Patient taking differently: Take 250 mg by mouth 2 (two) times daily.  08/21/16  Yes Truitt Merle, MD  traMADol (ULTRAM) 50 MG tablet Take 1 tablet (50 mg total) by mouth every 6 (six) hours as needed. 02/19/17  Yes Truitt Merle, MD  umeclidinium-vilanterol [facility] ELLIPTA) 62.5-25 MCG/INH AEPB Inhale 1 puff into the lungs daily. 10/05/16  Yes Forcucci, Courtney, PA-C  everolimus (AFINITOR) 10 MG tablet Take 1  tablet (10 mg total) by mouth daily. Patient taking differently: Take 10 mg by mouth daily. Started 11/14/16 11/16/16   Truitt Merle, MD  oxyCODONE (OXY IR/ROXICODONE) 5 MG immediate release tablet Take 1 tablet (5 mg total) by mouth every 4 (four) hours as needed for severe pain. Patient not taking: Reported on 02/19/2017 01/18/17   Truitt Merle, MD  Triamcinolone  Acetonide (TRIAMCINOLONE 0.1 % CREAM : EUCERIN) CREA Apply 1 application topically 2 (two) times daily. Patient not taking: Reported on 01/18/2017 07/16/14   Unk Pinto, MD  valACYclovir (VALTREX) 500 MG tablet TAKE 1 TABLET BY MOUTH TWICE DAILY AS NEEDED FOR FEVER BLISTER Patient not taking: Reported on 01/18/2017 06/21/15   Unk Pinto, MD     Vital Signs: BP 112/85 (BP Location: Right Arm)   Pulse 74   Temp (!) 94.5 F (34.7 C) (Axillary)   Resp 16   SpO2 91%   Physical Exam awake, alert. Chest with distant breath sounds bilaterally. Heart with regular rate and rhythm. Abdomen distended, positive bowel sounds, nontender. 2-3+ bilateral lower extremity edema. Flushed appearance to skin.  Imaging: No results found.  Labs:  CBC:  Recent Labs  12/10/16 1009 01/18/17 1432 02/16/17 1102 03/03/17 0955  WBC 2.4* 3.0* 4.7 6.8  HGB 14.1 13.1 12.5* 12.4*  HCT 43.6 40.3 38.8 38.1*  PLT 168 182 257 425*    COAGS:  Recent Labs  06/08/16 1209 07/10/16 0604 02/01/17 1427 02/16/17 1102  INR 1.08 1.12 1.20* 1.10*  APTT  --  30  --   --     BMP:  Recent Labs  03/06/16 1012  05/26/16 1540 06/08/16 1209  11/13/16 1535 12/10/16 1009 01/18/17 1432 02/19/17 1017  NA 136  --  140 140  < > 142 142 143 140  K 4.5  --  4.3 4.3  < > 4.5 4.8 4.9 4.5  CL 99  --  101 101  --   --   --   --   --   CO2 28  --  33* 30  < > 32* 29 28 26   GLUCOSE 169*  --  97 88  < > 109 163* 61* 122  BUN 10  --  13 9  < > 9.5 10.6 36.3* 12.7  CALCIUM 8.9  --  9.3 9.3  < > 8.4 8.7 8.4 7.8*  CREATININE 1.00  < > 0.89 1.02  < > 0.8 0.8 0.9 0.8  GFRNONAA 80  --   --  >60  --   --   --   --   --   GFRAA >89  --   --  >60  --   --   --   --   --   < > = values in this interval not displayed.  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 He has had significant decline in his overall performance and clinical status and is under hospice care. He presents today for tunneled peritoneal catheter placement for management of recurrent ascites. Details/risks of procedure, including but not limited to, internal bleeding,  infection, injury to adjacent structures, d/w with patient with his understanding and consent.   Electronically Signed: D. Rowe Robert, PA-C 03/03/2017, 10:28 AM   I spent a total of 20 minutes at the the patient's bedside AND on the patient's hospital floor or unit, greater than 50% of which was counseling/coordinating care for tunneled peritoneal catheter placement

## 2017-03-03 NOTE — Discharge Instructions (Signed)
Chest Tube Insertion, Adult, Care After This sheet gives you information about how to care for yourself after your procedure. Your health care provider may also give you more specific instructions. If you have problems or questions, contact your health care provider. What can I expect after the procedure? After the procedure, it is common to have:  Mild discomfort.  Slight bruising.  Follow these instructions at home: Incision care   Follow instructions from your health care provider about how to take care of your incision. Make sure you: ? Wash your hands with soap and water before you change your bandage (dressing). If soap and water are not available, use hand sanitizer. ? Change your dressing as told by your health care provider. ? Leave stitches (sutures), skin glue, or adhesive strips in place. These skin closures may need to stay in place for 2 weeks or longer. If adhesive strip edges start to loosen and curl up, you may trim the loose edges. Do not remove adhesive strips completely unless your health care provider tells you to do that.  Check your incision area every day for signs of infection. Check for: ? More redness, swelling, or pain. ? More fluid or blood. ? Warmth. ? Pus or a bad smell.  Do not take baths, swim, or use a hot tub until your health care provider approves. Ask your health care provider if you can take showers or have a sponge bath. You may need to keep the incision area dry for a few days. General instructions  Take over-the-counter and prescription medicines only as told by your health care provider.  Return to your normal activities as told by your health care provider. Ask your health care provider what activities are safe.  Keep all follow-up visits as told by your health care provider. This is important.  Do not drive for 24 hours if you were given a medicine to help you relax (sedative). Contact a health care provider if:  You have chills or  fever.  You have pain that is not controlled with medicine.  You have more redness, swelling, or pain around your incision.  You have more fluid or blood coming from your incision.  Your incision feels warm to the touch.  You have pus or a bad smell coming from the incision. Get help right away if:  You have chest pain or trouble breathing.  You develop red streaking on your skin around your incision. This information is not intended to replace advice given to you by your health care provider. Make sure you discuss any questions you have with your health care provider. Document Released: 05/17/2013 Document Revised: 02/14/2016 Document Reviewed: 01/08/2016 Elsevier Interactive Patient Education  2018 [place].   Moderate Conscious Sedation, Adult, Care After These instructions provide you with information about caring for yourself after your procedure. Your health care provider may also give you more specific instructions. Your treatment has been planned according to current medical practices, but problems sometimes occur. Call your health care provider if you have any problems or questions after your procedure. What can I expect after the procedure? After your procedure, it is common:  To feel sleepy for several hours.  To feel clumsy and have poor balance for several hours.  To have poor judgment for several hours.  To vomit if you eat too soon.  Follow these instructions at home: For at least 24 hours after the procedure:   Do not: ? Participate in activities where you could fall  or become injured. ? Drive. ? Use heavy machinery. ? Drink alcohol. ? Take sleeping pills or medicines that cause drowsiness. ? Make important decisions or sign legal documents. ? Take care of children on your own.  Rest. Eating and drinking  Follow the diet recommended by your health care provider.  If you vomit: ? Drink water, juice, or soup when you can drink without  vomiting. ? Make sure you have little or no nausea before eating solid foods. General instructions  Have a responsible adult stay with you until you are awake and alert.  Take over-the-counter and prescription medicines only as told by your health care provider.  If you smoke, do not smoke without supervision.  Keep all follow-up visits as told by your health care provider. This is important. Contact a health care provider if:  You keep feeling nauseous or you keep vomiting.  You feel light-headed.  You develop a rash.  You have a fever. Get help right away if:  You have trouble breathing. This information is not intended to replace advice given to you by your health care provider. Make sure you discuss any questions you have with your health care provider. Document Released: 05/17/2013 Document Revised: 12/30/2015 Document Reviewed: 11/16/2015 Elsevier Interactive Patient Education  [employer].

## 2017-03-04 ENCOUNTER — Encounter (HOSPITAL_COMMUNITY): Payer: Self-pay | Admitting: Interventional Radiology

## 2017-03-04 ENCOUNTER — Telehealth: Payer: Self-pay

## 2017-03-04 NOTE — Telephone Encounter (Signed)
Wife called asking if they need to keep the 8/10 appts.   Wife needs Dr Burr Medico to sign the HCPOA last page attesting to pt's incapacity. And a copy returned to her.  Does he need to keep taking sandostatin. Wife is saying hospice did not approve this medication.    Wife is asking who will produce the DNR, hospice or Dr Burr Medico. Explained that hospice will do the DNR.

## 2017-03-04 NOTE — Telephone Encounter (Signed)
Please let pt's wife know that he does not need keep the 8/10 appointment, and we will cancel his injection also. If she has any concerns, please call back. Thanks   Truitt Merle MD

## 2017-03-08 ENCOUNTER — Telehealth: Payer: Self-pay | Admitting: *Deleted

## 2017-03-08 NOTE — Telephone Encounter (Signed)
Message to Dr Burr Medico.

## 2017-03-08 NOTE — Telephone Encounter (Signed)
I called pt's Beather Arbour back, expressed my condolence. Pt was enrolled to hospice before he passed away. She is quite tearful, stressed, but appreciated the call.   Truitt Merle MD

## 2017-03-08 NOTE — Telephone Encounter (Signed)
Tearful voice mail from spouse Rod Holler reporting "Jimmie died Nov 10, 2022 at 5:13 pm.  Thanks for all your help."  Return number [phone].

## 2017-03-10 DEATH — deceased

## 2017-03-13 LAB — 5 HIAA, QUANTITATIVE, URINE, 24 HOUR
5-HIAA, URINE: 290 mg/L
5-HIAA,QUANT.,24 HR URINE: 87 mg/(24.h) — AB (ref 0.0–14.9)

## 2017-03-15 ENCOUNTER — Other Ambulatory Visit: Payer: Self-pay | Admitting: Internal Medicine

## 2017-03-19 ENCOUNTER — Other Ambulatory Visit: Payer: Commercial Managed Care - PPO

## 2017-03-19 ENCOUNTER — Ambulatory Visit: Payer: Commercial Managed Care - PPO | Admitting: Hematology

## 2017-03-19 ENCOUNTER — Ambulatory Visit: Payer: Commercial Managed Care - PPO

## 2017-03-24 ENCOUNTER — Other Ambulatory Visit: Payer: Self-pay | Admitting: Nurse Practitioner

## 2017-07-20 IMAGING — CT NM PET NOPR SKULL BASE TO THIGH
8 series · 25 of 25 positions shown · non-contrast
Comparison: DOTATATE PET scan 08/20/2016

CLINICAL DATA: Metastatic neuroendocrine tumor. Subsequent
treatment evaluation.

EXAM:
NUCLEAR MEDICINE PET SKULL BASE TO THIGH
TECHNIQUE: 4.1 mCi Ga 68 DOTATATE was injected intravenously. Full-ring PET
imaging was performed from the skull base to thigh after the
radiotracer. CT data was obtained and used for attenuation
correction and anatomic localization.

[Series 3: pet sk_thigh ac · axial · 5.0mm · 4.07mm/px · z∈[-540,+336]mm · 5 of 220 slices shown]
[im 1/220]
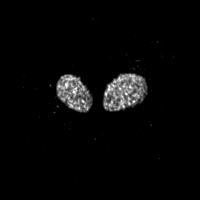
[im 55/220]
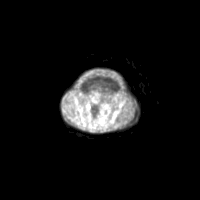
[im 110/220]
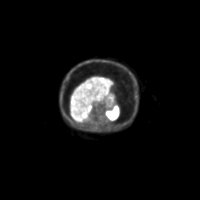
[im 165/220]
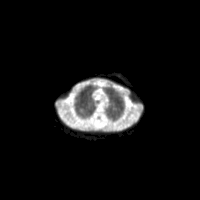
[im 220/220]
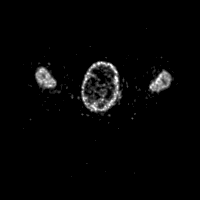

[Series 4: ct sk_thigh 5.0 b31f · axial · 5.0mm · 0.98mm/px · z∈[-540,+336]mm · 5 of 220 slices shown]
[im 1/220]
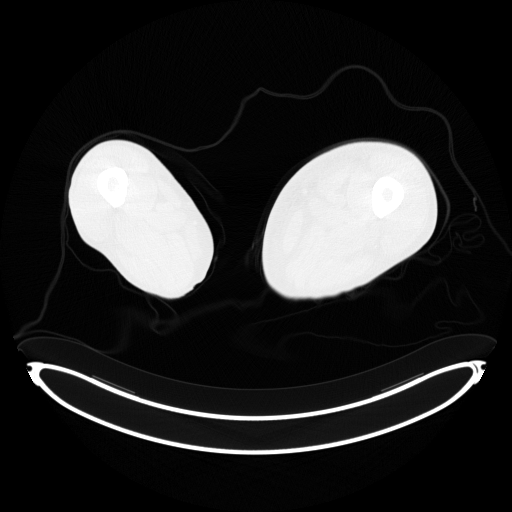
[im 55/220]
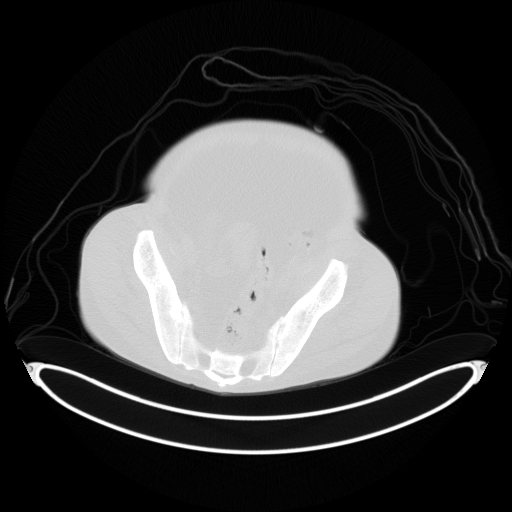
[im 110/220]
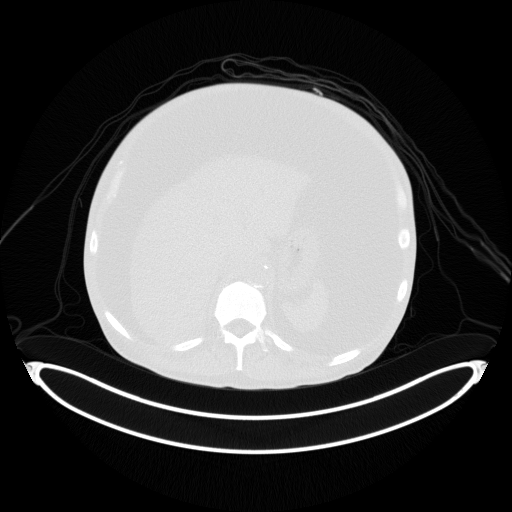
[im 165/220]
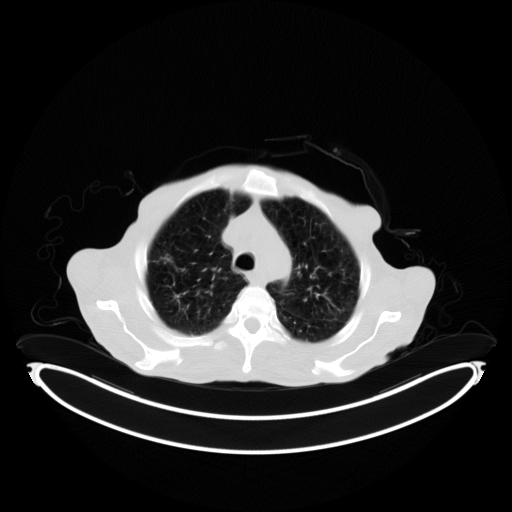
[im 220/220  brain]
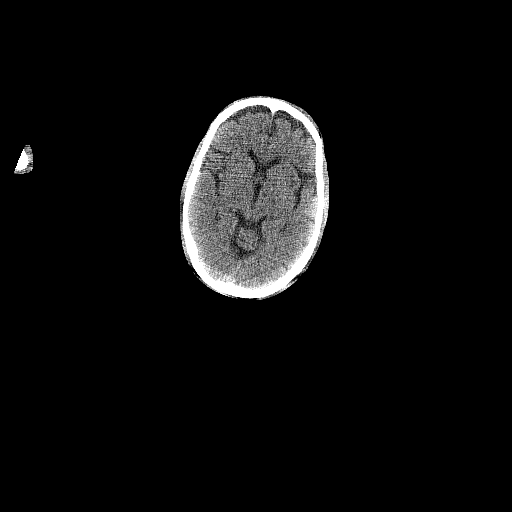

[Series 7: ct sk_thigh 5.0 b70f lung_bone · axial · 5.0mm · 0.71mm/px · z∈[-112,+180]mm · 2 of 74 slices shown]
[im 1/74  bone]
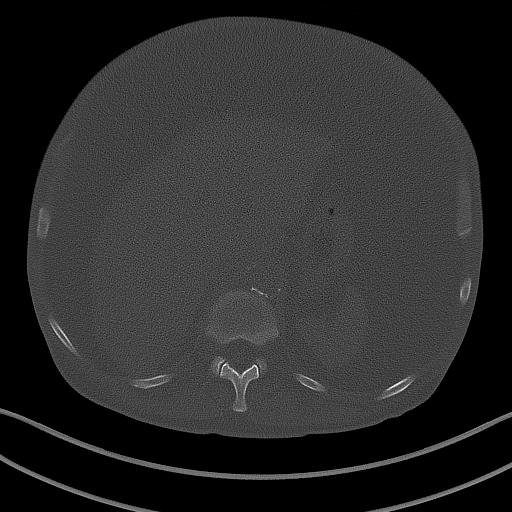
[im 74/74  bone]
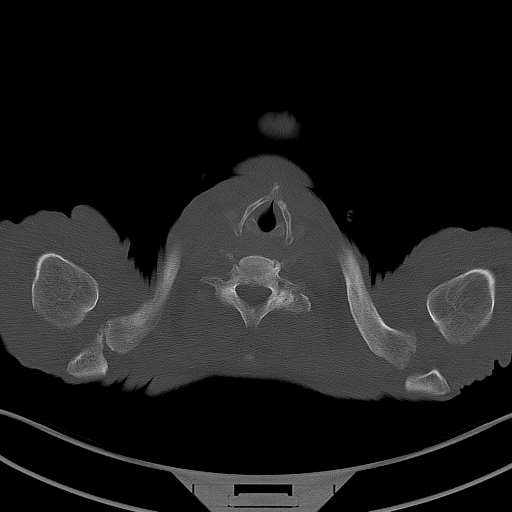

[Series 8: pet sk_thigh nac · axial · 5.0mm · 4.07mm/px · z∈[-540,+336]mm · 5 of 220 slices shown]
[im 1/220]
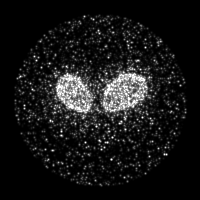
[im 55/220]
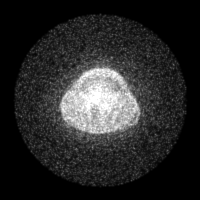
[im 110/220]
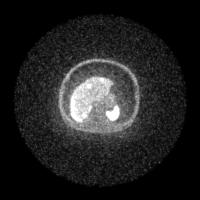
[im 165/220]
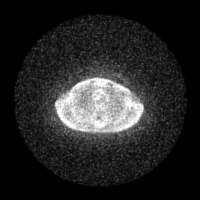
[im 220/220]
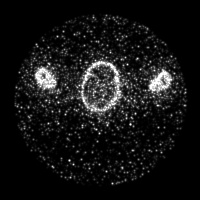

[Series 604: mip collection · coronal · 1.82mm/px · 1 of 32 slices shown]
[im 1/32]
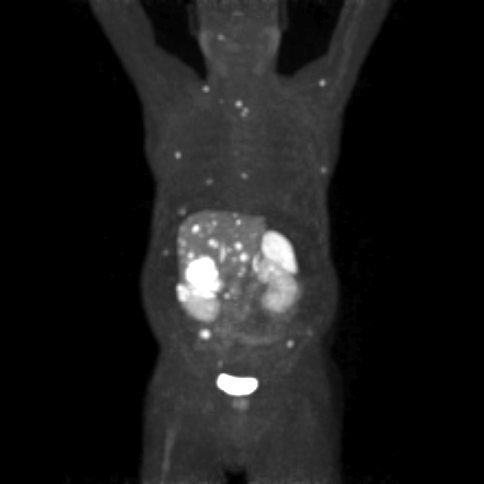

[Series 606: range-ct sk_thigh 5.0 (id)<alpha range(1)> · 1 of 41 slices shown]
[im 1/41]
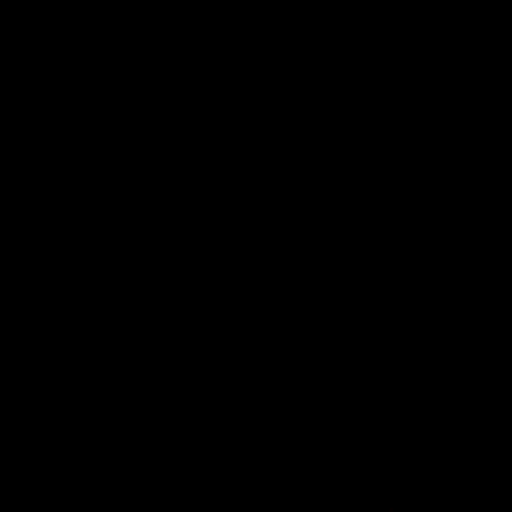

[Series 607: range-ct sk_thigh 5.0 (id)<alpha range> · 5 of 210 slices shown]
[im 1/210]
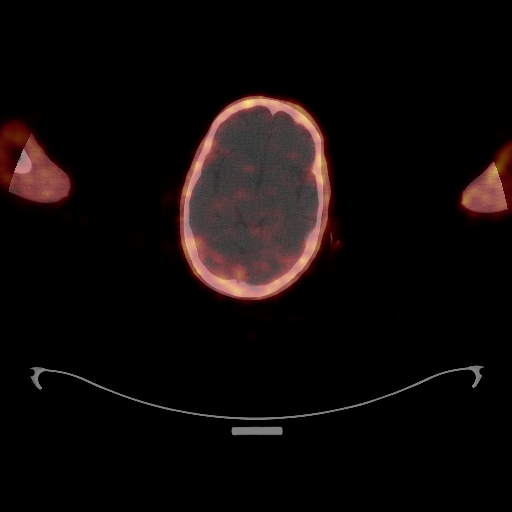
[im 53/210]
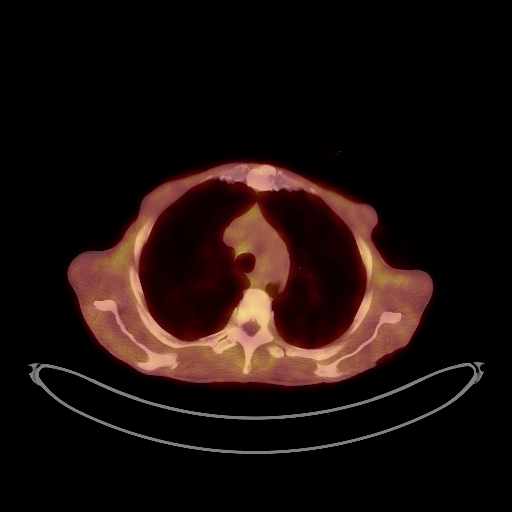
[im 105/210]
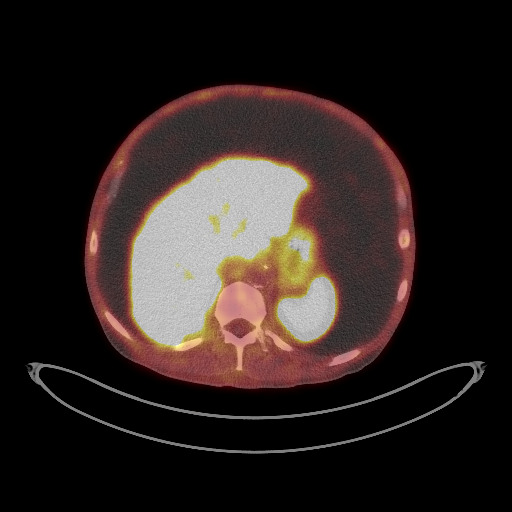
[im 157/210]
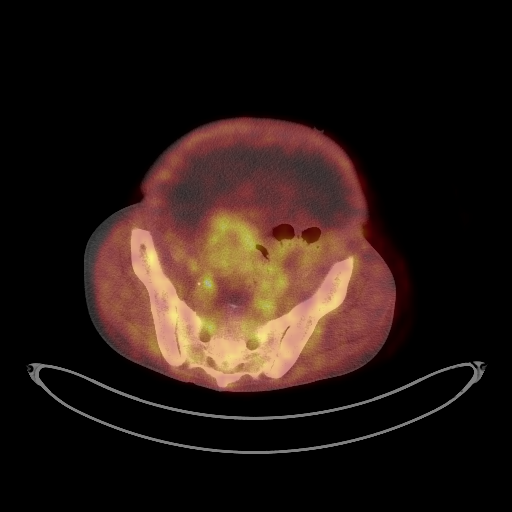
[im 210/210]
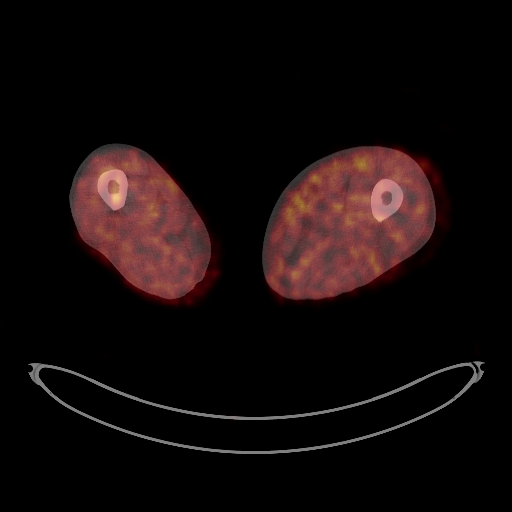

[Series 1182: results mm oncology reading · 1.26mm/px · 1 of 6 slices shown]
[im 1/6]
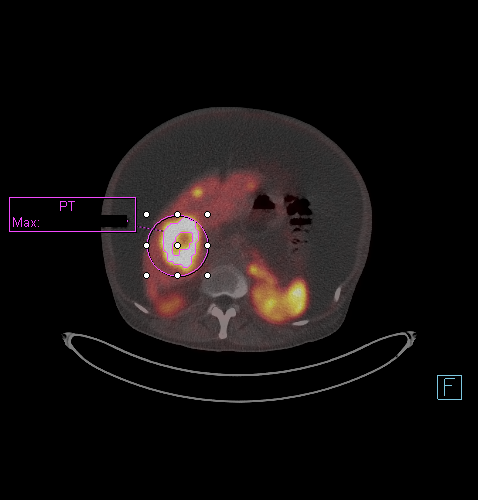

[25 of 25 positions shown; findings below may reference images not displayed]

FINDINGS: NECK

No radiotracer activity in neck lymph nodes.

CHEST

No radiotracer accumulation within mediastinal or hilar lymph nodes.
No suspicious pulmonary nodules on the CT scan.

ABDOMEN/PELVIS

Again demonstrated multiple lesions within the liver which have
intense radiotracer activity. Dominant lesion the RIGHT hepatic lobe
with SUV max equal 64 compares to SUV max 56. Lesion is similar
size. Approximately 12 additional lesions within liver. No new
lesions identified. Example lesion in the anterior LEFT hepatic lobe
with SUV max equal 33 compared with SUV max equal 30.

Again demonstratesd several radiotracer avid lymph nodes in the
porta hepatis. Moderate metabolic activity within the pancreas may
be physiologic.

Focal activity the terminal ileum with SUV max equal 33 compares
with SUV max equal 24.

There is new large volume ascites within the abdomen and pelvis. No
clear evidence of peritoneal metastatic disease.

Physiologic activity noted in the liver, spleen, adrenal glands and
kidneys.

SKELETON

Multiple small foci of discrete radiotracer activity within the
skeleton with minimal increase in radiotracer activity compared to
prior. For example lesion in the manubrium with SUV max equal 18
compared to SUV max equal 15.
IMPRESSION: 1. Mild increase in radiotracer activity within hepatic lesions,
upper abdominal lymph nodes, terminal ileum lesion, and skeletal
metastasis.
2. No new lesions are identified.
3. New large volume ascites. No evidence of peritoneal
carcinomatosis.

## 2017-09-13 IMAGING — US IR PERC TUN PERIT CATH WO PORT S&I /IMAG
1 series · 1 of 1 positions shown · non-contrast
Comparison: none

INDICATION: Metastatic neuroendocrine carcinoma refractory malignant ascites
requiring multiple paracentesis procedures. Request for tunneled
peritoneal drainage catheter for palliation.

[Series 1: ir (id) (id)/(id)/(id) ir · 1 of 1 slices shown]
[im 1/1]
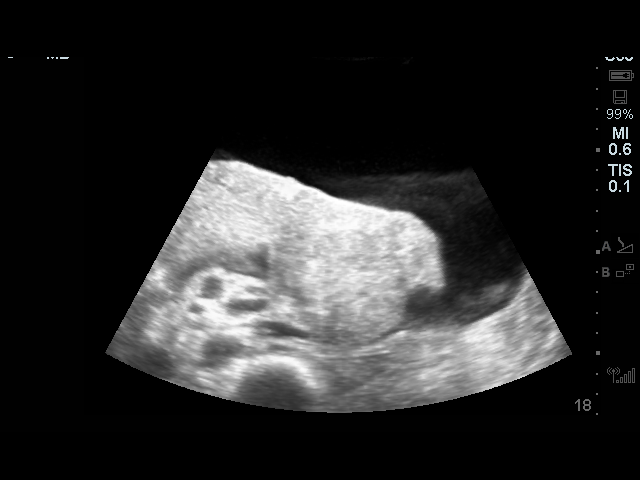

[1 of 1 positions shown; findings below may reference images not displayed]

EXAM:
IR PERC CLAUDIANI PERIT CATH WITHOUT PORT S+I WITH IMAGE

MEDICATIONS:
2 g IV Ancef. As antibiotic prophylaxis, Ancef was ordered
pre-procedure and administered intravenously within one hour of
incision.

ANESTHESIA/SEDATION:
Fentanyl 50 mcg IV; Versed 1.0 mg IV

Moderate Sedation Time:  43 minutes

The patient was continuously monitored during the procedure by the
interventional radiology nurse under my direct supervision.

FLUOROSCOPY TIME:  Less than 6 seconds.  0.0 mGy.

COMPLICATIONS:
None immediate.

PROCEDURE:
Informed written consent was obtained from the patient after a
thorough discussion of the procedural risks, benefits and
alternatives. All questions were addressed. Maximal Sterile Barrier
Technique was utilized including caps, mask, sterile gowns, sterile
gloves, sterile drape, hand hygiene and skin antiseptic. A timeout
was performed prior to the initiation of the procedure.

Ultrasound was utilized to localize ascites. The right abdominal
wall was prepped with chlorhexidine. Local anesthesia was provided
with 1% lidocaine. After creating a small skin incision, a 19 gauge
needle was advanced into the peritoneal cavity. A guidewire was then
advanced through the needle to secure access. Access was then
dilated and a 16 French peel-away sheath advanced over the wire.

A subcutaneous tunnel measuring approximately 5 cm was then created
from the right lower quadrant abdominal wall to the peritoneal
access peel-away sheath site. A 15.5 French peritoneal PleurX
tunneled catheter was then advanced through the subcutaneous tunnel.
The catheter was fed through the peel-away sheath and the peel-away
sheath removed. Large volume paracentesis was then begun through the
catheter. Fluoroscopy confirmed catheter course.

The peritoneal access incision was closed with subcutaneous 3-0
Monocryl and subcuticular 4-0 Vicryl. Dermabond was applied over the
incision. The PleurX exit site was secured with a Prolene retention
suture.
FINDINGS: After placement of the peritoneal drainage catheter, catheter course
is transverse from the right abdominal wall towards the left side of
the peritoneal cavity. There was good return of ascites with ability
to drain 8 L of fluid after catheter placement.
IMPRESSION: Placement of tunneled peritoneal drainage catheter with removal of 8
L of ascites after catheter placement.

## 2018-02-16 ENCOUNTER — Encounter: Payer: Self-pay | Admitting: Internal Medicine
